# Patient Record
Sex: Male | Born: 1955 | Race: White | Hispanic: No | Marital: Married | Smoking: Current every day smoker
Health system: Southern US, Community
[De-identification: ages and names within clinical notes are randomized; demographics above are authoritative.]

## PROBLEM LIST (undated history)

## (undated) DIAGNOSIS — M1990 Unspecified osteoarthritis, unspecified site: Secondary | ICD-10-CM

## (undated) DIAGNOSIS — F419 Anxiety disorder, unspecified: Secondary | ICD-10-CM

## (undated) DIAGNOSIS — I10 Essential (primary) hypertension: Secondary | ICD-10-CM

## (undated) DIAGNOSIS — R002 Palpitations: Secondary | ICD-10-CM

## (undated) DIAGNOSIS — Z8619 Personal history of other infectious and parasitic diseases: Secondary | ICD-10-CM

## (undated) DIAGNOSIS — R079 Chest pain, unspecified: Secondary | ICD-10-CM

## (undated) HISTORY — DX: Anxiety disorder, unspecified: F41.9

## (undated) HISTORY — DX: Essential (primary) hypertension: I10

## (undated) HISTORY — DX: Palpitations: R00.2

## (undated) HISTORY — DX: Personal history of other infectious and parasitic diseases: Z86.19

## (undated) HISTORY — DX: Chest pain, unspecified: R07.9

## (undated) HISTORY — DX: Unspecified osteoarthritis, unspecified site: M19.90

---

## 2003-02-15 HISTORY — PX: CHOLECYSTECTOMY: SHX55

## 2012-02-12 ENCOUNTER — Encounter: Payer: Self-pay | Admitting: Cardiology

## 2012-02-16 ENCOUNTER — Telehealth: Payer: Self-pay | Admitting: *Deleted

## 2012-02-16 ENCOUNTER — Encounter: Payer: Self-pay | Admitting: *Deleted

## 2012-02-16 ENCOUNTER — Encounter: Payer: Self-pay | Admitting: Cardiology

## 2012-02-16 ENCOUNTER — Ambulatory Visit (INDEPENDENT_AMBULATORY_CARE_PROVIDER_SITE_OTHER): Payer: Commercial Indemnity | Admitting: Cardiology

## 2012-02-16 VITALS — BP 146/90 | HR 80 | Ht 72.0 in | Wt 211.0 lb

## 2012-02-16 DIAGNOSIS — I10 Essential (primary) hypertension: Secondary | ICD-10-CM

## 2012-02-16 DIAGNOSIS — R079 Chest pain, unspecified: Secondary | ICD-10-CM | POA: Insufficient documentation

## 2012-02-16 DIAGNOSIS — R002 Palpitations: Secondary | ICD-10-CM

## 2012-02-16 DIAGNOSIS — R7989 Other specified abnormal findings of blood chemistry: Secondary | ICD-10-CM

## 2012-02-16 MED ORDER — METOPROLOL SUCCINATE ER 25 MG PO TB24: 25.0000 mg | ORAL_TABLET | Freq: Every day | ORAL | Status: DC

## 2012-02-16 MED ORDER — ASPIRIN EC 81 MG PO TBEC: 81.0000 mg | DELAYED_RELEASE_TABLET | Freq: Every day | ORAL | Status: DC

## 2012-02-16 NOTE — Progress Notes (Signed)
  HPI: 57 year old male for evaluation of palpitations and near syncope. Seen in the emergency room on December 29 following an episode. At that time electrocardiogram was normal. Drug screen negative. Potassium 4.2. BUN and creatinine normal. SGOT 55 and SGPT 75. Patient states he was driving and suddenly felt lightheaded. He then felt his heart racing and some nausea. No dyspnea or chest pain. He states he felt as though he may pass out. EMS was called after he pulled over and he was taken to the hospital. His symptoms had resolved by the time he arrived. He had one similar episode 9 years ago. He otherwise denies dyspnea on exertion, orthopnea, PND, pedal edema or history of syncope. He does state that for the past 2 years he notices chest tightness after walking approximately 1 mile relieved with rest.  Current Outpatient Prescriptions  Medication Sig Dispense Refill  . clonazePAM (KLONOPIN) 0.5 MG tablet Take 0.5 mg by mouth 2 (two) times daily.        No Known Allergies  Past Medical History  Diagnosis Date  . Hypertension     Past Surgical History  Procedure Date  . Cholecystectomy     History   Social History  . Marital Status: Married    Spouse Name: N/A    Number of Children: 2  . Years of Education: N/A   Occupational History  . UNEMPLOYED    Social History Main Topics  . Smoking status: Former Smoker -- 1.5 packs/day for 30 years    Types: Cigarettes    Quit date: 02/14/2002  . Smokeless tobacco: Not on file  . Alcohol Use: Yes     Comment: Rare  . Drug Use: No  . Sexually Active: Not on file   Other Topics Concern  . Not on file   Social History Narrative  . No narrative on file    Family History  Problem Relation Age of Onset  . Heart disease      No family history    ROS: no fevers or chills, productive cough, hemoptysis, dysphasia, odynophagia, melena, hematochezia, dysuria, hematuria, rash, seizure activity, orthopnea, PND, pedal edema,  claudication. Remaining systems are negative.  Physical Exam:   Blood pressure 146/90, pulse 80, height 6' (1.829 m), weight 211 lb (95.709 kg), SpO2 98.00%.  General:  Well developed/well nourished in NAD Skin warm/dry Patient not depressed No peripheral clubbing Back-normal HEENT-normal/normal eyelids Neck supple/normal carotid upstroke bilaterally; no bruits; no JVD; no thyromegaly chest - CTA/ normal expansion CV - RRR/normal S1 and S2; no murmurs, rubs or gallops;  PMI nondisplaced Abdomen -NT/ND, no HSM, no mass, + bowel sounds, no bruit 2+ femoral pulses, no bruits Ext-no edema, chords, 2+ DP Neuro-grossly nonfocal  ECG 02/12/2012-sinus rhythm with no ST changes.

## 2012-02-16 NOTE — Patient Instructions (Signed)
   Echo   Stress Echo  Office will contact with results   Begin Aspirin 81mg  daily  Begin Toprol XL 25mg  daily  Lab for TSH (thyroid)  Need to follow up with primary MD regarding elevated liver function  Follow up in  6-8 weeks

## 2012-02-16 NOTE — Assessment & Plan Note (Signed)
Plan echocardiogram and TSH. I discussed a CardioNet the patient declined. We will consider this in the future if he has recurrent episodes.

## 2012-02-16 NOTE — Assessment & Plan Note (Signed)
Blood pressure is mildly elevated. Add Toprol 25 mg daily for blood pressure and palpitations.

## 2012-02-16 NOTE — Telephone Encounter (Signed)
Patient seen in office today.  Wants to know if we would refill his Clonazepam.  Was given 0.5mg  twice a day  - # 6 given at ED.  States this med helped him relax and sleep.  Does not currently have a PMD.

## 2012-02-16 NOTE — Assessment & Plan Note (Signed)
Patient encouraged to followup with primary care concerning this issue.

## 2012-02-16 NOTE — Assessment & Plan Note (Signed)
Patient describes some chest pain with ambulating after 1 mile. Symptoms are concerning for coronary disease but had been present for approximately 2 years. I have asked him to take aspirin 81 mg daily. We will arrange a stress echocardiogram for risk stratification.

## 2012-02-16 NOTE — Telephone Encounter (Signed)
Patient needs primary care physician and should get this med from him Olga Millers

## 2012-02-17 ENCOUNTER — Other Ambulatory Visit: Payer: Self-pay | Admitting: *Deleted

## 2012-02-17 ENCOUNTER — Telehealth: Payer: Self-pay | Admitting: Cardiology

## 2012-02-17 DIAGNOSIS — R079 Chest pain, unspecified: Secondary | ICD-10-CM

## 2012-02-17 NOTE — Telephone Encounter (Signed)
Pt has [employer].  No precert for stress echo.

## 2012-02-17 NOTE — Telephone Encounter (Signed)
Patient notified

## 2012-02-17 NOTE — Telephone Encounter (Signed)
Stress Echo set for 03-05-2012 [facility] Checking percert

## 2012-02-17 NOTE — Telephone Encounter (Signed)
Left message to return call 

## 2012-02-20 ENCOUNTER — Other Ambulatory Visit: Payer: Self-pay | Admitting: *Deleted

## 2012-02-20 ENCOUNTER — Encounter: Payer: Self-pay | Admitting: *Deleted

## 2012-02-20 DIAGNOSIS — R002 Palpitations: Secondary | ICD-10-CM

## 2012-02-23 DIAGNOSIS — R002 Palpitations: Secondary | ICD-10-CM

## 2012-03-01 ENCOUNTER — Other Ambulatory Visit (INDEPENDENT_AMBULATORY_CARE_PROVIDER_SITE_OTHER): Payer: Commercial Indemnity

## 2012-03-01 ENCOUNTER — Other Ambulatory Visit: Payer: Self-pay

## 2012-03-01 DIAGNOSIS — R002 Palpitations: Secondary | ICD-10-CM

## 2012-03-01 DIAGNOSIS — R079 Chest pain, unspecified: Secondary | ICD-10-CM

## 2012-03-02 ENCOUNTER — Telehealth: Payer: Self-pay | Admitting: *Deleted

## 2012-03-02 NOTE — Telephone Encounter (Signed)
Notes Recorded by Lesle Chris, LPN on 07/19/5407 at 11:31 AM Patient notified.

## 2012-03-02 NOTE — Telephone Encounter (Signed)
Message copied by Lesle Chris on Fri Mar 02, 2012 11:31 AM ------      Message from: Lewayne Bunting      Created: Fri Mar 02, 2012  7:41 AM       Hazle Coca

## 2012-03-05 DIAGNOSIS — R079 Chest pain, unspecified: Secondary | ICD-10-CM

## 2012-03-06 ENCOUNTER — Encounter: Payer: Self-pay | Admitting: *Deleted

## 2012-03-15 ENCOUNTER — Ambulatory Visit (INDEPENDENT_AMBULATORY_CARE_PROVIDER_SITE_OTHER): Payer: Commercial Indemnity | Admitting: Physician Assistant

## 2012-03-15 ENCOUNTER — Encounter: Payer: Self-pay | Admitting: Physician Assistant

## 2012-03-15 VITALS — BP 136/78 | HR 80 | Ht 72.0 in | Wt 213.1 lb

## 2012-03-15 DIAGNOSIS — I10 Essential (primary) hypertension: Secondary | ICD-10-CM

## 2012-03-15 DIAGNOSIS — R079 Chest pain, unspecified: Secondary | ICD-10-CM

## 2012-03-15 DIAGNOSIS — R002 Palpitations: Secondary | ICD-10-CM

## 2012-03-15 MED ORDER — METOPROLOL SUCCINATE ER 25 MG PO TB24: 25.0000 mg | ORAL_TABLET | Freq: Two times a day (BID) | ORAL | Status: DC

## 2012-03-15 NOTE — Assessment & Plan Note (Signed)
Will followup on recently completed 21 day event monitor, and await the final summary report. We reviewed some recorded strips, all indicating NSR with no dysrhythmia. If the rest of the study is also negative, then no further workup is indicated and patient will return to our clinic on an as-needed basis. Meanwhile, I recommended increasing current Toprol dose to 25 twice a day, given that he was on this medication on the day of admission, when he had a particularly intense episode of sustained palpitations.

## 2012-03-15 NOTE — Assessment & Plan Note (Signed)
Improved since last OV, following addition of beta blocker.

## 2012-03-15 NOTE — Assessment & Plan Note (Signed)
No further workup indicated. Patient has had recent extensive noninvasive workup, consisting of baseline echocardiogram and an exercise stress echocardiogram, both normal. Moreover, he  had normal cardiac markers during recent brief hospitalization.

## 2012-03-15 NOTE — Progress Notes (Signed)
Primary Cardiologist: Olga Millers, MD    HPI: Patient presents for scheduled followup and review of recent diagnostic studies, per Dr. Jens Som, for evaluation of palpitations, near syncope, and chest tightness. He declined recommendation to wear an event monitor.   - 2-D echocardiogram: EF 66%; no WMAs   - Normal adequate GXT echocardiogram   - NL TSH  Since his recent OV, patient was briefly hospitalized at [facility] for symptomatic palpitations. We were called for formal consultation, and noted no documented dysrhythmia. Patient also reported associated near-syncope and CP, and ruled out for MI. He also agreed to proceed with our initial recommendation for a 21 day event monitor. He just completed this yesterday.  No Known Allergies  Current Outpatient Prescriptions  Medication Sig Dispense Refill  . aspirin EC 81 MG tablet Take 1 tablet (81 mg total) by mouth daily.      . clonazePAM (KLONOPIN) 0.5 MG tablet Take 0.5 mg by mouth 2 (two) times daily.      . metoprolol succinate (TOPROL-XL) 25 MG 24 hr tablet Take 1 tablet (25 mg total) by mouth daily.  30 tablet  6    Past Medical History  Diagnosis Date  . Hypertension   . Palpitations   . Chest pain     Past Surgical History  Procedure Date  . Cholecystectomy     History   Social History  . Marital Status: Married    Spouse Name: N/A    Number of Children: 2  . Years of Education: N/A   Occupational History  . UNEMPLOYED    Social History Main Topics  . Smoking status: Former Smoker -- 1.5 packs/day for 30 years    Types: Cigarettes    Quit date: 02/14/2002  . Smokeless tobacco: Not on file  . Alcohol Use: Yes     Comment: Rare  . Drug Use: No  . Sexually Active: Not on file   Other Topics Concern  . Not on file   Social History Narrative  . No narrative on file    Family History  Problem Relation Age of Onset  . Heart disease      No family history    ROS: no nausea, vomiting; no fever, chills;  no melena, hematochezia; no claudication  PHYSICAL EXAM: BP 136/78  Pulse 80  Ht 6' (1.829 m)  Wt 213 lb 1.9 oz (96.671 kg)  BMI 28.90 kg/m2  SpO2 99% GENERAL: 57 year old male; NAD HEENT: NCAT, PERRLA, EOMI; sclera clear; no xanthelasma NECK: palpable bilateral carotid pulses, no bruits; no JVD; no TM LUNGS: CTA bilaterally CARDIAC: RRR (S1, S2); no significant murmurs; no rubs or gallops ABDOMEN: soft, non-tender; intact BS EXTREMETIES: intact distal pulses; no significant peripheral edema SKIN: warm/dry; no obvious rash/lesions MUSCULOSKELETAL: no joint deformity NEURO: no focal deficit; NL affect   EKG:    ASSESSMENT & PLAN:  Palpitations Will followup on recently completed 21 day event monitor, and await the final summary report. We reviewed some recorded strips, all indicating NSR with no dysrhythmia. If the rest of the study is also negative, then no further workup is indicated and patient will return to our clinic on an as-needed basis. Meanwhile, I recommended increasing current Toprol dose to 25 twice a day, given that he was on this medication on the day of admission, when he had a particularly intense episode of sustained palpitations.  Chest pain No further workup indicated. Patient has had recent extensive noninvasive workup, consisting of baseline echocardiogram and an exercise  stress echocardiogram, both normal. Moreover, he  had normal cardiac markers during recent brief hospitalization.  Essential hypertension Improved since last OV, following addition of beta blocker.    Gene Aleece Loyd, PAC

## 2012-03-15 NOTE — Patient Instructions (Signed)
   Increase Toprol XL to 25mg  twice a day   Continue all other current medications. Follow up as needed

## 2012-03-23 ENCOUNTER — Telehealth: Payer: Self-pay | Admitting: Physician Assistant

## 2012-03-23 NOTE — Telephone Encounter (Signed)
Discussed below with wife.  States that he has not been feeling good since recent increase on his Toprol XL 25mg  twice a day.  Nausea x last 4 days, elevated BP, and c/o heart feeling like it is flipping.  BP while on the phone - 211/97  78.  Today he has been clammy, pale, and just c/o not feeling right.  Advised her to take to ED for evaluation as acute symptoms should not be handled in the office.  She verbalized understanding.

## 2012-03-23 NOTE — Telephone Encounter (Signed)
Mr. Diesel thinks his heart is fluttering and states that he has been nausea.

## 2012-07-18 ENCOUNTER — Ambulatory Visit: Payer: Self-pay | Admitting: Nurse Practitioner

## 2012-08-24 ENCOUNTER — Encounter: Payer: Self-pay | Admitting: Nurse Practitioner

## 2012-08-24 ENCOUNTER — Ambulatory Visit (INDEPENDENT_AMBULATORY_CARE_PROVIDER_SITE_OTHER): Payer: Managed Care, Other (non HMO) | Admitting: Nurse Practitioner

## 2012-08-24 VITALS — BP 173/85 | HR 71 | Temp 97.4°F | Ht 72.0 in | Wt 208.0 lb

## 2012-08-24 DIAGNOSIS — R5383 Other fatigue: Secondary | ICD-10-CM

## 2012-08-24 DIAGNOSIS — I10 Essential (primary) hypertension: Secondary | ICD-10-CM

## 2012-08-24 DIAGNOSIS — B353 Tinea pedis: Secondary | ICD-10-CM

## 2012-08-24 DIAGNOSIS — R5381 Other malaise: Secondary | ICD-10-CM

## 2012-08-24 DIAGNOSIS — F410 Panic disorder [episodic paroxysmal anxiety] without agoraphobia: Secondary | ICD-10-CM

## 2012-08-24 MED ORDER — CLOTRIMAZOLE 1 % EX CREA: TOPICAL_CREAM | Freq: Two times a day (BID) | CUTANEOUS | Status: DC

## 2012-08-24 MED ORDER — METOPROLOL SUCCINATE ER 25 MG PO TB24: 25.0000 mg | ORAL_TABLET | Freq: Two times a day (BID) | ORAL | Status: DC

## 2012-08-24 MED ORDER — LISINOPRIL 40 MG PO TABS: 40.0000 mg | ORAL_TABLET | Freq: Every day | ORAL | Status: DC

## 2012-08-24 MED ORDER — CLONAZEPAM 0.5 MG PO TABS: 0.5000 mg | ORAL_TABLET | Freq: Two times a day (BID) | ORAL | Status: DC

## 2012-08-24 NOTE — Progress Notes (Signed)
  Subjective:    Patient ID: Stephen Fuller, male    DOB: [birth year], 56 y.o.   MRN: [id number]  Hypertension This is a chronic problem. The current episode started more than 1 year ago. The problem has been waxing and waning since onset. The problem is uncontrolled. Pertinent negatives include no blurred vision, chest pain, headaches, malaise/fatigue, orthopnea, palpitations, peripheral edema or shortness of breath. There are no associated agents to hypertension. Risk factors for coronary artery disease include obesity. Past treatments include beta blockers. The current treatment provides no improvement. Compliance problems include diet.    Panic attack Klonopin takes BID - says can't go without it.  Patient C/O fatigue  Review of Systems  Constitutional: Negative for malaise/fatigue.  Eyes: Negative for blurred vision.  Respiratory: Negative for shortness of breath.   Cardiovascular: Negative for chest pain, palpitations and orthopnea.  Neurological: Negative for headaches.  All other systems reviewed and are negative.       Objective:   Physical Exam  Constitutional: He is oriented to person, place, and time. He appears well-developed and well-nourished.  HENT:  Head: Normocephalic.  Right Ear: External ear normal.  Left Ear: External ear normal.  Nose: Nose normal.  Mouth/Throat: Oropharynx is clear and moist.  Eyes: EOM are normal. Pupils are equal, round, and reactive to light.  Neck: Normal range of motion. Neck supple. No thyromegaly present.  Cardiovascular: Normal rate, regular rhythm, normal heart sounds and intact distal pulses.   No murmur heard. Pulmonary/Chest: Effort normal and breath sounds normal. He has no wheezes. He has no rales.  Abdominal: Soft. Bowel sounds are normal.  Musculoskeletal: Normal range of motion.  Neurological: He is alert and oriented to person, place, and time.  Skin: Skin is warm and dry.  Erythematous cracked areas between toes bil  feet  Psychiatric: He has a normal mood and affect. His behavior is normal. Judgment and thought content normal.    BP 173/85  Pulse 71  Temp(Src) 97.4 F (36.3 C) (Oral)  Ht 6' (1.829 m)  Wt 208 lb (94.348 kg)  BMI 28.2 kg/m2       Assessment & Plan:  1. Hypertension Low Na= diet Added lisinopril to meds - metoprolol succinate (TOPROL-XL) 25 MG 24 hr tablet; Take 1 tablet (25 mg total) by mouth 2 (two) times daily.  Dispense: 60 tablet; Refill: 5 - lisinopril (PRINIVIL,ZESTRIL) 40 MG tablet; Take 1 tablet (40 mg total) by mouth daily.  Dispense: 30 tablet; Refill: 3  2. Panic attacks Stress management - clonazePAM (KLONOPIN) 0.5 MG tablet; Take 1 tablet (0.5 mg total) by mouth 2 (two) times daily.  Dispense: 60 tablet; Refill: 1  3. Tinea pedis Keep feet clean and dry Change socks when become moist - clotrimazole (LOTRIMIN) 1 % cream; Apply topically 2 (two) times daily.  Dispense: 30 g; Refill: 0  Mary-Margaret Daphine Deutscher, FNP

## 2012-08-24 NOTE — Patient Instructions (Signed)
Health Maintenance, Males A healthy lifestyle and preventative care can promote health and wellness.  Maintain regular health, dental, and eye exams.  Eat a healthy diet. Foods like vegetables, fruits, whole grains, low-fat dairy products, and lean protein foods contain the nutrients you need without too many calories. Decrease your intake of foods high in solid fats, added sugars, and salt. Get information about a proper diet from your caregiver, if necessary.  Regular physical exercise is one of the most important things you can do for your health. Most adults should get at least 150 minutes of moderate-intensity exercise (any activity that increases your heart rate and causes you to sweat) each week. In addition, most adults need muscle-strengthening exercises on 2 or more days a week.   Maintain a healthy weight. The body mass index (BMI) is a screening tool to identify possible weight problems. It provides an estimate of body fat based on height and weight. Your caregiver can help determine your BMI, and can help you achieve or maintain a healthy weight. For adults 20 years and older:  A BMI below 18.5 is considered underweight.  A BMI of 18.5 to 24.9 is normal.  A BMI of 25 to 29.9 is considered overweight.  A BMI of 30 and above is considered obese.  Maintain normal blood lipids and cholesterol by exercising and minimizing your intake of saturated fat. Eat a balanced diet with plenty of fruits and vegetables. Blood tests for lipids and cholesterol should begin at age 20 and be repeated every 5 years. If your lipid or cholesterol levels are high, you are over 50, or you are a high risk for heart disease, you may need your cholesterol levels checked more frequently.Ongoing high lipid and cholesterol levels should be treated with medicines, if diet and exercise are not effective.  If you smoke, find out from your caregiver how to quit. If you do not use tobacco, do not start.  If you  choose to drink alcohol, do not exceed 2 drinks per day. One drink is considered to be 12 ounces (355 mL) of beer, 5 ounces (148 mL) of wine, or 1.5 ounces (44 mL) of liquor.  Avoid use of street drugs. Do not share needles with anyone. Ask for help if you need support or instructions about stopping the use of drugs.  High blood pressure causes heart disease and increases the risk of stroke. Blood pressure should be checked at least every 1 to 2 years. Ongoing high blood pressure should be treated with medicines if weight loss and exercise are not effective.  If you are 45 to 56 years old, ask your caregiver if you should take aspirin to prevent heart disease.  Diabetes screening involves taking a blood sample to check your fasting blood sugar level. This should be done once every 3 years, after age 45, if you are within normal weight and without risk factors for diabetes. Testing should be considered at a younger age or be carried out more frequently if you are overweight and have at least 1 risk factor for diabetes.  Colorectal cancer can be detected and often prevented. Most routine colorectal cancer screening begins at the age of 50 and continues through age 75. However, your caregiver may recommend screening at an earlier age if you have risk factors for colon cancer. On a yearly basis, your caregiver may provide home test kits to check for hidden blood in the stool. Use of a small camera at the end of a tube,   to directly examine the colon (sigmoidoscopy or colonoscopy), can detect the earliest forms of colorectal cancer. Talk to your caregiver about this at age 50, when routine screening begins. Direct examination of the colon should be repeated every 5 to 10 years through age 75, unless early forms of pre-cancerous polyps or small growths are found.  Hepatitis C blood testing is recommended for all people born from 1945 through 1965 and any individual with known risks for hepatitis C.  Healthy  men should no longer receive prostate-specific antigen (PSA) blood tests as part of routine cancer screening. Consult with your caregiver about prostate cancer screening.  Testicular cancer screening is not recommended for adolescents or adult males who have no symptoms. Screening includes self-exam, caregiver exam, and other screening tests. Consult with your caregiver about any symptoms you have or any concerns you have about testicular cancer.  Practice safe sex. Use condoms and avoid high-risk sexual practices to reduce the spread of sexually transmitted infections (STIs).  Use sunscreen with a sun protection factor (SPF) of 30 or greater. Apply sunscreen liberally and repeatedly throughout the day. You should seek shade when your shadow is shorter than you. Protect yourself by wearing long sleeves, pants, a wide-brimmed hat, and sunglasses year round, whenever you are outdoors.  Notify your caregiver of new moles or changes in moles, especially if there is a change in shape or color. Also notify your caregiver if a mole is larger than the size of a pencil eraser.  A one-time screening for abdominal aortic aneurysm (AAA) and surgical repair of large AAAs by sound wave imaging (ultrasonography) is recommended for ages 65 to 75 years who are current or former smokers.  Stay current with your immunizations. Document Released: 07/30/2007 Document Revised: 04/25/2011 Document Reviewed: 06/28/2010 [employer] Patient Information 2014 [employer].  

## 2012-08-27 ENCOUNTER — Telehealth: Payer: Self-pay | Admitting: Nurse Practitioner

## 2012-08-27 DIAGNOSIS — F410 Panic disorder [episodic paroxysmal anxiety] without agoraphobia: Secondary | ICD-10-CM

## 2012-08-27 MED ORDER — CLONAZEPAM 0.5 MG PO TABS: 0.5000 mg | ORAL_TABLET | Freq: Two times a day (BID) | ORAL | Status: DC

## 2012-08-27 NOTE — Telephone Encounter (Signed)
Pleas call in Klonopin with 1 refill

## 2012-08-27 NOTE — Telephone Encounter (Signed)
Please advise 

## 2012-08-27 NOTE — Telephone Encounter (Signed)
Called to [facility]. 

## 2012-08-28 ENCOUNTER — Other Ambulatory Visit (INDEPENDENT_AMBULATORY_CARE_PROVIDER_SITE_OTHER): Payer: Managed Care, Other (non HMO)

## 2012-08-28 DIAGNOSIS — Z0000 Encounter for general adult medical examination without abnormal findings: Secondary | ICD-10-CM

## 2012-08-28 DIAGNOSIS — I10 Essential (primary) hypertension: Secondary | ICD-10-CM

## 2012-08-29 LAB — NMR LIPOPROFILE WITH LIPIDS
HDL Particle Number: 38.2 umol/L (ref >=30.5)
Large HDL-P: 15.8 umol/L (ref >=4.8)
Small LDL Particle Number: 144 nmol/L (ref <=527)

## 2012-08-29 LAB — COMPLETE METABOLIC PANEL WITH GFR
ALT: 61 U/L — ABNORMAL HIGH (ref 0–53)
CO2: 27 mEq/L (ref 19–32)
Calcium: 10 mg/dL (ref 8.4–10.5)
Chloride: 106 mEq/L (ref 96–112)
Creat: 1.29 mg/dL (ref 0.50–1.35)
GFR, Est African American: 71 mL/min
Glucose, Bld: 86 mg/dL (ref 70–99)
Total Protein: 7 g/dL (ref 6.0–8.3)

## 2012-08-29 LAB — THYROID PANEL WITH TSH
Free Thyroxine Index: 2.6 (ref 1.0–3.9)
TSH: 1.717 u[IU]/mL (ref 0.350–4.500)

## 2012-08-31 ENCOUNTER — Other Ambulatory Visit: Payer: Managed Care, Other (non HMO)

## 2012-11-05 ENCOUNTER — Other Ambulatory Visit: Payer: Self-pay

## 2012-11-05 DIAGNOSIS — F410 Panic disorder [episodic paroxysmal anxiety] without agoraphobia: Secondary | ICD-10-CM

## 2012-11-05 NOTE — Telephone Encounter (Signed)
Last seen 08/24/12  MMM   If approved route to nurse to phone into [facility]  [phone]

## 2012-11-06 MED ORDER — CLONAZEPAM 0.5 MG PO TABS: 0.5000 mg | ORAL_TABLET | Freq: Two times a day (BID) | ORAL | Status: DC

## 2012-11-06 NOTE — Telephone Encounter (Signed)
Please call in clonazepam rx with 1 refill 

## 2012-11-08 ENCOUNTER — Telehealth: Payer: Self-pay | Admitting: Nurse Practitioner

## 2012-11-08 NOTE — Telephone Encounter (Signed)
Called into [facility]

## 2012-12-26 ENCOUNTER — Other Ambulatory Visit: Payer: Self-pay | Admitting: Nurse Practitioner

## 2013-01-08 ENCOUNTER — Other Ambulatory Visit: Payer: Self-pay

## 2013-01-08 DIAGNOSIS — F410 Panic disorder [episodic paroxysmal anxiety] without agoraphobia: Secondary | ICD-10-CM

## 2013-01-08 MED ORDER — CLONAZEPAM 0.5 MG PO TABS: 0.5000 mg | ORAL_TABLET | Freq: Two times a day (BID) | ORAL | Status: DC

## 2013-01-08 NOTE — Telephone Encounter (Signed)
Last seen 08/24/12  MMM   If approved route to nurse to call into [facility]  [phone] 

## 2013-01-08 NOTE — Telephone Encounter (Signed)
Please call in clonazepam rx 

## 2013-01-09 NOTE — Telephone Encounter (Signed)
Done

## 2013-01-14 ENCOUNTER — Other Ambulatory Visit: Payer: Self-pay

## 2013-01-14 DIAGNOSIS — F410 Panic disorder [episodic paroxysmal anxiety] without agoraphobia: Secondary | ICD-10-CM

## 2013-01-14 MED ORDER — CLONAZEPAM 0.5 MG PO TABS: 0.5000 mg | ORAL_TABLET | Freq: Two times a day (BID) | ORAL | Status: DC

## 2013-01-14 NOTE — Telephone Encounter (Signed)
Called into [facility]  

## 2013-01-14 NOTE — Telephone Encounter (Signed)
Last seen 08/24/12  MMM   If approved route to nurse to call into [facility]  [phone]

## 2013-01-14 NOTE — Telephone Encounter (Signed)
Please call in klonopin rx 

## 2013-01-23 ENCOUNTER — Other Ambulatory Visit: Payer: Self-pay | Admitting: Nurse Practitioner

## 2013-01-28 ENCOUNTER — Encounter: Payer: Self-pay | Admitting: Cardiovascular Disease

## 2013-01-28 ENCOUNTER — Encounter: Payer: Self-pay | Admitting: *Deleted

## 2013-01-28 ENCOUNTER — Ambulatory Visit (INDEPENDENT_AMBULATORY_CARE_PROVIDER_SITE_OTHER): Payer: Commercial Indemnity | Admitting: Cardiovascular Disease

## 2013-01-28 VITALS — BP 138/78 | HR 88 | Ht 72.0 in | Wt 203.0 lb

## 2013-01-28 DIAGNOSIS — R079 Chest pain, unspecified: Secondary | ICD-10-CM

## 2013-01-28 DIAGNOSIS — R0602 Shortness of breath: Secondary | ICD-10-CM | POA: Insufficient documentation

## 2013-01-28 DIAGNOSIS — R002 Palpitations: Secondary | ICD-10-CM

## 2013-01-28 DIAGNOSIS — I10 Essential (primary) hypertension: Secondary | ICD-10-CM

## 2013-01-28 MED ORDER — METOPROLOL SUCCINATE ER 25 MG PO TB24: ORAL_TABLET | ORAL | Status: DC

## 2013-01-28 MED ORDER — ISOSORBIDE DINITRATE 10 MG PO TABS: 10.0000 mg | ORAL_TABLET | Freq: Three times a day (TID) | ORAL | Status: DC

## 2013-01-28 NOTE — Progress Notes (Signed)
Patient ID: Stephen Fuller, male   DOB: March 05, 1955, 57 y.o.   MRN: [id number]      SUBJECTIVE: The patient is a 57 year old male with a history of panic attacks and takes Klonopin for this. He also has hypertension for which he takes lisinopril. He's been evaluated in the past (approximately one year ago) for chest pain and palpitations and had a normal 21 day event monitor as well as a normal echocardiogram and stress test. For the past 6 months, he has experienced episodic chest pain with exertion, accompanied by shortness of breath. However, it has become more frequent in the past 2 months and experiences it daily. He experiences exertional chest pain after walking 1 block or climbing 1 flight of stairs. He has associated dizziness and lightheadedness, but denies syncope. He awakes at night with palpitations. He denies orthopnea and PND, along with leg swelling. He says, "I know something is wrong but I just don't know what it is". He tried using some old Symbicort he had but this did not help. He denies recent panic attacks.   No Known Allergies  Current Outpatient Prescriptions  Medication Sig Dispense Refill  . aspirin EC 81 MG tablet Take 1 tablet (81 mg total) by mouth daily.      . clonazePAM (KLONOPIN) 0.5 MG tablet Take 1 tablet (0.5 mg total) by mouth 2 (two) times daily.  60 tablet  0  . clotrimazole (LOTRIMIN) 1 % cream Apply topically 2 (two) times daily.  30 g  0  . lisinopril (PRINIVIL,ZESTRIL) 40 MG tablet TAKE (1) TABLET BY MOUTH ONCE DAILY.  30 tablet  2  . metoprolol succinate (TOPROL-XL) 25 MG 24 hr tablet Take 1 tablet (25 mg total) by mouth 2 (two) times daily.  60 tablet  5   No current facility-administered medications for this visit.    Past Medical History  Diagnosis Date  . Hypertension   . Palpitations   . Chest pain     Past Surgical History  Procedure Laterality Date  . Cholecystectomy      History   Social History  . Marital Status: Married   Spouse Name: N/A    Number of Children: 2  . Years of Education: N/A   Occupational History  . UNEMPLOYED    Social History Main Topics  . Smoking status: Former Smoker -- 1.50 packs/day for 30 years    Types: Cigarettes    Quit date: 02/14/2002  . Smokeless tobacco: Not on file  . Alcohol Use: Yes     Comment: Rare  . Drug Use: No  . Sexual Activity: Not on file   Other Topics Concern  . Not on file   Social History Narrative  . No narrative on file     BP 138/78  Pulse 88   PHYSICAL EXAM General: NAD Neck: No JVD, no thyromegaly or thyroid nodule.  Lungs: Clear to auscultation bilaterally with normal respiratory effort. CV: Nondisplaced PMI.  Heart regular S1/S2, no S3/S4, no murmur.  No peripheral edema.  No carotid bruit.  Normal pedal pulses.  Abdomen: Soft, nontender, no hepatosplenomegaly, no distention.  Neurologic: Alert and oriented x 3.  Psych: Normal affect. Extremities: No clubbing or cyanosis.   ECG: reviewed and available in electronic records. (NSR, 83 bpm, no ST-T abnormalities)      ASSESSMENT AND PLAN: 1. Chest pain and shortness of breath with palpitations: given that it has been one year since his last stress test, I will proceed with  another stress echocardiogram to assess for interval development of inducible ischemia. I will also increase his Toprol-XL dosage from 25 mg bid to 50 mg q am and 25 mg q pm. I will also start isosorbide dinitrate 10 mg tid. I would not rule out the possibility of coronary angiography to definitively exclude coronary artery disease, should optimal medical therapy fail to control his symptoms. 2. HTN: controlled on present therapy.  Dispo: f/u 3-4 weeks.   Prentice Docker, M.D., F.A.C.C.

## 2013-01-28 NOTE — Patient Instructions (Addendum)
Your physician recommends that you schedule a follow-up appointment in: 3-4 WEEKS  Your physician has requested that you have a stress echocardiogram. For further information please visit [email or web address] Please follow instruction sheet as given. DO NOT TAKE YOUR TOPROL XL THE MORNING OF THE TEST  WE WILL CALL YOU WITH YOUR TEST RESULTS/INSTRUCTIONS/NEXT STEPS ONCE RECEIVED BY THE PROVIDER  PLEASE BE ADVISED YOU WILL STILL NEED TO KEEP YOUR FOLLOW UP APPOINTMENT TO DISCUSS FURTHER DETAILS OF YOUR TEST RESULTS/NEXT STEPS/FUTURE PLAN OF CARE WITH YOUR PROVIDER DESPITE THE FACT THAT YOU MAY HAVE NORMAL TEST RESULTS   Your physician has recommended you make the following change in your medication:   1) INCREASE TOPROL XL TO 50MG  IN THE AM AND 25MG  IN THE PM 2) START TAKING ISOSORBIDE 10MG  THREE TIMES DAILY

## 2013-02-13 ENCOUNTER — Ambulatory Visit (HOSPITAL_COMMUNITY)
Admission: RE | Admit: 2013-02-13 | Discharge: 2013-02-13 | Disposition: A | Discharge status: Home / Self Care | Payer: Commercial Indemnity | Source: Ambulatory Visit | Attending: Cardiovascular Disease | Admitting: Cardiovascular Disease

## 2013-02-13 ENCOUNTER — Encounter (HOSPITAL_COMMUNITY): Payer: Self-pay

## 2013-02-13 DIAGNOSIS — R072 Precordial pain: Secondary | ICD-10-CM

## 2013-02-13 DIAGNOSIS — R0609 Other forms of dyspnea: Secondary | ICD-10-CM | POA: Insufficient documentation

## 2013-02-13 DIAGNOSIS — R002 Palpitations: Secondary | ICD-10-CM

## 2013-02-13 DIAGNOSIS — R0989 Other specified symptoms and signs involving the circulatory and respiratory systems: Secondary | ICD-10-CM | POA: Insufficient documentation

## 2013-02-13 DIAGNOSIS — R0602 Shortness of breath: Secondary | ICD-10-CM

## 2013-02-13 DIAGNOSIS — R079 Chest pain, unspecified: Secondary | ICD-10-CM

## 2013-02-13 NOTE — Progress Notes (Signed)
*  PRELIMINARY RESULTS* Echocardiogram Echocardiogram Stress Test has been performed.  Stephen Fuller 02/13/2013, 11:12 AM

## 2013-02-13 NOTE — Progress Notes (Signed)
Stress Lab Nurses Notes - [facility]  Stephen Fuller 02/13/2013 Reason for doing test: Chest Pain, Dyspnea and palpitation Type of test: Stress Echo Nurse performing test: Parke Poisson, RN Nuclear Medicine Tech: Not Applicable Echo Tech: Karrie Doffing MD performing test: Ival Bible Family MD: Dr. Christell Constant Test explained and consent signed: yes IV started: No IV started Symptoms: SOB & Chest pain Treatment/Intervention: None Reason test stopped: fatigue and reached target HR After recovery IV was: NA Patient to return to Nuc. Med at :NA Patient discharged: Home Patient's Condition upon discharge was: stable Comments: During test peak BP 223/83 & HR 142. Recovery BP 141/71 & HR 75.  Symptom resolved in recovery. Stephen Fuller

## 2013-02-22 ENCOUNTER — Ambulatory Visit (INDEPENDENT_AMBULATORY_CARE_PROVIDER_SITE_OTHER): Payer: Commercial Indemnity | Admitting: Cardiovascular Disease

## 2013-02-22 ENCOUNTER — Encounter: Payer: Self-pay | Admitting: Cardiovascular Disease

## 2013-02-22 VITALS — BP 131/63 | HR 66 | Ht 72.0 in | Wt 205.0 lb

## 2013-02-22 DIAGNOSIS — Z136 Encounter for screening for cardiovascular disorders: Secondary | ICD-10-CM

## 2013-02-22 DIAGNOSIS — R079 Chest pain, unspecified: Secondary | ICD-10-CM

## 2013-02-22 DIAGNOSIS — F17200 Nicotine dependence, unspecified, uncomplicated: Secondary | ICD-10-CM

## 2013-02-22 DIAGNOSIS — R0602 Shortness of breath: Secondary | ICD-10-CM

## 2013-02-22 DIAGNOSIS — Z716 Tobacco abuse counseling: Secondary | ICD-10-CM

## 2013-02-22 DIAGNOSIS — IMO0001 Reserved for inherently not codable concepts without codable children: Secondary | ICD-10-CM

## 2013-02-22 DIAGNOSIS — Z7189 Other specified counseling: Secondary | ICD-10-CM

## 2013-02-22 DIAGNOSIS — R002 Palpitations: Secondary | ICD-10-CM

## 2013-02-22 NOTE — Patient Instructions (Addendum)
Your physician recommends that you schedule a follow-up appointment in: 4 months You will receive a reminder letter two months in advance reminding you to call and schedule your appointment. If you don't receive this letter, please contact our office.  Your physician recommends that you continue on your current medications as directed. Please refer to the Current Medication list given to you today.   

## 2013-02-22 NOTE — Progress Notes (Signed)
Patient ID: Stephen SanfilippoJohnnie R Fuller, male   DOB: [birth year], 58 y.o.   MRN: [id number]      SUBJECTIVE: The patient is here to followup on the results of cardiovascular testing performed for the evaluation of chest pain. He underwent an entirely normal stress echocardiogram with a Duke treadmill score of 10 predicting a low risk for future adverse cardiac events. After increasing his metoprolol and starting him on Isordil at his last visit, he no longer has any chest pain. Unfortunately, he has picked up smoking again after having quit for 10 years.   No Known Allergies  Current Outpatient Prescriptions  Medication Sig Dispense Refill  . aspirin EC 81 MG tablet Take 1 tablet (81 mg total) by mouth daily.      . clonazePAM (KLONOPIN) 0.5 MG tablet Take 1 tablet (0.5 mg total) by mouth 2 (two) times daily.  60 tablet  0  . clotrimazole (LOTRIMIN) 1 % cream Apply topically 2 (two) times daily.  30 g  0  . isosorbide dinitrate (ISORDIL) 10 MG tablet Take 1 tablet (10 mg total) by mouth 3 (three) times daily.  90 tablet  6  . lisinopril (PRINIVIL,ZESTRIL) 40 MG tablet TAKE (1) TABLET BY MOUTH ONCE DAILY.  30 tablet  2  . metoprolol succinate (TOPROL-XL) 25 MG 24 hr tablet TAKE 50MG  IN THE AM AND 25MG  IN THE PM  90 tablet  6   No current facility-administered medications for this visit.    Past Medical History  Diagnosis Date  . Hypertension   . Palpitations   . Chest pain     Past Surgical History  Procedure Laterality Date  . Cholecystectomy      History   Social History  . Marital Status: Married    Spouse Name: N/A    Number of Children: 2  . Years of Education: N/A   Occupational History  . UNEMPLOYED    Social History Main Topics  . Smoking status: Former Smoker -- 1.50 packs/day for 30 years    Types: Cigarettes    Quit date: 02/14/2002  . Smokeless tobacco: Not on file  . Alcohol Use: Yes     Comment: Rare  . Drug Use: No  . Sexual Activity: Not on file   Other Topics  Concern  . Not on file   Social History Narrative  . No narrative on file     Filed Vitals:   02/22/13 1529  BP: 131/63  Pulse: 66  Height: 6' (1.829 m)  Weight: 205 lb (92.987 kg)    PHYSICAL EXAM General: NAD Neck: No JVD, no thyromegaly or thyroid nodule.  Lungs: Clear to auscultation bilaterally with normal respiratory effort. CV: Nondisplaced PMI.  Heart regular S1/S2, no S3/S4, no murmur.  No peripheral edema.  No carotid bruit.  Normal pedal pulses.  Abdomen: Soft, nontender, no hepatosplenomegaly, no distention.  Neurologic: Alert and oriented x 3.  Psych: Normal affect. Extremities: No clubbing or cyanosis.   ECG: reviewed and available in electronic records.      ASSESSMENT AND PLAN: 1. Chest pain and shortness of breath with palpitations:given that his stress echocardiogram was negative for nducible ischemia, no further cardiovascular testing is indicated at this time. The increase of his Toprol-XL dosage from 25 mg bid to 50 mg q am and 25 mg q pm at his last visit, and the addition of isosorbide dinitrate 10 mg tid has alleviated his symptoms. I would not rule out the possibility of coronary angiography to  definitively exclude coronary artery disease, should optimal medical therapy fail to control his symptoms.  2. HTN: controlled on present therapy. 3. Tobacco abuse: cessation counseling given.  Dispo: f/u 4 months.  Prentice Docker, M.D., F.A.C.C.

## 2013-02-23 ENCOUNTER — Other Ambulatory Visit: Payer: Self-pay | Admitting: Nurse Practitioner

## 2013-03-20 ENCOUNTER — Telehealth: Payer: Self-pay | Admitting: *Deleted

## 2013-03-20 MED ORDER — LISINOPRIL 40 MG PO TABS: ORAL_TABLET | ORAL | Status: DC

## 2013-03-20 NOTE — Telephone Encounter (Signed)
Medication sent via escribe.  

## 2013-03-20 NOTE — Telephone Encounter (Signed)
Pt walkin to get Rx filled by Dr Purvis SheffieldKoneswaran for lisinopril 40 mg. Needs [facility] [phone] Rx [id number]

## 2013-04-17 ENCOUNTER — Ambulatory Visit (INDEPENDENT_AMBULATORY_CARE_PROVIDER_SITE_OTHER): Payer: Managed Care, Other (non HMO) | Admitting: Nurse Practitioner

## 2013-04-17 ENCOUNTER — Encounter: Payer: Self-pay | Admitting: Nurse Practitioner

## 2013-04-17 ENCOUNTER — Telehealth: Payer: Self-pay | Admitting: Nurse Practitioner

## 2013-04-17 VITALS — BP 136/70 | HR 62 | Temp 98.0°F | Ht 72.0 in | Wt 195.0 lb

## 2013-04-17 DIAGNOSIS — L03119 Cellulitis of unspecified part of limb: Secondary | ICD-10-CM

## 2013-04-17 DIAGNOSIS — L02419 Cutaneous abscess of limb, unspecified: Secondary | ICD-10-CM

## 2013-04-17 DIAGNOSIS — L03115 Cellulitis of right lower limb: Secondary | ICD-10-CM

## 2013-04-17 NOTE — Patient Instructions (Signed)
Cellulitis Cellulitis is an infection of the skin and the tissue beneath it. The infected area is usually red and tender. Cellulitis occurs most often in the arms and lower legs.  CAUSES  Cellulitis is caused by bacteria that enter the skin through cracks or cuts in the skin. The most common types of bacteria that cause cellulitis are Staphylococcus and Streptococcus. SYMPTOMS   Redness and warmth.  Swelling.  Tenderness or pain.  Fever. DIAGNOSIS  Your caregiver can usually determine what is wrong based on a physical exam. Blood tests may also be done. TREATMENT  Treatment usually involves taking an antibiotic medicine. HOME CARE INSTRUCTIONS   Take your antibiotics as directed. Finish them even if you start to feel better.  Keep the infected arm or leg elevated to reduce swelling.  Apply a warm cloth to the affected area up to 4 times per day to relieve pain.  Only take over-the-counter or prescription medicines for pain, discomfort, or fever as directed by your caregiver.  Keep all follow-up appointments as directed by your caregiver. SEEK MEDICAL CARE IF:   You notice red streaks coming from the infected area.  Your red area gets larger or turns dark in color.  Your bone or joint underneath the infected area becomes painful after the skin has healed.  Your infection returns in the same area or another area.  You notice a swollen bump in the infected area.  You develop new symptoms. SEEK IMMEDIATE MEDICAL CARE IF:   You have a fever.  You feel very sleepy.  You develop vomiting or diarrhea.  You have a general ill feeling (malaise) with muscle aches and pains. MAKE SURE YOU:   Understand these instructions.  Will watch your condition.  Will get help right away if you are not doing well or get worse. Document Released: 11/10/2004 Document Revised: 08/02/2011 Document Reviewed: 04/18/2011 Exit[employer] Patient Information 2014 [employer].  

## 2013-04-17 NOTE — Progress Notes (Signed)
   Subjective:    Patient ID: Stephen Fuller, male    DOB: [birth year], 57 y.o.   MRN: [id number]  HPI Patient presents today for hospital followup for cellulitis. Patient is currently receiving home health care to help patient to receive IV antibiotics. Patient is taking IV antibiotics twice day daily as well as oral Sulfmethoxazole-Trimethoprim oral BID daily. Patient does not recall name of IV antibiotic. States he is to finish his IV treatments today and has 3 days left of oral antibiotics. Patient states his condition has improved significantly. Denies SOB, chest pain, and fevers. He is now able to place weight on his knee and walk comfortably.   Review of Systems  Constitutional: Negative for fever and diaphoresis.  Respiratory: Negative for shortness of breath.   Cardiovascular: Negative for chest pain and palpitations.  Musculoskeletal: Negative for gait problem.  Neurological: Negative for dizziness and headaches.  All other systems reviewed and are negative.       Objective:   Physical Exam  Constitutional: He is oriented to person, place, and time. He appears well-developed and well-nourished.  HENT:  Head: Normocephalic.  Cardiovascular: Normal rate, regular rhythm and normal heart sounds.  Exam reveals no gallop and no friction rub.   No murmur heard. Pulmonary/Chest: Effort normal and breath sounds normal.  Musculoskeletal: Normal range of motion. He exhibits no edema.       Right knee: He exhibits swelling and erythema. He exhibits normal range of motion.  R. Knee - small amount of drainage, not warm to touch or tender IV present in R. Arm - no erythema present  Neurological: He is alert and oriented to person, place, and time.  Skin: Skin is warm and dry.  Psychiatric: He has a normal mood and affect. His behavior is normal. Judgment and thought content normal.   BP 136/70  Pulse 62  Temp(Src) 98 F (36.7 C) (Oral)  Ht 6' (1.829 m)  Wt 195 lb (88.451 kg)  BMI  26.44 kg/m2        Assessment & Plan:   1. Cellulitis of knee, right    Continue and complete current  treatment Keep R. Knee clean and dry Monitor IV site for infection (redness, tenderness, or warm to touch) RTC in 5 days for recheck  Mary-Margaret Daphine DeutscherMartin, FNP

## 2013-04-17 NOTE — Telephone Encounter (Signed)
Need [facility] records to see what culture grew to order more antibiotics

## 2013-04-22 ENCOUNTER — Encounter: Payer: Self-pay | Admitting: Nurse Practitioner

## 2013-04-22 ENCOUNTER — Ambulatory Visit (INDEPENDENT_AMBULATORY_CARE_PROVIDER_SITE_OTHER): Payer: Managed Care, Other (non HMO) | Admitting: Nurse Practitioner

## 2013-04-22 VITALS — BP 105/65 | HR 69 | Temp 97.8°F | Ht 72.0 in | Wt 197.0 lb

## 2013-04-22 DIAGNOSIS — L02419 Cutaneous abscess of limb, unspecified: Secondary | ICD-10-CM

## 2013-04-22 DIAGNOSIS — L03115 Cellulitis of right lower limb: Secondary | ICD-10-CM

## 2013-04-22 DIAGNOSIS — L03119 Cellulitis of unspecified part of limb: Secondary | ICD-10-CM

## 2013-04-22 MED ORDER — AZITHROMYCIN 250 MG PO TABS: ORAL_TABLET | ORAL | Status: DC

## 2013-04-22 NOTE — Patient Instructions (Signed)
Cellulitis Cellulitis is an infection of the skin and the tissue beneath it. The infected area is usually red and tender. Cellulitis occurs most often in the arms and lower legs.  CAUSES  Cellulitis is caused by bacteria that enter the skin through cracks or cuts in the skin. The most common types of bacteria that cause cellulitis are Staphylococcus and Streptococcus. SYMPTOMS   Redness and warmth.  Swelling.  Tenderness or pain.  Fever. DIAGNOSIS  Your caregiver can usually determine what is wrong based on a physical exam. Blood tests may also be done. TREATMENT  Treatment usually involves taking an antibiotic medicine. HOME CARE INSTRUCTIONS   Take your antibiotics as directed. Finish them even if you start to feel better.  Keep the infected arm or leg elevated to reduce swelling.  Apply a warm cloth to the affected area up to 4 times per day to relieve pain.  Only take over-the-counter or prescription medicines for pain, discomfort, or fever as directed by your caregiver.  Keep all follow-up appointments as directed by your caregiver. SEEK MEDICAL CARE IF:   You notice red streaks coming from the infected area.  Your red area gets larger or turns dark in color.  Your bone or joint underneath the infected area becomes painful after the skin has healed.  Your infection returns in the same area or another area.  You notice a swollen bump in the infected area.  You develop new symptoms. SEEK IMMEDIATE MEDICAL CARE IF:   You have a fever.  You feel very sleepy.  You develop vomiting or diarrhea.  You have a general ill feeling (malaise) with muscle aches and pains. MAKE SURE YOU:   Understand these instructions.  Will watch your condition.  Will get help right away if you are not doing well or get worse. Document Released: 11/10/2004 Document Revised: 08/02/2011 Document Reviewed: 04/18/2011 Exit[employer] Patient Information 2014 [employer].  

## 2013-04-22 NOTE — Progress Notes (Signed)
   Subjective:    Patient ID: Stephen Fuller, male    DOB: [birth year], 58 y.o.   MRN: [id number]  HPI Patient in c/o right knee cellulitis- has been on IV antibiotics and bactrim for 10 days- seems to be improving but still draining slightly. STill sore to the touch.    Review of Systems  Constitutional: Negative.   HENT: Negative.   Respiratory: Negative.   All other systems reviewed and are negative.       Objective:   Physical Exam  Constitutional: He appears well-developed and well-nourished.  Cardiovascular: Normal rate, regular rhythm and normal heart sounds.   Pulmonary/Chest: Effort normal and breath sounds normal.  Skin:  1cm raised mildly tender lesion right knee- scabbed in the center- no drainage  Psychiatric: He has a normal mood and affect. His behavior is normal. Judgment and thought content normal.   BP 105/65  Pulse 69  Temp(Src) 97.8 F (36.6 C) (Oral)  Ht 6' (1.829 m)  Wt 197 lb (89.359 kg)  BMI 26.71 kg/m2        Assessment & Plan:   1. Cellulitis of knee, right    Meds ordered this encounter  Medications  . azithromycin (ZITHROMAX Z-PAK) 250 MG tablet    Sig: As directed    Dispense:  6 each    Refill:  0    Order Specific Question:  Supervising Provider    Answer:  Deborra MedinaMOORE, DONALD W [[id number]]   Do not pick or scratch RTO if doesn't fully resolve  Mary-Margaret Daphine DeutscherMartin, FNP

## 2013-05-14 DIAGNOSIS — Z452 Encounter for adjustment and management of vascular access device: Secondary | ICD-10-CM

## 2013-05-14 DIAGNOSIS — S272XXA Traumatic hemopneumothorax, initial encounter: Secondary | ICD-10-CM

## 2013-05-14 DIAGNOSIS — I10 Essential (primary) hypertension: Secondary | ICD-10-CM

## 2013-05-14 DIAGNOSIS — L02419 Cutaneous abscess of limb, unspecified: Secondary | ICD-10-CM

## 2013-05-14 DIAGNOSIS — L03119 Cellulitis of unspecified part of limb: Secondary | ICD-10-CM

## 2013-06-26 ENCOUNTER — Ambulatory Visit: Payer: Commercial Indemnity | Admitting: Cardiovascular Disease

## 2013-08-02 ENCOUNTER — Other Ambulatory Visit: Payer: Self-pay | Admitting: Cardiovascular Disease

## 2013-08-13 ENCOUNTER — Ambulatory Visit (INDEPENDENT_AMBULATORY_CARE_PROVIDER_SITE_OTHER): Payer: Commercial Indemnity | Admitting: Cardiovascular Disease

## 2013-08-13 ENCOUNTER — Encounter: Payer: Self-pay | Admitting: Cardiovascular Disease

## 2013-08-13 VITALS — BP 132/74 | HR 54 | Ht 72.0 in | Wt 182.0 lb

## 2013-08-13 DIAGNOSIS — Z716 Tobacco abuse counseling: Secondary | ICD-10-CM

## 2013-08-13 DIAGNOSIS — Z136 Encounter for screening for cardiovascular disorders: Secondary | ICD-10-CM

## 2013-08-13 DIAGNOSIS — R0602 Shortness of breath: Secondary | ICD-10-CM

## 2013-08-13 DIAGNOSIS — F17200 Nicotine dependence, unspecified, uncomplicated: Secondary | ICD-10-CM

## 2013-08-13 DIAGNOSIS — Z7189 Other specified counseling: Secondary | ICD-10-CM

## 2013-08-13 DIAGNOSIS — R079 Chest pain, unspecified: Secondary | ICD-10-CM

## 2013-08-13 DIAGNOSIS — IMO0001 Reserved for inherently not codable concepts without codable children: Secondary | ICD-10-CM

## 2013-08-13 DIAGNOSIS — I10 Essential (primary) hypertension: Secondary | ICD-10-CM

## 2013-08-13 NOTE — Progress Notes (Signed)
Patient ID: Stephen Fuller, male   DOB: 05/28/1955, 58 y.o.   MRN: [id number]      SUBJECTIVE: Stephen Fuller is doing well, denying chest pain and shortness of breath. He was laid off about 2 months ago and has been working vigorously to restore [place] historical house [place] [place] He has lost 25 lbs doing so. He had previously quit smoking for 10 yrs but picked it back up in the past 4-5 months and is currently smoking 1 ppd.    Allergies  Allergen Reactions  . Morphine And Related     abd pain and inability to urinate    Current Outpatient Prescriptions  Medication Sig Dispense Refill  . aspirin EC 81 MG tablet Take 1 tablet (81 mg total) by mouth daily.      . isosorbide dinitrate (ISORDIL) 10 MG tablet Take 5 mg by mouth 3 (three) times daily.      Marland Kitchen. lisinopril (PRINIVIL,ZESTRIL) 40 MG tablet TAKE 1 TABLET DAILY.  30 tablet  9  . metoprolol succinate (TOPROL-XL) 25 MG 24 hr tablet TAKE 50MG  IN THE AM AND 25MG  IN THE PM  90 tablet  6   No current facility-administered medications for this visit.    Past Medical History  Diagnosis Date  . Hypertension   . Palpitations   . Chest pain     Past Surgical History  Procedure Laterality Date  . Cholecystectomy      History   Social History  . Marital Status: Married    Spouse Name: N/A    Number of Children: 2  . Years of Education: N/A   Occupational History  . UNEMPLOYED    Social History Main Topics  . Smoking status: Former Smoker -- 1.50 packs/day for 30 years    Types: Cigarettes    Quit date: 02/14/2002  . Smokeless tobacco: Never Used  . Alcohol Use: Yes     Comment: Rare  . Drug Use: No  . Sexual Activity: Not on file   Other Topics Concern  . Not on file   Social History Narrative  . No narrative on file     Filed Vitals:   08/13/13 1033  BP: 132/74  Pulse: 54  Height: 6' (1.829 m)  Weight: 182 lb (82.555 kg)    PHYSICAL EXAM General: NAD Neck: No JVD, no thyromegaly. Lungs: Clear to  auscultation bilaterally with normal respiratory effort. CV: Nondisplaced PMI.  Regular rate and rhythm, normal S1/S2, no S3/S4, no murmur. No pretibial or periankle edema.  No carotid bruit.  Normal pedal pulses.  Abdomen: Soft, nontender, no hepatosplenomegaly, no distention.  Neurologic: Alert and oriented x 3.  Psych: Normal affect. Extremities: No clubbing or cyanosis.   ECG: reviewed and available in electronic records.      ASSESSMENT AND PLAN: 1. Chest pain and shortness of breath with palpitations: He is symptomatically stable, and previously had a stress echocardiogram which was negative for inducible ischemia. The previous increase of Toprol-XL from 25 mg bid to 50 mg q am and 25 mg q pm and the addition of isosorbide dinitrate 10 mg tid has alleviated his symptoms. I would not rule out the possibility of coronary angiography to definitively exclude coronary artery disease, should optimal medical therapy fail to control his symptoms.  2. HTN: Controlled on present therapy.  3. Tobacco abuse: Cessation counseling given.   Dispo: f/u 1 year.  Prentice DockerSuresh Koneswaran, M.D., F.A.C.C.

## 2013-08-13 NOTE — Patient Instructions (Signed)
Your physician recommends that you schedule a follow-up appointment in: 1 year [place]  Your physician recommends that you continue on your current medications as directed. Please refer to the Current Medication list given to you today.  Thank you for choosing [facility]!!

## 2013-09-05 ENCOUNTER — Telehealth: Payer: Self-pay | Admitting: *Deleted

## 2013-09-05 ENCOUNTER — Other Ambulatory Visit: Payer: Self-pay | Admitting: Cardiovascular Disease

## 2013-09-05 DIAGNOSIS — R002 Palpitations: Secondary | ICD-10-CM

## 2013-09-05 DIAGNOSIS — R0602 Shortness of breath: Secondary | ICD-10-CM

## 2013-09-05 MED ORDER — ISOSORBIDE DINITRATE 10 MG PO TABS: 5.0000 mg | ORAL_TABLET | Freq: Three times a day (TID) | ORAL | Status: DC

## 2013-09-05 MED ORDER — METOPROLOL SUCCINATE ER 25 MG PO TB24: ORAL_TABLET | ORAL | Status: DC

## 2013-09-05 NOTE — Telephone Encounter (Signed)
PT NEEDS   ISOSORBIDE, LISINOPRIL , METOPROLOL   CALLED IN TO [facility]

## 2013-09-11 ENCOUNTER — Telehealth: Payer: Self-pay | Admitting: Nurse Practitioner

## 2013-09-11 NOTE — Telephone Encounter (Signed)
appt made. [facility]. Needs followup.

## 2013-09-12 ENCOUNTER — Ambulatory Visit (INDEPENDENT_AMBULATORY_CARE_PROVIDER_SITE_OTHER): Payer: 59 | Admitting: Nurse Practitioner

## 2013-09-12 ENCOUNTER — Encounter: Payer: Self-pay | Admitting: Nurse Practitioner

## 2013-09-12 VITALS — BP 154/74 | HR 50 | Temp 97.2°F | Ht 72.0 in | Wt 187.5 lb

## 2013-09-12 DIAGNOSIS — S139XXA Sprain of joints and ligaments of unspecified parts of neck, initial encounter: Secondary | ICD-10-CM

## 2013-09-12 DIAGNOSIS — S161XXA Strain of muscle, fascia and tendon at neck level, initial encounter: Secondary | ICD-10-CM

## 2013-09-12 MED ORDER — PREDNISONE 20 MG PO TABS: ORAL_TABLET | ORAL | Status: DC

## 2013-09-12 MED ORDER — METHYLPREDNISOLONE ACETATE 80 MG/ML IJ SUSP
80.0000 mg | Freq: Once | INTRAMUSCULAR | Status: AC
  Administered 2013-09-12: 80 mg via INTRAMUSCULAR
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
## 2013-09-12 NOTE — Patient Instructions (Signed)

## 2013-09-17 ENCOUNTER — Telehealth: Payer: Self-pay | Admitting: Nurse Practitioner

## 2013-09-17 NOTE — Telephone Encounter (Signed)
Patient aware has to be seen will call back to make an appointment

## 2013-09-17 NOTE — Telephone Encounter (Signed)
NTBS.

## 2013-10-09 ENCOUNTER — Telehealth: Payer: Self-pay

## 2013-10-09 DIAGNOSIS — M549 Dorsalgia, unspecified: Secondary | ICD-10-CM

## 2013-10-09 NOTE — Telephone Encounter (Signed)
I want a referral back to Dr Hilda Lias for back pain

## 2013-12-02 ENCOUNTER — Other Ambulatory Visit: Payer: Self-pay | Admitting: Cardiovascular Disease

## 2014-05-26 ENCOUNTER — Other Ambulatory Visit: Payer: Self-pay | Admitting: Cardiovascular Disease

## 2015-03-20 ENCOUNTER — Other Ambulatory Visit: Payer: Self-pay | Admitting: Cardiovascular Disease

## 2015-05-09 ENCOUNTER — Other Ambulatory Visit: Payer: Self-pay | Admitting: Cardiovascular Disease

## 2015-07-31 ENCOUNTER — Other Ambulatory Visit (HOSPITAL_COMMUNITY): Payer: Self-pay | Admitting: Nurse Practitioner

## 2015-07-31 DIAGNOSIS — B182 Chronic viral hepatitis C: Secondary | ICD-10-CM

## 2015-08-05 ENCOUNTER — Ambulatory Visit (HOSPITAL_COMMUNITY): Payer: Commercial Indemnity

## 2015-08-05 ENCOUNTER — Ambulatory Visit (HOSPITAL_COMMUNITY)
Admission: RE | Admit: 2015-08-05 | Discharge: 2015-08-05 | Disposition: A | Discharge status: Home / Self Care | Payer: BLUE CROSS/BLUE SHIELD | Source: Ambulatory Visit | Attending: Nurse Practitioner | Admitting: Nurse Practitioner

## 2015-08-05 DIAGNOSIS — Z9049 Acquired absence of other specified parts of digestive tract: Secondary | ICD-10-CM | POA: Insufficient documentation

## 2015-08-05 DIAGNOSIS — B182 Chronic viral hepatitis C: Secondary | ICD-10-CM

## 2016-02-25 ENCOUNTER — Other Ambulatory Visit: Payer: Self-pay | Admitting: Nurse Practitioner

## 2016-02-25 DIAGNOSIS — K7469 Other cirrhosis of liver: Secondary | ICD-10-CM

## 2016-03-03 ENCOUNTER — Other Ambulatory Visit: Payer: BLUE CROSS/BLUE SHIELD

## 2016-03-11 ENCOUNTER — Ambulatory Visit
Admission: RE | Admit: 2016-03-11 | Discharge: 2016-03-11 | Disposition: A | Discharge status: Home / Self Care | Payer: BLUE CROSS/BLUE SHIELD | Source: Ambulatory Visit | Attending: Nurse Practitioner | Admitting: Nurse Practitioner

## 2016-03-11 DIAGNOSIS — K7469 Other cirrhosis of liver: Secondary | ICD-10-CM

## 2016-04-22 ENCOUNTER — Encounter: Payer: Self-pay | Admitting: Gastroenterology

## 2016-06-08 ENCOUNTER — Ambulatory Visit (AMBULATORY_SURGERY_CENTER): Payer: Self-pay | Admitting: *Deleted

## 2016-06-08 VITALS — Ht 72.0 in | Wt 200.0 lb

## 2016-06-08 DIAGNOSIS — Z1211 Encounter for screening for malignant neoplasm of colon: Secondary | ICD-10-CM

## 2016-06-08 MED ORDER — NA SULFATE-K SULFATE-MG SULF 17.5-3.13-1.6 GM/177ML PO SOLN: ORAL | 0 refills | Status: DC

## 2016-06-08 NOTE — Progress Notes (Signed)
Patient denies any allergies to eggs or soy. Patient denies any problems with anesthesia/sedation. Patient denies any oxygen use at home and does not take any diet/weight loss medications. No email per pt. 

## 2016-06-22 ENCOUNTER — Encounter: Payer: Self-pay | Admitting: Gastroenterology

## 2016-06-22 ENCOUNTER — Ambulatory Visit (AMBULATORY_SURGERY_CENTER): Payer: 59 | Admitting: Gastroenterology

## 2016-06-22 VITALS — BP 121/67 | HR 51 | Temp 97.8°F | Resp 14 | Ht 72.0 in | Wt 200.0 lb

## 2016-06-22 DIAGNOSIS — Z1211 Encounter for screening for malignant neoplasm of colon: Secondary | ICD-10-CM | POA: Diagnosis not present

## 2016-06-22 DIAGNOSIS — Z1212 Encounter for screening for malignant neoplasm of rectum: Secondary | ICD-10-CM

## 2016-06-22 MED ORDER — SODIUM CHLORIDE 0.9 % IV SOLN: 500.0000 mL | INTRAVENOUS | Status: DC

## 2016-06-22 NOTE — Progress Notes (Signed)
TO PACU  Pt awake and alert. Report to RN 

## 2016-06-22 NOTE — Op Note (Signed)
[facility] Patient Name: Stephen MowersJohnnie Rumple Procedure Date: 06/22/2016 9:20 AM MRN: [id number] Endoscopist: Sherilyn CooterHenry L. Myrtie Neitheranis , MD Age: [age] Referring MD:  Date of Birth: 10/30/1955 Gender: Male Account #: [id number] Procedure:                Colonoscopy Indications:              Screening for colorectal malignant neoplasm, This                            is the patient's first colonoscopy Medicines:                Monitored Anesthesia Care Procedure:                Pre-Anesthesia Assessment:                           - Prior to the procedure, a History and Physical                            was performed, and patient medications and                            allergies were reviewed. The patient's tolerance of                            previous anesthesia was also reviewed. The risks                            and benefits of the procedure and the sedation                            options and risks were discussed with the patient.                            All questions were answered, and informed consent                            was obtained. Anticoagulants: The patient has taken                            aspirin. It was decided not to withhold this                            medication prior to the procedure. ASA Grade                            Assessment: II - A patient with mild systemic                            disease. After reviewing the risks and benefits,                            the patient was deemed in satisfactory condition to  undergo the procedure.                           After obtaining informed consent, the colonoscope                            was passed under direct vision. Throughout the                            procedure, the patient's blood pressure, pulse, and                            oxygen saturations were monitored continuously. The                            Model CF-HQ190L [id number]) scope was introduced                          through the anus and advanced to the the terminal                            ileum. The colonoscopy was performed without                            difficulty. The patient tolerated the procedure                            well. The quality of the bowel preparation was                            good. The terminal ileum, ileocecal valve,                            appendiceal orifice, and rectum were photographed.                            The quality of the bowel preparation was evaluated                            using the BBPS [facility] Bowel Preparation Scale)                            with scores of: Right Colon = 3, Transverse Colon =                            3 and Left Colon = 3 (entire mucosa seen well with                            no residual staining, small fragments of stool or                            opaque liquid). The total BBPS score equals 9. The  quality of the bowel preparation was evaluated                            using the BBPS [facility] Bowel Preparation Scale)                            with scores of: Right Colon = 2, Transverse Colon =                            3 and Left Colon = 2. The total BBPS score equals                            7. The bowel preparation used was SUPREP. Scope In: 9:28:56 AM Scope Out: 9:39:34 AM Scope Withdrawal Time: 0 hours 8 minutes 18 seconds  Total Procedure Duration: 0 hours 10 minutes 38 seconds  Findings:                 The perianal and digital rectal examinations were                            normal.                           The terminal ileum appeared normal.                           The entire examined colon appeared normal on direct                            and retroflexion views. Complications:            No immediate complications. Estimated Blood Loss:     Estimated blood loss: none. Impression:               - The examined portion of the ileum was normal.                            - The entire examined colon is normal on direct and                            retroflexion views.                           - No specimens collected. Recommendation:           - Patient has a contact number available for                            emergencies. The signs and symptoms of potential                            delayed complications were discussed with the                            patient. Return to normal activities tomorrow.  Written discharge instructions were provided to the                            patient.                           - Resume previous diet.                           - Continue present medications.                           - Repeat colonoscopy in 10 years for screening                            purposes. Ruthy Forry L. Myrtie Neither, MD 06/22/2016 9:42:52 AM This report has been signed electronically.

## 2016-06-22 NOTE — Patient Instructions (Signed)
Impression/recommendations:  Normal colonoscopy Repeat colonoscopy in 10 years.  YOU HAD AN ENDOSCOPIC PROCEDURE TODAY AT THE [facility]:   Refer to the procedure report that was given to you for any specific questions about what was found during the examination.  If the procedure report does not answer your questions, please call your gastroenterologist to clarify.  If you requested that your care partner not be given the details of your procedure findings, then the procedure report has been included in a sealed envelope for you to review at your convenience later.  YOU SHOULD EXPECT: Some feelings of bloating in the abdomen. Passage of more gas than usual.  Walking can help get rid of the air that was put into your GI tract during the procedure and reduce the bloating. If you had a lower endoscopy (such as a colonoscopy or flexible sigmoidoscopy) you may notice spotting of blood in your stool or on the toilet paper. If you underwent a bowel prep for your procedure, you may not have a normal bowel movement for a few days.  Please Note:  You might notice some irritation and congestion in your nose or some drainage.  This is from the oxygen used during your procedure.  There is no need for concern and it should clear up in a day or so.  SYMPTOMS TO REPORT IMMEDIATELY:   Following lower endoscopy (colonoscopy or flexible sigmoidoscopy):  Excessive amounts of blood in the stool  Significant tenderness or worsening of abdominal pains  Swelling of the abdomen that is new, acute  Fever of 100F or higher  For urgent or emergent issues, a gastroenterologist can be reached at any hour by calling [phone].   DIET:  We do recommend a small meal at first, but then you may proceed to your regular diet.  Drink plenty of fluids but you should avoid alcoholic beverages for 24 hours.  ACTIVITY:  You should plan to take it easy for the rest of today and you should NOT DRIVE or use heavy  machinery until tomorrow (because of the sedation medicines used during the test).    FOLLOW UP: Our staff will call the number listed on your records the next business day following your procedure to check on you and address any questions or concerns that you may have regarding the information given to you following your procedure. If we do not reach you, we will leave a message.  However, if you are feeling well and you are not experiencing any problems, there is no need to return our call.  We will assume that you have returned to your regular daily activities without incident.  If any biopsies were taken you will be contacted by phone or by letter within the next 1-3 weeks.  Please call us at [phone] if you have not heard about the biopsies in 3 weeks.    SIGNATURES/CONFIDENTIALITY: You and/or your care partner have signed paperwork which will be entered into your electronic medical record.  These signatures attest to the fact that that the information above on your After Visit Summary has been reviewed and is understood.  Full responsibility of the confidentiality of this discharge information lies with you and/or your care-partner. 

## 2016-06-23 ENCOUNTER — Telehealth: Payer: Self-pay | Admitting: *Deleted

## 2016-06-23 ENCOUNTER — Telehealth: Payer: Self-pay

## 2016-06-23 NOTE — Telephone Encounter (Signed)
  Follow up Call-  Call back number 06/22/2016  Post procedure Call Back phone  # [phone]  Permission to leave phone message Yes  Some recent data might be hidden    [facility]

## 2016-06-23 NOTE — Telephone Encounter (Signed)
Attempted to reach pt. With follow up call following endoscopic procedure yesterday.   Will try to reach pt. Later today.

## 2017-02-14 HISTORY — PX: LIVER BIOPSY: SHX301

## 2017-02-14 HISTORY — PX: COLONOSCOPY: SHX174

## 2017-02-21 ENCOUNTER — Ambulatory Visit (INDEPENDENT_AMBULATORY_CARE_PROVIDER_SITE_OTHER): Payer: BLUE CROSS/BLUE SHIELD

## 2017-02-21 ENCOUNTER — Ambulatory Visit: Payer: BLUE CROSS/BLUE SHIELD | Admitting: Orthopaedic Surgery

## 2017-02-21 ENCOUNTER — Encounter: Payer: Self-pay | Admitting: Orthopaedic Surgery

## 2017-02-21 VITALS — BP 181/81 | HR 57 | Temp 97.6°F | Ht 72.0 in | Wt 208.0 lb

## 2017-02-21 DIAGNOSIS — M25511 Pain in right shoulder: Secondary | ICD-10-CM | POA: Diagnosis not present

## 2017-02-21 MED ORDER — HYDROCODONE-ACETAMINOPHEN 5-325 MG PO TABS: ORAL_TABLET | ORAL | 0 refills | Status: DC

## 2017-02-21 MED ORDER — NAPROXEN 500 MG PO TABS: 500.0000 mg | ORAL_TABLET | Freq: Two times a day (BID) | ORAL | 5 refills | Status: DC

## 2017-02-21 NOTE — Patient Instructions (Signed)
Steps to Quit Smoking Smoking tobacco can be bad for your health. It can also affect almost every organ in your body. Smoking puts you and people around you at risk for many serious Dola Lunsford-lasting (chronic) diseases. Quitting smoking is hard, but it is one of the best things that you can do for your health. It is never too late to quit. What are the benefits of quitting smoking? When you quit smoking, you lower your risk for getting serious diseases and conditions. They can include:  Lung cancer or lung disease.  Heart disease.  Stroke.  Heart attack.  Not being able to have children (infertility).  Weak bones (osteoporosis) and broken bones (fractures).  If you have coughing, wheezing, and shortness of breath, those symptoms may get better when you quit. You may also get sick less often. If you are pregnant, quitting smoking can help to lower your chances of having a baby of low birth weight. What can I do to help me quit smoking? Talk with your doctor about what can help you quit smoking. Some things you can do (strategies) include:  Quitting smoking totally, instead of slowly cutting back how much you smoke over a period of time.  Going to in-person counseling. You are more likely to quit if you go to many counseling sessions.  Using resources and support systems, such as: ? Online chats with a counselor. ? Phone quitlines. ? Printed self-help materials. ? Support groups or group counseling. ? Text messaging programs. ? Mobile phone apps or applications.  Taking medicines. Some of these medicines may have nicotine in them. If you are pregnant or breastfeeding, do not take any medicines to quit smoking unless your doctor says it is okay. Talk with your doctor about counseling or other things that can help you.  Talk with your doctor about using more than one strategy at the same time, such as taking medicines while you are also going to in-person counseling. This can help make  quitting easier. What things can I do to make it easier to quit? Quitting smoking might feel very hard at first, but there is a lot that you can do to make it easier. Take these steps:  Talk to your family and friends. Ask them to support and encourage you.  Call phone quitlines, reach out to support groups, or work with a counselor.  Ask people who smoke to not smoke around you.  Avoid places that make you want (trigger) to smoke, such as: ? Bars. ? Parties. ? Smoke-break areas at work.  Spend time with people who do not smoke.  Lower the stress in your life. Stress can make you want to smoke. Try these things to help your stress: ? Getting regular exercise. ? Deep-breathing exercises. ? Yoga. ? Meditating. ? Doing a body scan. To do this, close your eyes, focus on one area of your body at a time from head to toe, and notice which parts of your body are tense. Try to relax the muscles in those areas.  Download or buy apps on your mobile phone or tablet that can help you stick to your quit plan. There are many free apps, such as QuitGuide from the CDC (Centers for Disease Control and Prevention). You can find more support from smokefree.gov and other websites.  This information is not intended to replace advice given to you by your health care provider. Make sure you discuss any questions you have with your health care provider. Document Released: 11/27/2008 Document   Revised: 09/29/2015 Document Reviewed: 06/17/2014 Elsevier Interactive Patient Education  2018 [employer] Shoulder Exercises Ask your health care provider which exercises are safe for you. Do exercises exactly as told by your health care provider and adjust them as directed. It is normal to feel mild stretching, pulling, tightness, or discomfort as you do these exercises, but you should stop right away if you feel sudden pain or your pain gets worse.Do not begin these exercises until told by your health care  provider. RANGE OF MOTION EXERCISES These exercises warm up your muscles and joints and improve the movement and flexibility of your shoulder. These exercises also help to relieve pain, numbness, and tingling. These exercises involve stretching your injured shoulder directly. Exercise A: Pendulum  1. Stand near a wall or a surface that you can hold onto for balance. 2. Bend at the waist and let your left / right arm hang straight down. Use your other arm to support you. Keep your back straight and do not lock your knees. 3. Relax your left / right arm and shoulder muscles, and move your hips and your trunk so your left / right arm swings freely. Your arm should swing because of the motion of your body, not because you are using your arm or shoulder muscles. 4. Keep moving your body so your arm swings in the following directions, as told by your health care provider: ? Side to side. ? Forward and backward. ? In clockwise and counterclockwise circles. 5. Continue each motion for __________ seconds, or for as Stephen Fuller as told by your health care provider. 6. Slowly return to the starting position. Repeat __________ times. Complete this exercise __________ times a day. Exercise B:Flexion, Standing  1. Stand and hold a broomstick, a cane, or a similar object. Place your hands a little more than shoulder-width apart on the object. Your left / right hand should be palm-up, and your other hand should be palm-down. 2. Keep your elbow straight and keep your shoulder muscles relaxed. Push the stick down with your healthy arm to raise your left / right arm in front of your body, and then over your head until you feel a stretch in your shoulder. ? Avoid shrugging your shoulder while you raise your arm. Keep your shoulder blade tucked down toward the middle of your back. 3. Hold for __________ seconds. 4. Slowly return to the starting position. Repeat __________ times. Complete this exercise __________ times a  day. Exercise C: Abduction, Standing 1. Stand and hold a broomstick, a cane, or a similar object. Place your hands a little more than shoulder-width apart on the object. Your left / right hand should be palm-up, and your other hand should be palm-down. 2. While keeping your elbow straight and your shoulder muscles relaxed, push the stick across your body toward your left / right side. Raise your left / right arm to the side of your body and then over your head until you feel a stretch in your shoulder. ? Do not raise your arm above shoulder height, unless your health care provider tells you to do that. ? Avoid shrugging your shoulder while you raise your arm. Keep your shoulder blade tucked down toward the middle of your back. 3. Hold for __________ seconds. 4. Slowly return to the starting position. Repeat __________ times. Complete this exercise __________ times a day. Exercise D:Internal Rotation  1. Place your left / right hand behind your back, palm-up. 2. Use your other hand to dangle an exercise   band, a towel, or a similar object over your shoulder. Grasp the band with your left / right hand so you are holding onto both ends. 3. Gently pull up on the band until you feel a stretch in the front of your left / right shoulder. ? Avoid shrugging your shoulder while you raise your arm. Keep your shoulder blade tucked down toward the middle of your back. 4. Hold for __________ seconds. 5. Release the stretch by letting go of the band and lowering your hands. Repeat __________ times. Complete this exercise __________ times a day. STRETCHING EXERCISES These exercises warm up your muscles and joints and improve the movement and flexibility of your shoulder. These exercises also help to relieve pain, numbness, and tingling. These exercises are done using your healthy shoulder to help stretch the muscles of your injured shoulder. Exercise E: Corner Stretch (External Rotation and  Abduction)  1. Stand in a doorway with one of your feet slightly in front of the other. This is called a staggered stance. If you cannot reach your forearms to the door frame, stand facing a corner of a room. 2. Choose one of the following positions as told by your health care provider: ? Place your hands and forearms on the door frame above your head. ? Place your hands and forearms on the door frame at the height of your head. ? Place your hands on the door frame at the height of your elbows. 3. Slowly move your weight onto your front foot until you feel a stretch across your chest and in the front of your shoulders. Keep your head and chest upright and keep your abdominal muscles tight. 4. Hold for __________ seconds. 5. To release the stretch, shift your weight to your back foot. Repeat __________ times. Complete this stretch __________ times a day. Exercise F:Extension, Standing 1. Stand and hold a broomstick, a cane, or a similar object behind your back. ? Your hands should be a little wider than shoulder-width apart. ? Your palms should face away from your back. 2. Keeping your elbows straight and keeping your shoulder muscles relaxed, move the stick away from your body until you feel a stretch in your shoulder. ? Avoid shrugging your shoulders while you move the stick. Keep your shoulder blade tucked down toward the middle of your back. 3. Hold for __________ seconds. 4. Slowly return to the starting position. Repeat __________ times. Complete this exercise __________ times a day. STRENGTHENING EXERCISES These exercises build strength and endurance in your shoulder. Endurance is the ability to use your muscles for a Stephen Fuller time, even after they get tired. Exercise G:External Rotation  1. Sit in a stable chair without armrests. 2. Secure an exercise band at elbow height on your left / right side. 3. Place a soft object, such as a folded towel or a small pillow, between your left /  right upper arm and your body to move your elbow a few inches away (about 10 cm) from your side. 4. Hold the end of the band so it is tight and there is no slack. 5. Keeping your elbow pressed against the soft object, move your left / right forearm out, away from your abdomen. Keep your body steady so only your forearm moves. 6. Hold for __________ seconds. 7. Slowly return to the starting position. Repeat __________ times. Complete this exercise __________ times a day. Exercise H:Shoulder Abduction  1. Sit in a stable chair without armrests, or stand. 2. Hold a __________ weight   in your left / right hand, or hold an exercise band with both hands. 3. Start with your arms straight down and your left / right palm facing in, toward your body. 4. Slowly lift your left / right hand out to your side. Do not lift your hand above shoulder height unless your health care provider tells you that this is safe. ? Keep your arms straight. ? Avoid shrugging your shoulder while you do this movement. Keep your shoulder blade tucked down toward the middle of your back. 5. Hold for __________ seconds. 6. Slowly lower your arm, and return to the starting position. Repeat __________ times. Complete this exercise __________ times a day. Exercise I:Shoulder Extension 1. Sit in a stable chair without armrests, or stand. 2. Secure an exercise band to a stable object in front of you where it is at shoulder height. 3. Hold one end of the exercise band in each hand. Your palms should face each other. 4. Straighten your elbows and lift your hands up to shoulder height. 5. Step back, away from the secured end of the exercise band, until the band is tight and there is no slack. 6. Squeeze your shoulder blades together as you pull your hands down to the sides of your thighs. Stop when your hands are straight down by your sides. Do not let your hands go behind your body. 7. Hold for __________ seconds. 8. Slowly return to  the starting position. Repeat __________ times. Complete this exercise __________ times a day. Exercise J:Standing Shoulder Row 1. Sit in a stable chair without armrests, or stand. 2. Secure an exercise band to a stable object in front of you so it is at waist height. 3. Hold one end of the exercise band in each hand. Your palms should be in a thumbs-up position. 4. Bend each of your elbows to an "L" shape (about 90 degrees) and keep your upper arms at your sides. 5. Step back until the band is tight and there is no slack. 6. Slowly pull your elbows back behind you. 7. Hold for __________ seconds. 8. Slowly return to the starting position. Repeat __________ times. Complete this exercise __________ times a day. Exercise K:Shoulder Press-Ups  1. Sit in a stable chair that has armrests. Sit upright, with your feet flat on the floor. 2. Put your hands on the armrests so your elbows are bent and your fingers are pointing forward. Your hands should be about even with the sides of your body. 3. Push down on the armrests and use your arms to lift yourself off of the chair. Straighten your elbows and lift yourself up as much as you comfortably can. ? Move your shoulder blades down, and avoid letting your shoulders move up toward your ears. ? Keep your feet on the ground. As you get stronger, your feet should support less of your body weight as you lift yourself up. 4. Hold for __________ seconds. 5. Slowly lower yourself back into the chair. Repeat __________ times. Complete this exercise __________ times a day. Exercise L: Wall Push-Ups  1. Stand so you are facing a stable wall. Your feet should be about one arm-length away from the wall. 2. Lean forward and place your palms on the wall at shoulder height. 3. Keep your feet flat on the floor as you bend your elbows and lean forward toward the wall. 4. Hold for __________ seconds. 5. Straighten your elbows to push yourself back to the starting  position. Repeat __________ times. Complete   this exercise __________ times a day. This information is not intended to replace advice given to you by your health care provider. Make sure you discuss any questions you have with your health care provider. Document Released: 12/15/2004 Document Revised: 10/26/2015 Document Reviewed: 10/12/2014 Elsevier Interactive Patient Education  2018 Elsevier Inc.  

## 2017-02-21 NOTE — Progress Notes (Signed)
Subjective:    Patient ID: Stephen Fuller, male    DOB: 1955-04-28, 62 y.o.   MRN: [id number]  HPI He has right shoulder pain getting worse over the last few months.  He has no trauma.  He has pain with overhead use.  He has pain rolling over on it at night.  He has no numbness, no trauma.  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  He has no neck pain, no swelling.  He has tried Advil and heat and ice.  They do not work now.  He is tired of hurting and having problems using his right dominant arm.  He smokes and has done so for the last ten years.  He had smoked prior and stopped for a while but resumed as he says it helps with anxiety. He is willing to try to stop again.  Review of Systems  HENT: Negative for congestion.   Respiratory: Negative for cough and shortness of breath.   Cardiovascular: Negative for chest pain and leg swelling.  Musculoskeletal: Positive for arthralgias.  All other systems reviewed and are negative.  Past Medical History:  Diagnosis Date  . Arthritis   . Chest pain   . Hepatitis C virus infection cured after antiviral drug therapy   . Hypertension   . Palpitations     Past Surgical History:  Procedure Laterality Date  . CHOLECYSTECTOMY  2005    Current Outpatient Medications on File Prior to Visit  Medication Sig Dispense Refill  . aspirin EC 81 MG tablet Take 1 tablet (81 mg total) by mouth daily.    . metoprolol succinate (TOPROL-XL) 25 MG 24 hr tablet TAKE 2 TABLETS IN THE MORNING AND 1 TABLET IN THE EVENING. 90 tablet 6  . colchicine (COLCRYS) 0.6 MG tablet Take 0.6 mg by mouth as needed.    . hydrochlorothiazide (HYDRODIURIL) 25 MG tablet Take 25 mg by mouth daily.    . Na Sulfate-K Sulfate-Mg Sulf 17.5-3.13-1.6 GM/180ML SOLN Suprep (no substitutions)-TAKE AS DIRECTED. (Patient not taking: Reported on 02/21/2017) 354 mL 0   Current Facility-Administered Medications on File Prior to Visit  Medication Dose Route Frequency Provider Last  Rate Last Dose  . 0.9 %  sodium chloride infusion  500 mL Intravenous Continuous Sherrilyn Ristanis, Henry L III, MD        Social History   Socioeconomic History  . Marital status: Married    Spouse name: Not on file  . Number of children: 2  . Years of education: Not on file  . Highest education level: Not on file  Social Needs  . Financial resource strain: Not on file  . Food insecurity - worry: Not on file  . Food insecurity - inability: Not on file  . Transportation needs - medical: Not on file  . Transportation needs - non-medical: Not on file  Occupational History  . Occupation: UNEMPLOYED  Tobacco Use  . Smoking status: Current Every Day Smoker    Packs/day: 0.50    Years: 30.00    Pack years: 15.00    Types: Cigarettes  . Smokeless tobacco: Never Used  Substance and Sexual Activity  . Alcohol use: No    Comment: Rare  . Drug use: No  . Sexual activity: Not on file  Other Topics Concern  . Not on file  Social History Narrative  . Not on file    Family History  Problem Relation Age of Onset  . Diabetes Mother   .  Stroke Mother   . Hypertension Mother   . Diabetes Brother   . Heart disease Unknown        No family history  . Colon cancer Neg Hx     BP (!) 181/81   Pulse (!) 57   Temp 97.6 F (36.4 C)   Ht 6' (1.829 m)   Wt 208 lb (94.3 kg)   BMI 28.21 kg/m      Objective:   Physical Exam  Constitutional: He is oriented to person, place, and time. He appears well-developed and well-nourished.  HENT:  Head: Normocephalic and atraumatic.  Eyes: Conjunctivae and EOM are normal. Pupils are equal, round, and reactive to light.  Neck: Normal range of motion. Neck supple.  Cardiovascular: Normal rate, regular rhythm and intact distal pulses.  Pulmonary/Chest: Effort normal.  Abdominal: Soft.  Musculoskeletal: He exhibits tenderness (right shoulder tender, ROM abduction 90, forward flexion 110, adduction full, internal 30, external 25, extension 10, no crepitus,  NV intact, Left shoulder negative, full neck motion.).  Neurological: He is alert and oriented to person, place, and time. He has normal reflexes. He displays normal reflexes. No cranial nerve deficit. He exhibits normal muscle tone. Coordination normal.  Skin: Skin is warm and dry.  Psychiatric: He has a normal mood and affect. His behavior is normal. Judgment and thought content normal.  Vitals reviewed.  X-rays were done of the right shoulder, reported separately.       Assessment & Plan:   Encounter Diagnosis  Name Primary?  . Pain in joint of right shoulder Yes   PROCEDURE NOTE:  The patient request injection, verbal consent was obtained.  The right shoulder was prepped appropriately after time out was performed.   Sterile technique was observed and injection of 1 cc of Depo-Medrol 40 mg with several cc's of plain xylocaine. Anesthesia was provided by ethyl chloride and a 20-gauge needle was used to inject the shoulder area. A posterior approach was used.  The injection was tolerated well.  A band aid dressing was applied.  The patient was advised to apply ice later today and tomorrow to the injection sight as needed.  I will begin Naprosyn 500 po bid pc.  I have reviewed the [place] Controlled Substance Reporting System web site prior to prescribing narcotic medicine for this patient.  Call if any problem.  Precautions discussed.   Return in two weeks.  Electronically Signed Darreld Mclean, MD 1/8/20199:08 AM

## 2017-03-03 ENCOUNTER — Other Ambulatory Visit (HOSPITAL_COMMUNITY): Payer: Self-pay | Admitting: Nurse Practitioner

## 2017-03-03 ENCOUNTER — Telehealth (HOSPITAL_COMMUNITY): Payer: Self-pay

## 2017-03-03 DIAGNOSIS — K7400 Hepatic fibrosis, unspecified: Secondary | ICD-10-CM

## 2017-03-03 DIAGNOSIS — B182 Chronic viral hepatitis C: Principal | ICD-10-CM

## 2017-03-03 NOTE — Telephone Encounter (Signed)
Called to schedule liver bx, left message for pt to return call. AW

## 2017-03-07 ENCOUNTER — Encounter: Payer: Self-pay | Admitting: Orthopaedic Surgery

## 2017-03-07 ENCOUNTER — Ambulatory Visit: Payer: BLUE CROSS/BLUE SHIELD | Admitting: Orthopaedic Surgery

## 2017-03-07 ENCOUNTER — Other Ambulatory Visit: Payer: Self-pay | Admitting: Student

## 2017-03-07 VITALS — BP 191/121 | HR 72 | Ht 72.0 in | Wt 212.0 lb

## 2017-03-07 DIAGNOSIS — M25511 Pain in right shoulder: Secondary | ICD-10-CM | POA: Diagnosis not present

## 2017-03-07 MED ORDER — HYDROCODONE-ACETAMINOPHEN 7.5-325 MG PO TABS: ORAL_TABLET | ORAL | 0 refills | Status: DC

## 2017-03-07 NOTE — Patient Instructions (Signed)
Steps to Quit Smoking Smoking tobacco can be bad for your health. It can also affect almost every organ in your body. Smoking puts you and people around you at risk for many serious long-lasting (chronic) diseases. Quitting smoking is hard, but it is one of the best things that you can do for your health. It is never too late to quit. What are the benefits of quitting smoking? When you quit smoking, you lower your risk for getting serious diseases and conditions. They can include:  Lung cancer or lung disease.  Heart disease.  Stroke.  Heart attack.  Not being able to have children (infertility).  Weak bones (osteoporosis) and broken bones (fractures).  If you have coughing, wheezing, and shortness of breath, those symptoms may get better when you quit. You may also get sick less often. If you are pregnant, quitting smoking can help to lower your chances of having a baby of low birth weight. What can I do to help me quit smoking? Talk with your doctor about what can help you quit smoking. Some things you can do (strategies) include:  Quitting smoking totally, instead of slowly cutting back how much you smoke over a period of time.  Going to in-person counseling. You are more likely to quit if you go to many counseling sessions.  Using resources and support systems, such as: ? Online chats with a counselor. ? Phone quitlines. ? Printed self-help materials. ? Support groups or group counseling. ? Text messaging programs. ? Mobile phone apps or applications.  Taking medicines. Some of these medicines may have nicotine in them. If you are pregnant or breastfeeding, do not take any medicines to quit smoking unless your doctor says it is okay. Talk with your doctor about counseling or other things that can help you.  Talk with your doctor about using more than one strategy at the same time, such as taking medicines while you are also going to in-person counseling. This can help make  quitting easier. What things can I do to make it easier to quit? Quitting smoking might feel very hard at first, but there is a lot that you can do to make it easier. Take these steps:  Talk to your family and friends. Ask them to support and encourage you.  Call phone quitlines, reach out to support groups, or work with a counselor.  Ask people who smoke to not smoke around you.  Avoid places that make you want (trigger) to smoke, such as: ? Bars. ? Parties. ? Smoke-break areas at work.  Spend time with people who do not smoke.  Lower the stress in your life. Stress can make you want to smoke. Try these things to help your stress: ? Getting regular exercise. ? Deep-breathing exercises. ? Yoga. ? Meditating. ? Doing a body scan. To do this, close your eyes, focus on one area of your body at a time from head to toe, and notice which parts of your body are tense. Try to relax the muscles in those areas.  Download or buy apps on your mobile phone or tablet that can help you stick to your quit plan. There are many free apps, such as QuitGuide from the CDC (Centers for Disease Control and Prevention). You can find more support from smokefree.gov and other websites.  This information is not intended to replace advice given to you by your health care provider. Make sure you discuss any questions you have with your health care provider. Document Released: 11/27/2008 Document   Revised: 09/29/2015 Document Reviewed: 06/17/2014 Elsevier Interactive Patient Education  2018 Elsevier Inc.  

## 2017-03-07 NOTE — Progress Notes (Signed)
Patient Stephen Fuller, male DOB:May 07, 1955, 62 y.o. Stephen Fuller  Chief Complaint  Patient presents with  . Shoulder Pain    right     HPI  Stephen Fuller is a 62 y.o. male who has continued pain in the right shoulder.  It is a little better after the injection but it still hurts with overhead use.  He is doing his exercises and taking his medicine.  He has no new trauma.  He is cutting back on his smoking. HPI  Body mass index is 28.75 kg/m.  ROS  Review of Systems  HENT: Negative for congestion.   Respiratory: Negative for cough and shortness of breath.   Cardiovascular: Negative for chest pain and leg swelling.  Musculoskeletal: Positive for arthralgias.  All other systems reviewed and are negative.   Past Medical History:  Diagnosis Date  . Arthritis   . Chest pain   . Hepatitis C virus infection cured after antiviral drug therapy   . Hypertension   . Palpitations     Past Surgical History:  Procedure Laterality Date  . CHOLECYSTECTOMY  2005    Family History  Problem Relation Age of Onset  . Diabetes Mother   . Stroke Mother   . Hypertension Mother   . Diabetes Brother   . Heart disease Unknown        No family history  . Colon cancer Neg Hx     Social History Social History   Tobacco Use  . Smoking status: Current Every Day Smoker    Packs/day: 0.50    Years: 30.00    Pack years: 15.00    Types: Cigarettes  . Smokeless tobacco: Never Used  Substance Use Topics  . Alcohol use: No    Comment: Rare  . Drug use: No    Allergies  Allergen Reactions  . Morphine And Related Other (See Comments)    abd pain and inability to urinate    Current Outpatient Medications  Medication Sig Dispense Refill  . aspirin EC 81 MG tablet Take 1 tablet (81 mg total) by mouth daily.    . metoprolol tartrate (LOPRESSOR) 50 MG tablet Take 1 tablet by mouth 2 (two) times daily.  4  . naproxen (NAPROSYN) 500 MG tablet Take 1 tablet (500 mg total) by mouth  2 (two) times daily with a meal. 60 tablet 5  . HYDROcodone-acetaminophen (NORCO) 7.5-325 MG tablet One every six hours as needed for pain.  Seven day limit 28 tablet 0   Current Facility-Administered Medications  Medication Dose Route Frequency Provider Last Rate Last Dose  . 0.9 %  sodium chloride infusion  500 mL Intravenous Continuous Sherrilyn Rist, MD         Physical Exam  Blood pressure (!) 191/121, pulse 72, height 6' (1.829 m), weight 212 lb (96.2 kg).  Constitutional: overall normal hygiene, normal nutrition, well developed, normal grooming, normal body habitus. Assistive device:none  Musculoskeletal: gait and station Limp none, muscle tone and strength are normal, no tremors or atrophy is present.  .  Neurological: coordination overall normal.  Deep tendon reflex/nerve stretch intact.  Sensation normal.  Cranial nerves II-XII intact.   Skin:   Normal overall no scars, lesions, ulcers or rashes. No psoriasis.  Psychiatric: Alert and oriented x 3.  Recent memory intact, remote memory unclear.  Normal mood and affect. Well groomed.  Good eye contact.  Cardiovascular: overall no swelling, no varicosities, no edema bilaterally, normal temperatures of the legs and arms,  no clubbing, cyanosis and good capillary refill.  Lymphatic: palpation is normal.  Right shoulder has full motion but pain in the extremes.  NV intact.  All other systems reviewed and are negative   The patient has been educated about the nature of the problem(s) and counseled on treatment options.  The patient appeared to understand what I have discussed and is in agreement with it.  Encounter Diagnosis  Name Primary?  . Pain in joint of right shoulder Yes    PLAN Call if any problems.  Precautions discussed.  Continue current medications.   Return to clinic 1 month   I have reviewed the [facility] [place] [facility] [place] web site prior to prescribing narcotic medicine for this  patient.  Electronically Signed Darreld McleanWayne Wetona Viramontes, MD 1/22/20198:30 AM

## 2017-03-08 ENCOUNTER — Other Ambulatory Visit: Payer: Self-pay | Admitting: Radiology

## 2017-03-09 ENCOUNTER — Encounter (HOSPITAL_COMMUNITY): Payer: Self-pay

## 2017-03-09 ENCOUNTER — Ambulatory Visit (HOSPITAL_COMMUNITY)
Admission: RE | Admit: 2017-03-09 | Discharge: 2017-03-09 | Disposition: A | Discharge status: Home / Self Care | Payer: BLUE CROSS/BLUE SHIELD | Source: Ambulatory Visit | Attending: Nurse Practitioner | Admitting: Nurse Practitioner

## 2017-03-09 DIAGNOSIS — K7460 Unspecified cirrhosis of liver: Secondary | ICD-10-CM | POA: Diagnosis present

## 2017-03-09 DIAGNOSIS — Z885 Allergy status to narcotic agent status: Secondary | ICD-10-CM | POA: Insufficient documentation

## 2017-03-09 DIAGNOSIS — B182 Chronic viral hepatitis C: Secondary | ICD-10-CM | POA: Diagnosis not present

## 2017-03-09 DIAGNOSIS — M1990 Unspecified osteoarthritis, unspecified site: Secondary | ICD-10-CM | POA: Insufficient documentation

## 2017-03-09 DIAGNOSIS — F17210 Nicotine dependence, cigarettes, uncomplicated: Secondary | ICD-10-CM | POA: Insufficient documentation

## 2017-03-09 DIAGNOSIS — Z8249 Family history of ischemic heart disease and other diseases of the circulatory system: Secondary | ICD-10-CM | POA: Insufficient documentation

## 2017-03-09 DIAGNOSIS — Z823 Family history of stroke: Secondary | ICD-10-CM | POA: Insufficient documentation

## 2017-03-09 DIAGNOSIS — I10 Essential (primary) hypertension: Secondary | ICD-10-CM | POA: Diagnosis not present

## 2017-03-09 DIAGNOSIS — K740 Hepatic fibrosis: Secondary | ICD-10-CM

## 2017-03-09 DIAGNOSIS — Z9049 Acquired absence of other specified parts of digestive tract: Secondary | ICD-10-CM | POA: Diagnosis not present

## 2017-03-09 DIAGNOSIS — Z833 Family history of diabetes mellitus: Secondary | ICD-10-CM | POA: Insufficient documentation

## 2017-03-09 LAB — COMPREHENSIVE METABOLIC PANEL
ALBUMIN: 4.1 g/dL (ref 3.5–5.0)
ALK PHOS: 77 U/L (ref 38–126)
ALT: 37 U/L (ref 17–63)
AST: 24 U/L (ref 15–41)
Anion gap: 10 (ref 5–15)
BILIRUBIN TOTAL: 0.6 mg/dL (ref 0.3–1.2)
BUN: 12 mg/dL (ref 6–20)
CO2: 24 mmol/L (ref 22–32)
CREATININE: 0.93 mg/dL (ref 0.61–1.24)
Calcium: 9.6 mg/dL (ref 8.9–10.3)
Chloride: 105 mmol/L (ref 101–111)
GFR calc Af Amer: >60 mL/min (ref >60)
GLUCOSE: 105 mg/dL — AB (ref 65–99)
POTASSIUM: 3.8 mmol/L (ref 3.5–5.1)
Sodium: 139 mmol/L (ref 135–145)
TOTAL PROTEIN: 7.3 g/dL (ref 6.5–8.1)

## 2017-03-09 LAB — PROTIME-INR
INR: 0.94
PROTHROMBIN TIME: 12.5 s (ref 11.4–15.2)

## 2017-03-09 LAB — CBC
HEMATOCRIT: 45.4 % (ref 39.0–52.0)
HEMOGLOBIN: 16 g/dL (ref 13.0–17.0)
MCH: 31.2 pg (ref 26.0–34.0)
MCHC: 35.2 g/dL (ref 30.0–36.0)
MCV: 88.5 fL (ref 78.0–100.0)
Platelets: 172 10*3/uL (ref 150–400)
RBC: 5.13 MIL/uL (ref 4.22–5.81)
RDW: 13.7 % (ref 11.5–15.5)
WBC: 9.2 10*3/uL (ref 4.0–10.5)

## 2017-03-09 LAB — APTT: aPTT: 28 seconds (ref 24–36)

## 2017-03-09 MED ORDER — SODIUM CHLORIDE 0.9 % IV SOLN: INTRAVENOUS | Status: DC

## 2017-03-09 MED ORDER — GELATIN ABSORBABLE 12-7 MM EX MISC
CUTANEOUS | Status: AC
  Filled 2017-03-09: qty 1

## 2017-03-09 MED ORDER — LIDOCAINE HCL (PF) 1 % IJ SOLN
INTRAMUSCULAR | Status: AC
  Filled 2017-03-09: qty 30

## 2017-03-09 MED ORDER — MIDAZOLAM HCL 2 MG/2ML IJ SOLN
INTRAMUSCULAR | Status: AC | PRN
  Administered 2017-03-09 (×2): 1 mg via INTRAVENOUS

## 2017-03-09 MED ORDER — SODIUM CHLORIDE 0.9 % IV SOLN
INTRAVENOUS | Status: AC | PRN
  Administered 2017-03-09: 10 mL/h via INTRAVENOUS

## 2017-03-09 MED ORDER — FENTANYL CITRATE (PF) 100 MCG/2ML IJ SOLN
INTRAMUSCULAR | Status: AC | PRN
  Administered 2017-03-09 (×2): 50 ug via INTRAVENOUS

## 2017-03-09 MED ORDER — LORAZEPAM 2 MG/ML IJ SOLN: INTRAMUSCULAR | Status: AC | PRN

## 2017-03-09 MED ORDER — FENTANYL CITRATE (PF) 100 MCG/2ML IJ SOLN
INTRAMUSCULAR | Status: AC
  Filled 2017-03-09: qty 2

## 2017-03-09 MED ORDER — MIDAZOLAM HCL 2 MG/2ML IJ SOLN
INTRAMUSCULAR | Status: AC
  Filled 2017-03-09: qty 2

## 2017-03-09 NOTE — Sedation Documentation (Signed)
Having some discomfort, sedation meds will be repeated

## 2017-03-09 NOTE — H&P (Signed)
    Chief Complaint: cirrhosis  Referring Physician:Dawn Drazek, NP  Supervising Physician: Irish LackYamagata, Glenn  [facility]  HPI: Stephen Fuller Kohan is a 62 y.o. male with a history of hep c and cirrhosis who has been followed by Annamarie Majorawn Drazek for several years.  Due to recent concerns about his liver, she has requested a random liver biopsy.  The patient denies any chest pain, SOB, fevers, chills, N/V, abdominal pain.  He presents today for this procedure.  Past Medical History:  Past Medical History:  Diagnosis Date  . Arthritis   . Chest pain   . Hepatitis C virus infection cured after antiviral drug therapy   . Hypertension   . Palpitations     Past Surgical History:  Past Surgical History:  Procedure Laterality Date  . CHOLECYSTECTOMY  2005    Family History:  Family History  Problem Relation Age of Onset  . Diabetes Mother   . Stroke Mother   . Hypertension Mother   . Diabetes Brother   . Heart disease Unknown        No family history  . Colon cancer Neg Hx     Social History:  reports that he has been smoking cigarettes.  He has a 15.00 pack-year smoking history. he has never used smokeless tobacco. He reports that he does not drink alcohol or use drugs.  Allergies:  Allergies  Allergen Reactions  . Morphine And Related Other (See Comments)    abd pain and inability to urinate    Medications: Medications reviewed in epic  Please HPI for pertinent positives, otherwise complete 10 system ROS negative.  Mallampati Score: MD Evaluation Airway: WNL Heart: WNL Abdomen: WNL Chest/ Lungs: WNL ASA  Classification: 2 Mallampati/Airway Score: One  Physical Exam: BP (!) 162/75   Pulse (!) 59   Temp 98.4 F (36.9 C) (Oral)   Resp 16   Ht 6' (1.829 m)   Wt 215 lb (97.5 kg)   SpO2 100%   BMI 29.16 kg/m  Body mass index is 29.16 kg/m. General: pleasant, WD, WN white male who is laying in bed in NAD HEENT: head is normocephalic, atraumatic.   Sclera are noninjected.  PERRL.  Ears and nose without any masses or lesions.  Mouth is pink and moist Heart: regular, rate, and rhythm.  Normal s1,s2. No obvious murmurs, gallops, or rubs noted.  Palpable radial pulses bilaterally Lungs: CTAB, no wheezes, rhonchi, or rales noted.  Respiratory effort nonlabored Abd: soft, NT, ND, +BS, no masses, hernias, or organomegaly Psych: A&Ox3 with an appropriate affect.   Labs: pending  Imaging: No results found.  Assessment/Plan 1. Hepatitis C, cirrhosis  Plan to proceed with a random liver biopsy.  Labs are pending, but vitals reviewed.  Risks and benefits discussed with the patient including, but not limited to bleeding, infection, damage to adjacent structures or low yield requiring additional tests. All of the patient's questions were answered, patient is agreeable to proceed. Consent signed and in chart.  Thank you for this interesting consult.  I greatly enjoyed meeting Stephen Fuller Sciascia and look forward to participating in their care.  A copy of this report was sent to the requesting provider on this date.  Electronically Signed: Letha CapeKelly E Azuri Bozard 03/09/2017, 8:04 AM   I spent a total of  30 Minutes   in face to face in clinical consultation, greater than 50% of which was counseling/coordinating care for hepatitis C, cirrhosis

## 2017-03-09 NOTE — Procedures (Signed)
Interventional Radiology Procedure Note  Procedure: US guided liver core biopsy  Complications: None  Estimated Blood Loss: < 10 mL  Findings: 18 G core biopsy x 2 via 17 G needle in right lobe of liver.  Jodi MarbleGlenn T. Fredia SorrowYamagata, M.D Pager:  [phone]

## 2017-03-09 NOTE — Sedation Documentation (Signed)
Patient is resting comfortably. 

## 2017-03-09 NOTE — Sedation Documentation (Signed)
02 d/c 

## 2017-03-09 NOTE — Discharge Instructions (Addendum)
Liver Biopsy, Care After °These instructions give you information on caring for yourself after your procedure. Your doctor may also give you more specific instructions. Call your doctor if you have any problems or questions after your procedure. °Follow these instructions at home: °· Rest at home for 1-2 days or as told by your doctor. °· Have someone stay with you for at least 24 hours. °· Do not do these things in the first 24 hours: °? Drive. °? Use machinery. °? Take care of other people. °? Sign legal documents. °? Take a bath or shower. °· There are many different ways to close and cover a cut (incision). For example, a cut can be closed with stitches, skin glue, or adhesive strips. Follow your doctor's instructions on: °? Taking care of your cut. °? Changing and removing your bandage (dressing). °? Removing whatever was used to close your cut. °· Do not drink alcohol in the first week. °· Do not lift more than 5 pounds or play contact sports for the first 2 weeks. °· Take medicines only as told by your doctor. For 1 week, do not take medicine that has aspirin in it or medicines like ibuprofen. °· Get your test results. °Contact a doctor if: °· A cut bleeds and leaves more than just a small spot of blood. °· A cut is red, puffs up (swells), or hurts more than before. °· Fluid or something else comes from a cut. °· A cut smells bad. °· You have a fever or chills. °Get help right away if: °· You have swelling, bloating, or pain in your belly (abdomen). °· You get dizzy or faint. °· You have a rash. °· You feel sick to your stomach (nauseous) or throw up (vomit). °· You have trouble breathing, feel short of breath, or feel faint. °· Your chest hurts. °· You have problems talking or seeing. °· You have trouble balancing or moving your arms or legs. °This information is not intended to replace advice given to you by your health care provider. Make sure you discuss any questions you have with your health care  provider. °Document Released: 11/10/2007 Document Revised: 07/09/2015 Document Reviewed: 03/29/2013 °Elsevier Interactive Patient Education © 2018 Elsevier Inc. ° ° ° ° °Moderate Conscious Sedation, Adult, Care After °These instructions provide you with information about caring for yourself after your procedure. Your health care provider may also give you more specific instructions. Your treatment has been planned according to current medical practices, but problems sometimes occur. Call your health care provider if you have any problems or questions after your procedure. °What can I expect after the procedure? °After your procedure, it is common: °· To feel sleepy for several hours. °· To feel clumsy and have poor balance for several hours. °· To have poor judgment for several hours. °· To vomit if you eat too soon. ° °Follow these instructions at home: °For at least 24 hours after the procedure: ° °· Do not: °? Participate in activities where you could fall or become injured. °? Drive. °? Use heavy machinery. °? Drink alcohol. °? Take sleeping pills or medicines that cause drowsiness. °? Make important decisions or sign legal documents. °? Take care of children on your own. °· Rest. °Eating and drinking °· Follow the diet recommended by your health care provider. °· If you vomit: °? Drink water, juice, or soup when you can drink without vomiting. °? Make sure you have little or no nausea before eating solid foods. °General instructions °·   Have a responsible adult stay with you until you are awake and alert. °· Take over-the-counter and prescription medicines only as told by your health care provider. °· If you smoke, do not smoke without supervision. °· Keep all follow-up visits as told by your health care provider. This is important. °Contact a health care provider if: °· You keep feeling nauseous or you keep vomiting. °· You feel light-headed. °· You develop a rash. °· You have a fever. °Get help right away  if: °· You have trouble breathing. °This information is not intended to replace advice given to you by your health care provider. Make sure you discuss any questions you have with your health care provider. °Document Released: 11/21/2012 Document Revised: 07/06/2015 Document Reviewed: 05/23/2015 °Elsevier Interactive Patient Education © 2018 Elsevier Inc. ° ° °

## 2017-03-09 NOTE — Sedation Documentation (Signed)
Patient denies pain and is resting comfortably.  

## 2017-03-16 ENCOUNTER — Telehealth: Payer: Self-pay | Admitting: Orthopaedic Surgery

## 2017-03-16 MED ORDER — HYDROCODONE-ACETAMINOPHEN 7.5-325 MG PO TABS: ORAL_TABLET | ORAL | 0 refills | Status: DC

## 2017-03-16 NOTE — Telephone Encounter (Signed)
Patient requests refill on Hydrocodone/Acetaminopth  7.5-325  Mgs.   Qty  28  Sig: One every six hours as needed for pain. Seven day limit  [facility] [place]

## 2017-03-27 ENCOUNTER — Telehealth: Payer: Self-pay | Admitting: Orthopaedic Surgery

## 2017-03-27 NOTE — Telephone Encounter (Signed)
Patient requests refill on Hydrocodone/Acetaminophen 7.5-325   Mgs.   Qty  28  Sig: One every six hours as needed for pain. Seven day limit  Patient states he [place] [facility] [place]

## 2017-03-28 MED ORDER — HYDROCODONE-ACETAMINOPHEN 7.5-325 MG PO TABS: ORAL_TABLET | ORAL | 0 refills | Status: DC

## 2017-04-05 ENCOUNTER — Telehealth: Payer: Self-pay | Admitting: Orthopaedic Surgery

## 2017-04-05 MED ORDER — HYDROCODONE-ACETAMINOPHEN 7.5-325 MG PO TABS: ORAL_TABLET | ORAL | 0 refills | Status: DC

## 2017-04-05 NOTE — Telephone Encounter (Signed)
Hydrocodone-Acetaminophen 7.5/325 mg  Qty 28 Tablets   PATIENT USES [facility] [place]

## 2017-04-06 ENCOUNTER — Ambulatory Visit: Payer: BLUE CROSS/BLUE SHIELD | Admitting: Orthopaedic Surgery

## 2017-04-06 ENCOUNTER — Encounter: Payer: Self-pay | Admitting: Orthopaedic Surgery

## 2017-04-06 VITALS — BP 180/83 | HR 68 | Ht 72.0 in | Wt 216.0 lb

## 2017-04-06 DIAGNOSIS — G8929 Other chronic pain: Secondary | ICD-10-CM | POA: Diagnosis not present

## 2017-04-06 DIAGNOSIS — M25511 Pain in right shoulder: Secondary | ICD-10-CM

## 2017-04-06 NOTE — Patient Instructions (Signed)
Steps to Quit Smoking Smoking tobacco can be bad for your health. It can also affect almost every organ in your body. Smoking puts you and people around you at risk for many serious long-lasting (chronic) diseases. Quitting smoking is hard, but it is one of the best things that you can do for your health. It is never too late to quit. What are the benefits of quitting smoking? When you quit smoking, you lower your risk for getting serious diseases and conditions. They can include:  Lung cancer or lung disease.  Heart disease.  Stroke.  Heart attack.  Not being able to have children (infertility).  Weak bones (osteoporosis) and broken bones (fractures).  If you have coughing, wheezing, and shortness of breath, those symptoms may get better when you quit. You may also get sick less often. If you are pregnant, quitting smoking can help to lower your chances of having a baby of low birth weight. What can I do to help me quit smoking? Talk with your doctor about what can help you quit smoking. Some things you can do (strategies) include:  Quitting smoking totally, instead of slowly cutting back how much you smoke over a period of time.  Going to in-person counseling. You are more likely to quit if you go to many counseling sessions.  Using resources and support systems, such as: ? Online chats with a counselor. ? Phone quitlines. ? Printed self-help materials. ? Support groups or group counseling. ? Text messaging programs. ? Mobile phone apps or applications.  Taking medicines. Some of these medicines may have nicotine in them. If you are pregnant or breastfeeding, do not take any medicines to quit smoking unless your doctor says it is okay. Talk with your doctor about counseling or other things that can help you.  Talk with your doctor about using more than one strategy at the same time, such as taking medicines while you are also going to in-person counseling. This can help make  quitting easier. What things can I do to make it easier to quit? Quitting smoking might feel very hard at first, but there is a lot that you can do to make it easier. Take these steps:  Talk to your family and friends. Ask them to support and encourage you.  Call phone quitlines, reach out to support groups, or work with a counselor.  Ask people who smoke to not smoke around you.  Avoid places that make you want (trigger) to smoke, such as: ? Bars. ? Parties. ? Smoke-break areas at work.  Spend time with people who do not smoke.  Lower the stress in your life. Stress can make you want to smoke. Try these things to help your stress: ? Getting regular exercise. ? Deep-breathing exercises. ? Yoga. ? Meditating. ? Doing a body scan. To do this, close your eyes, focus on one area of your body at a time from head to toe, and notice which parts of your body are tense. Try to relax the muscles in those areas.  Download or buy apps on your mobile phone or tablet that can help you stick to your quit plan. There are many free apps, such as QuitGuide from the CDC (Centers for Disease Control and Prevention). You can find more support from smokefree.gov and other websites.  This information is not intended to replace advice given to you by your health care provider. Make sure you discuss any questions you have with your health care provider. Document Released: 11/27/2008 Document   Revised: 09/29/2015 Document Reviewed: 06/17/2014 Elsevier Interactive Patient Education  2018 Elsevier Inc.  

## 2017-04-06 NOTE — Progress Notes (Signed)
Patient ZO:XWRUEAV:Stephen Fuller, male DOB:1955-09-15, 62 y.o. WUJ:[id number]  Chief Complaint  Patient presents with  . Shoulder Pain    right     HPI  [name] is a 62 y.o. male who has continued pain in the right shoulder.  He has pain with overhead use and extension.  He has no numbness.  He has no new trauma.  It has been hurting for some time now.  I have given injection in past and he has been taking his medicine.  I will get a MRI of the shoulder now. HPI  Body mass index is 29.29 kg/m.  ROS  Review of Systems  HENT: Negative for congestion.   Respiratory: Negative for cough and shortness of breath.   Cardiovascular: Negative for chest pain and leg swelling.  Musculoskeletal: Positive for arthralgias.  All other systems reviewed and are negative.   Past Medical History:  Diagnosis Date  . Arthritis   . Chest pain   . Hepatitis C virus infection cured after antiviral drug therapy   . Hypertension   . Palpitations     Past Surgical History:  Procedure Laterality Date  . CHOLECYSTECTOMY  2005    Family History  Problem Relation Age of Onset  . Diabetes Mother   . Stroke Mother   . Hypertension Mother   . Diabetes Brother   . Heart disease Unknown        No family history  . Colon cancer Neg Hx     Social History Social History   Tobacco Use  . Smoking status: Current Every Day Smoker    Packs/day: 0.50    Years: 30.00    Pack years: 15.00    Types: Cigarettes  . Smokeless tobacco: Never Used  Substance Use Topics  . Alcohol use: No    Comment: Rare  . Drug use: No    Allergies  Allergen Reactions  . Morphine And Related Other (See Comments)    abd pain and inability to urinate    Current Outpatient Medications  Medication Sig Dispense Refill  . aspirin EC 81 MG tablet Take 1 tablet (81 mg total) by mouth daily.    Marland Kitchen. HYDROcodone-acetaminophen (NORCO) 7.5-325 MG tablet One every six hours as needed for pain.  Seven day limit 21 tablet 0   . metoprolol tartrate (LOPRESSOR) 50 MG tablet Take 1 tablet by mouth 2 (two) times daily.  4  . naproxen (NAPROSYN) 500 MG tablet Take 1 tablet (500 mg total) by mouth 2 (two) times daily with a meal. 60 tablet 5   Current Facility-Administered Medications  Medication Dose Route Frequency Provider Last Rate Last Dose  . 0.9 %  sodium chloride infusion  500 mL Intravenous Continuous Sherrilyn Ristanis, Henry L III, MD         Physical Exam  Blood pressure (!) 180/83, pulse 68, height 6' (1.829 m), weight 216 lb (98 kg).  Constitutional: overall normal hygiene, normal nutrition, well developed, normal grooming, normal body habitus. Assistive device:none  Musculoskeletal: gait and station Limp none, muscle tone and strength are normal, no tremors or atrophy is present.  .  Neurological: coordination overall normal.  Deep tendon reflex/nerve stretch intact.  Sensation normal.  Cranial nerves II-XII intact.   Skin:   Normal overall no scars, lesions, ulcers or rashes. No psoriasis.  Psychiatric: Alert and oriented x 3.  Recent memory intact, remote memory unclear.  Normal mood and affect. Well groomed.  Good eye contact.  Cardiovascular: overall no  swelling, no varicosities, no edema bilaterally, normal temperatures of the legs and arms, no clubbing, cyanosis and good capillary refill.  Lymphatic: palpation is normal.  Examination of right Upper Extremity is done.  Inspection:   Overall:  Elbow non-tender without crepitus or defects, forearm non-tender without crepitus or defects, wrist non-tender without crepitus or defects, hand non-tender.    Shoulder: with glenohumeral joint tenderness, without effusion.   Upper arm: without swelling and tenderness   Range of motion:   Overall:  Full range of motion of the elbow, full range of motion of wrist and full range of motion in fingers.   Shoulder:  right  160 degrees forward flexion; 145 degrees abduction; 35 degrees internal rotation, 35 degrees  external rotation, 5 degrees extension, 40 degrees adduction.   Stability:   Overall:  Shoulder, elbow and wrist stable   Strength and Tone:   Overall full shoulder muscles strength, full upper arm strength and normal upper arm bulk and tone.  All other systems reviewed and are negative   The patient has been educated about the nature of the problem(s) and counseled on treatment options.  The patient appeared to understand what I have discussed and is in agreement with it.  Encounter Diagnosis  Name Primary?  . Pain in joint of right shoulder Yes    PLAN Call if any problems.  Precautions discussed.  Continue current medications.   Return to clinic after MRI of the right shoulder   Electronically Signed Darreld Mclean, MD 2/21/20198:20 AM

## 2017-04-13 ENCOUNTER — Other Ambulatory Visit: Payer: Self-pay | Admitting: Orthopaedic Surgery

## 2017-04-13 NOTE — Telephone Encounter (Signed)
Hydrocodone-Acetaminophen 7.5/325MG   Qty 21 Tablets  One every six hours as needed for pain. Seven day limit.   PATIENT USES [facility].

## 2017-04-14 NOTE — Telephone Encounter (Signed)
Another one too late   Rx Monday

## 2017-04-16 ENCOUNTER — Ambulatory Visit
Admission: RE | Admit: 2017-04-16 | Discharge: 2017-04-16 | Disposition: A | Discharge status: Home / Self Care | Payer: BLUE CROSS/BLUE SHIELD | Source: Ambulatory Visit | Attending: Orthopaedic Surgery | Admitting: Orthopaedic Surgery

## 2017-04-16 DIAGNOSIS — G8929 Other chronic pain: Secondary | ICD-10-CM

## 2017-04-16 DIAGNOSIS — M25511 Pain in right shoulder: Principal | ICD-10-CM

## 2017-04-18 MED ORDER — HYDROCODONE-ACETAMINOPHEN 7.5-325 MG PO TABS: ORAL_TABLET | ORAL | 0 refills | Status: DC

## 2017-04-18 NOTE — Telephone Encounter (Signed)
-----   Message from Vickki HearingStanley E Harrison, MD sent at 04/14/2017  7:49 AM EST ----- rx

## 2017-04-19 ENCOUNTER — Ambulatory Visit: Payer: BLUE CROSS/BLUE SHIELD | Admitting: Orthopedic Surgery

## 2017-04-19 VITALS — BP 157/98 | HR 73 | Ht 72.0 in | Wt 216.0 lb

## 2017-04-19 DIAGNOSIS — G8929 Other chronic pain: Secondary | ICD-10-CM

## 2017-04-19 DIAGNOSIS — M7521 Bicipital tendinitis, right shoulder: Secondary | ICD-10-CM

## 2017-04-19 DIAGNOSIS — M19011 Primary osteoarthritis, right shoulder: Secondary | ICD-10-CM | POA: Diagnosis not present

## 2017-04-19 DIAGNOSIS — M75111 Incomplete rotator cuff tear or rupture of right shoulder, not specified as traumatic: Secondary | ICD-10-CM

## 2017-04-19 DIAGNOSIS — M25511 Pain in right shoulder: Secondary | ICD-10-CM

## 2017-04-19 NOTE — Progress Notes (Signed)
MRI follow-up right shoulder  Patient of Dr. Sanjuan DameKeeling's previous treatment includes injection home exercise program Naprosyn and hydrocodone  I have reviewed the MRI he has bulky arthritis of his acromial clavicular joint with impingement of the rotator cuff and biceps tendon tendinitis intra-articularly with bursal side rotator cuff tear partial and tendinosis throughout the rotator cuff tendons  He says his pain is stable at this point although he has stopped taking Naprosyn because of information he received from his GI doctor regarding his liver.  He is still on his hydrocodone 7.5 mg as needed and he is doing his exercises with a home exercise program  FINDINGS: Rotator cuff: Mild tendinosis of the supraspinatus tendon with a small partial-thickness tear of the bursal surface. Mild tendinosis of the infraspinatus tendon. Teres minor tendon is intact. Mild tendinosis of the subscapularis tendon.   Muscles: No atrophy or fatty replacement of nor abnormal signal within, the muscles of the rotator cuff.   Biceps long head: Moderate tendinosis of the intra-articular portion of the long head of the biceps tendon.   Acromioclavicular Joint: Moderate arthropathy of the acromioclavicular joint. Type II acromion. No significant subacromial/subdeltoid bursal fluid.   Glenohumeral Joint: No joint effusion.  No chondral defect.   Labrum: Limited evaluation secondary lack of intra-articular fluid. Degeneration of the superior posterior labrum with possible small degenerative tear.   Bones:  No acute osseous abnormality.  No aggressive osseous lesion.   Other: No fluid collection or hematoma.   IMPRESSION: 1. Mild tendinosis of the supraspinatus tendon with a small partial-thickness tear of the bursal surface. 2. Mild tendinosis of the infraspinatus tendon. 3. Mild tendinosis of the subscapularis tendon. 4. Mild tendinosis of the intra-articular portion of the long head of the biceps  tendon.   Encounter Diagnoses  Name Primary?  . Chronic right shoulder pain Yes  . Incomplete tear of right rotator cuff   . Arthritis of right acromioclavicular joint   . Biceps tendinitis of right shoulder     Cuff tear is not repairable tear it is a partial tear he can be followed.  He will call us if his pain gets worse otherwise he will see Dr. Hilda LiasKeeling in 3 months and continue on his current regimen.  I did give him an opioid warning.

## 2017-04-19 NOTE — Patient Instructions (Signed)
1.  Tendinitis biceps tendon  #2 tendinitis rotator cuff with partial tear   #3 arthritis   Continue exercises and medication call us if pain gets worse otherwise we will see Dr. Hilda LiasKeeling on his return

## 2017-04-25 ENCOUNTER — Telehealth: Payer: Self-pay | Admitting: Hematology

## 2017-04-25 ENCOUNTER — Encounter: Payer: Self-pay | Admitting: Hematology

## 2017-04-25 NOTE — Telephone Encounter (Signed)
Appt has been scheduled for the pt to see Dr. Candise CheKale on 3/26 at 10am. Pt aware to arrive 30 minutes early. Letter mailed.

## 2017-04-27 ENCOUNTER — Other Ambulatory Visit: Payer: Self-pay | Admitting: Orthopedic Surgery

## 2017-04-27 MED ORDER — HYDROCODONE-ACETAMINOPHEN 7.5-325 MG PO TABS: ORAL_TABLET | ORAL | 0 refills | Status: DC

## 2017-04-27 NOTE — Telephone Encounter (Signed)
°  °

## 2017-05-04 ENCOUNTER — Other Ambulatory Visit: Payer: Self-pay | Admitting: Orthopaedic Surgery

## 2017-05-04 MED ORDER — HYDROCODONE-ACETAMINOPHEN 7.5-325 MG PO TABS: ORAL_TABLET | ORAL | 0 refills | Status: DC

## 2017-05-04 NOTE — Telephone Encounter (Signed)
°  °  Patient requests refill on Hydrocodone/Acetaminophen 7.5-325  Mgs.   Qty  21  Sig: One every six hours as needed for pain. Seven day limit  Patient states [facility] [place]

## 2017-05-04 NOTE — Progress Notes (Signed)
HEMATOLOGY/ONCOLOGY CONSULTATION NOTE  Date of Service: 05/09/2017  Patient Care Team: Fleet Contras, MD as PCP - General (Internal Medicine) Laqueta Linden, MD as Attending Physician (Cardiology)  CHIEF COMPLAINTS/PURPOSE OF CONSULTATION:  Hereditary Hemochromatosis  HISTORY OF PRESENTING ILLNESS:   Stephen Fuller is a wonderful 62 y.o. male who has been referred to [place] by NP Annamarie Major for evaluation and management of hereditary hemochromatosis. His PCP is Dr. Fleet Contras  He presents to the clinic today accompanied by his wife.   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 Liver clinic noted iron overload within liver and elevated ferritin so they referred him to hematology.   In the past he was diagnosed with Hep C in 2018 and was treated with Harvoni s/p 3 cycles. He completed treatment about 8 months ago. This was found by abnormal LFTs. He has used needles from drug use with [employer] and stopped in his late teens. This is likely to have led to his Hep C.   He has HTN on Lopressor and has had his gallbladder removed. He notes he gets sick with flu like symptoms over the past 2 years. He only drinks occasionally 1 drink 1-2 times a week. 2.5 years ago he was a heavy drinker with a 6 pack of beer a day. He has worked with [employer] for 40 years. He had a lung Xray 5 years ago which was clear. He is a longtime smoker since the age of 75. He smokes about 1 pack a day currently. He has had URIs from this. He notes erectile dysfunction for 2 years now. He notes he lives in [place] and would like to get treated closer to home likely at [facility].   Pt wonders given his degenerative disc disease and his shoulder issues can he get on disability. He will see orthopedist about this.   On review of symptoms, pt notes right shoulder  problems. Does not plan to get surgery for this. He notes fatigue, back pain from degenerative disc disease and upper leg pain b/l. Pt denies abdominal pain, no current joint pain. He denies issues with urination or bowel movements. He notes erectile dysfunction.    MEDICAL HISTORY:  Past Medical History:  Diagnosis Date  . Arthritis   . Chest pain   . Hepatitis C virus infection cured after antiviral drug therapy   . Hypertension   . Palpitations     SURGICAL HISTORY: Past Surgical History:  Procedure Laterality Date  . CHOLECYSTECTOMY  2005    SOCIAL HISTORY: Social History   Socioeconomic History  . Marital status: Married    Spouse name: Not on file  . Number of children: 2  . Years of education: Not on file  . Highest education level: Not on file  Occupational History  . Occupation: UNEMPLOYED  Social Needs  . Financial resource strain: Not on file  . Food insecurity:    Worry: Not on file    Inability: Not on file  . Transportation needs:    Medical: Not on file    Non-medical: Not on file  Tobacco Use  . Smoking status: Current Every Day Smoker    Packs/day: 1.00    Years: 30.00    Pack years: 30.00    Types: Cigarettes  . Smokeless tobacco: Never Used  Substance and Sexual Activity  . Alcohol use: Yes  Alcohol/week: 1.2 oz    Types: 2 Shots of liquor per week    Comment: Occasional  . Drug use: No  . Sexual activity: Not on file  Lifestyle  . Physical activity:    Days per week: Not on file    Minutes per session: Not on file  . Stress: Not on file  Relationships  . Social connections:    Talks on phone: Not on file    Gets together: Not on file    Attends religious service: Not on file    Active member of club or organization: Not on file    Attends meetings of clubs or organizations: Not on file    Relationship status: Not on file  . Intimate partner violence:    Fear of current or ex partner: Not on file    Emotionally abused: Not on  file    Physically abused: Not on file    Forced sexual activity: Not on file  Other Topics Concern  . Not on file  Social History Narrative  . Not on file    FAMILY HISTORY: Family History  Problem Relation Age of Onset  . Diabetes Mother   . Stroke Mother   . Hypertension Mother   . Diabetes Brother   . Heart disease Unknown        No family history  . Colon cancer Neg Hx     ALLERGIES:  is allergic to morphine and related.  MEDICATIONS:  Current Outpatient Medications  Medication Sig Dispense Refill  . aspirin EC 81 MG tablet Take 1 tablet (81 mg total) by mouth daily.    Marland Kitchen. HYDROcodone-acetaminophen (NORCO) 7.5-325 MG tablet One every six hours as needed for pain.  Seven day limit 21 tablet 0  . metoprolol tartrate (LOPRESSOR) 50 MG tablet Take 1 tablet by mouth 2 (two) times daily.  4  . naproxen (NAPROSYN) 500 MG tablet Take 1 tablet (500 mg total) by mouth 2 (two) times daily with a meal. 60 tablet 5   Current Facility-Administered Medications  Medication Dose Route Frequency Provider Last Rate Last Dose  . 0.9 %  sodium chloride infusion  500 mL Intravenous Continuous Danis, Starr LakeHenry L III, MD        REVIEW OF SYSTEMS:    .10 Point review of Systems was done is negative except as noted above.  PHYSICAL EXAMINATION: ECOG PERFORMANCE STATUS: 0 - Asymptomatic  . Vitals:   05/09/17 1010  BP: (!) 184/103  Pulse: 61  Resp: 18  Temp: 97.7 F (36.5 C)  SpO2: 100%   Filed Weights   05/09/17 1010  Weight: 210 lb 11.2 oz (95.6 kg)   .Body mass index is 28.58 kg/m.   Marland Kitchen. GENERAL:alert, in no acute distress and comfortable SKIN: no acute rashes, no significant lesions EYES: conjunctiva are pink and non-injected, sclera anicteric OROPHARYNX: MMM, no exudates, no oropharyngeal erythema or ulceration NECK: supple, no JVD LYMPH:  no palpable lymphadenopathy in the cervical, axillary or inguinal regions LUNGS: clear to auscultation b/l with normal respiratory  effort HEART: regular rate & rhythm ABDOMEN:  normoactive bowel sounds , non tender, not distended. Extremity: no pedal edema PSYCH: alert & oriented x 3 with fluent speech NEURO: no focal motor/sensory deficits   LABORATORY DATA:  I have reviewed the data as listed  . CBC Latest Ref Rng & Units 05/09/2017 03/09/2017  WBC 4.0 - 10.3 K/uL 6.2 9.2  Hemoglobin 13.0 - 17.0 g/dL - 16.116.0  Hematocrit 09.638.4 - 49.9 %  44.1 45.4  Platelets 140 - 400 K/uL 165 172  HGB 15.3  . CMP Latest Ref Rng & Units 03/09/2017 08/28/2012  Glucose 65 - 99 mg/dL 161(W) 86  BUN 6 - 20 mg/dL 12 96(E)  Creatinine 4.54 - 1.24 mg/dL 0.98 1.19  Sodium 147 - 145 mmol/L 139 141  Potassium 3.5 - 5.1 mmol/L 3.8 4.5  Chloride 101 - 111 mmol/L 105 106  CO2 22 - 32 mmol/L 24 27  Calcium 8.9 - 10.3 mg/dL 9.6 82.9  Total Protein 6.5 - 8.1 g/dL 7.3 7.0  Total Bilirubin 0.3 - 1.2 mg/dL 0.6 0.4  Alkaline Phos 38 - 126 U/L 77 78  AST 15 - 41 U/L 24 39(H)  ALT 17 - 63 U/L 37 61(H)   . Lab Results  Component Value Date   IRON 84 05/09/2017   TIBC 334 05/09/2017   IRONPCTSAT 25 (L) 05/09/2017   (Iron and TIBC)  Lab Results  Component Value Date   FERRITIN 210 05/09/2017    OUTSIDE LABS 02/16/17:     PATHOLOGY        Liver biopsy 03/09/17 DIAGNOSIS Diagnosis Liver, needle/core biopsy, Right lobe - CHRONIC ACTIVE HEPATITIS WITH FOCAL PORTAL AND FOCAL PERIPORTAL FIBROSIS. - INCREASED HEPATOCYTE IRON. - SEE MICROSCOPIC DESCRIPTION. Microscopic Comment Many of the portal areas have increased chronic inflammation with foci of interface change and a few small foci of lobular chronic inflammation. Trichrome and reticulin stains show portal, focal periportal and a rare focus of early bridging fibrosis consistent with fibrosis stage II. Iron stain shows increased iron predominantly within hepatocytes and the differential includes genetic hemochromatosis. The inflammatory stage is II. No alpha-1 antitrypsin deposits  are identified with PAS stain. (JDP:ecj 03/10/2017)   RADIOGRAPHIC STUDIES: I have personally reviewed the radiological images as listed and agreed with the findings in the report. Mr Shoulder Right Wo Contrast  Result Date: 04/17/2017 CLINICAL DATA:  Decreased range of motion. Sharp pain in the right shoulder. EXAM: MRI OF THE RIGHT SHOULDER WITHOUT CONTRAST TECHNIQUE: Multiplanar, multisequence MR imaging of the shoulder was performed. No intravenous contrast was administered. COMPARISON:  None. FINDINGS: Rotator cuff: Mild tendinosis of the supraspinatus tendon with a small partial-thickness tear of the bursal surface. Mild tendinosis of the infraspinatus tendon. Teres minor tendon is intact. Mild tendinosis of the subscapularis tendon. Muscles: No atrophy or fatty replacement of nor abnormal signal within, the muscles of the rotator cuff. Biceps long head: Moderate tendinosis of the intra-articular portion of the long head of the biceps tendon. Acromioclavicular Joint: Moderate arthropathy of the acromioclavicular joint. Type II acromion. No significant subacromial/subdeltoid bursal fluid. Glenohumeral Joint: No joint effusion.  No chondral defect. Labrum: Limited evaluation secondary lack of intra-articular fluid. Degeneration of the superior posterior labrum with possible small degenerative tear. Bones:  No acute osseous abnormality.  No aggressive osseous lesion. Other: No fluid collection or hematoma. IMPRESSION: 1. Mild tendinosis of the supraspinatus tendon with a small partial-thickness tear of the bursal surface. 2. Mild tendinosis of the infraspinatus tendon. 3. Mild tendinosis of the subscapularis tendon. 4. Mild tendinosis of the intra-articular portion of the long head of the biceps tendon. Electronically Signed   By: Elige Ko   On: 04/17/2017 08:12    ASSESSMENT & PLAN:  JARIAN LONGORIA is a 62 y.o. Caucasian male with   1. Hereditary hemochromatosis with gene mutation C282Y  heterozygous.  -outside 03/27/17 labs which showed transferrin saturation 26%, ferritin 229   PLAN:  -I discussed his  diagnosis of Hereditary Hemochromatosis with pt and his wife. He has one of the gene mutations, C282Y, which puts him in a carrier state. -I explained his current elevated iron levels can further injure his Liver.  -I discussed to adjust his elevated iron levels to a Ferritin between 50-100 which we would treat by phlebotomies while watching Hg levels to avoid anemia.  -Will get baseline labs today to determine frequency of phlebotomies. Will likely start every 1-2 weeks -Once close to normal levels of Ferritin will continue with reduced maintenance phlebotomies.  -Given the pt lives in [place], will refer him to [facility] for transfer of care to have phlebotomies set up there.    2. Chronic Hepatitis C -s/p 3 cycles of Harvoni treatment with cure and sustained virologic response  3. 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   All of the patients questions were answered with apparent satisfaction. The patient knows to call the clinic with any problems, questions or concerns.  I spent 35  minutes counseling the patient face to face. The total time spent in the appointment was 45 minutes and more than 50% was on counseling and direct patient cares.    Wyvonnia Lora MD MS AAHIVMS [facility] Hematology/Oncology Physician [facility]  (Office):       [phone] (Work cell):  [phone] (Fax):           [phone]  05/09/2017 11:00 AM   This document serves as a record of services personally performed by Wyvonnia Lora, MD. It was created on his behalf by Delphina Cahill, a trained medical scribe. The creation of this record is based on the scribe's personal observations and  the provider's statements to them.    .I have reviewed the above documentation for accuracy and completeness, and I agree with the above. Johney Maine MD MS

## 2017-05-09 ENCOUNTER — Inpatient Hospital Stay: Payer: BLUE CROSS/BLUE SHIELD

## 2017-05-09 ENCOUNTER — Inpatient Hospital Stay: Payer: BLUE CROSS/BLUE SHIELD | Attending: Hematology | Admitting: Hematology

## 2017-05-09 ENCOUNTER — Telehealth: Payer: Self-pay

## 2017-05-09 ENCOUNTER — Encounter: Payer: Self-pay | Admitting: Hematology

## 2017-05-09 DIAGNOSIS — I10 Essential (primary) hypertension: Secondary | ICD-10-CM

## 2017-05-09 DIAGNOSIS — K740 Hepatic fibrosis: Secondary | ICD-10-CM | POA: Diagnosis not present

## 2017-05-09 DIAGNOSIS — N529 Male erectile dysfunction, unspecified: Secondary | ICD-10-CM | POA: Diagnosis not present

## 2017-05-09 DIAGNOSIS — B182 Chronic viral hepatitis C: Secondary | ICD-10-CM | POA: Diagnosis not present

## 2017-05-09 LAB — CMP (CANCER CENTER ONLY)
ALT: 35 U/L (ref 0–55)
AST: 23 U/L (ref 5–34)
Albumin: 4.2 g/dL (ref 3.5–5.0)
Alkaline Phosphatase: 89 U/L (ref 40–150)
Anion gap: 7 (ref 3–11)
BUN: 8 mg/dL (ref 7–26)
CO2: 27 mmol/L (ref 22–29)
CREATININE: 0.97 mg/dL (ref 0.70–1.30)
Calcium: 9.9 mg/dL (ref 8.4–10.4)
Chloride: 107 mmol/L (ref 98–109)
GFR, Est AFR Am: >60 mL/min (ref >60)
GFR, Estimated: >60 mL/min (ref >60)
Glucose, Bld: 109 mg/dL (ref 70–140)
POTASSIUM: 4.3 mmol/L (ref 3.5–5.1)
SODIUM: 141 mmol/L (ref 136–145)
Total Bilirubin: 0.4 mg/dL (ref 0.2–1.2)
Total Protein: 8.3 g/dL (ref 6.4–8.3)

## 2017-05-09 LAB — RETICULOCYTES
RBC.: 4.98 MIL/uL (ref 4.20–5.82)
RETIC COUNT ABSOLUTE: 49.8 10*3/uL (ref 34.8–93.9)
RETIC CT PCT: 1 % (ref 0.8–1.8)

## 2017-05-09 LAB — CBC WITH DIFFERENTIAL (CANCER CENTER ONLY)
BASOS ABS: 0.1 10*3/uL (ref 0.0–0.1)
BASOS PCT: 1 %
EOS PCT: 4 %
Eosinophils Absolute: 0.3 10*3/uL (ref 0.0–0.5)
HCT: 44.1 % (ref 38.4–49.9)
Hemoglobin: 15.3 g/dL (ref 13.0–17.1)
LYMPHS PCT: 56 %
Lymphs Abs: 3.4 10*3/uL — ABNORMAL HIGH (ref 0.9–3.3)
MCH: 30.7 pg (ref 27.2–33.4)
MCHC: 34.7 g/dL (ref 32.0–36.0)
MCV: 88.6 fL (ref 79.3–98.0)
MONO ABS: 0.4 10*3/uL (ref 0.1–0.9)
Monocytes Relative: 6 %
NEUTROS ABS: 2.1 10*3/uL (ref 1.5–6.5)
Neutrophils Relative %: 33 %
PLATELETS: 165 10*3/uL (ref 140–400)
RBC: 4.98 MIL/uL (ref 4.20–5.82)
RDW: 13.9 % (ref 11.0–14.6)
WBC: 6.2 10*3/uL (ref 4.0–10.3)

## 2017-05-09 LAB — IRON AND TIBC
IRON: 84 ug/dL (ref 42–163)
SATURATION RATIOS: 25 % — AB (ref 42–163)
TIBC: 334 ug/dL (ref 202–409)
UIBC: 250 ug/dL

## 2017-05-09 LAB — FERRITIN: Ferritin: 210 ng/mL (ref 22–316)

## 2017-05-09 NOTE — Telephone Encounter (Signed)
Printed avs and calender of upcoming appointment. Per 3/26 los 

## 2017-05-11 ENCOUNTER — Telehealth: Payer: Self-pay

## 2017-05-11 ENCOUNTER — Other Ambulatory Visit: Payer: Self-pay | Admitting: Orthopedic Surgery

## 2017-05-11 ENCOUNTER — Other Ambulatory Visit: Payer: Self-pay | Admitting: Orthopaedic Surgery

## 2017-05-11 MED ORDER — HYDROCODONE-ACETAMINOPHEN 5-325 MG PO TABS: 1.0000 | ORAL_TABLET | Freq: Three times a day (TID) | ORAL | 0 refills | Status: DC | PRN

## 2017-05-11 NOTE — Telephone Encounter (Signed)
Dr. Candise CheKale requested for pt's care to [facility] Called and left VM on main line [phone] stating the patient's name, DOB, MRN, diagnosis, and interest [facility] [place]. Requested call back for confirmation of receipt and pt transfer to [phone].

## 2017-05-11 NOTE — Telephone Encounter (Signed)
d 

## 2017-05-11 NOTE — Telephone Encounter (Signed)
Hydrocodone-Acetaminophen  7.5/325 mg  Qty 21 Tablets  One every six hours as needed for pain. Seven day limit.  PATIENT US[place] [facility] [place]

## 2017-05-18 ENCOUNTER — Other Ambulatory Visit: Payer: Self-pay | Admitting: Orthopedic Surgery

## 2017-05-18 MED ORDER — HYDROCODONE-ACETAMINOPHEN 7.5-325 MG PO TABS: ORAL_TABLET | ORAL | 0 refills | Status: DC

## 2017-05-18 NOTE — Telephone Encounter (Signed)
Patient of Dr. Sanjuan DameKeeling's requests refill on Hydrocodone/Acetaminophen 5-325  Mgs.   Qty  21       Sig: Take 1 tablet by mouth every 8 (eight) hours as needed for moderate pain.          Patient states he uses [facility]

## 2017-05-25 ENCOUNTER — Other Ambulatory Visit: Payer: Self-pay | Admitting: Orthopaedic Surgery

## 2017-05-25 MED ORDER — HYDROCODONE-ACETAMINOPHEN 7.5-325 MG PO TABS: ORAL_TABLET | ORAL | 0 refills | Status: DC

## 2017-05-25 NOTE — Telephone Encounter (Signed)
Hydrocodone-Acetaminophen  7.5/325 mg  Qty 21 Tablets ° °One every six hours as needed for pain. Seven day limit. ° °PATIENT USES [facility] °

## 2017-06-01 ENCOUNTER — Other Ambulatory Visit: Payer: Self-pay | Admitting: Orthopaedic Surgery

## 2017-06-01 MED ORDER — HYDROCODONE-ACETAMINOPHEN 7.5-325 MG PO TABS: ORAL_TABLET | ORAL | 0 refills | Status: DC

## 2017-06-01 NOTE — Telephone Encounter (Signed)
Hydrocodone-Acetaminophen  7.5/325 mg  Qty 21 Tablets  One every six hours as needed for pain. Seven day limit.  PATIENT USES [facility]

## 2017-06-08 ENCOUNTER — Other Ambulatory Visit: Payer: Self-pay | Admitting: Orthopedic Surgery

## 2017-06-08 NOTE — Telephone Encounter (Signed)
Hydrocodone-Acetaminophen  7.5/325 mg  Qty 21 Tablets ° °One every six hours as needed for pain. Seven day limit. ° °PATIENT USES [facility] IN [place] °

## 2017-06-09 MED ORDER — HYDROCODONE-ACETAMINOPHEN 7.5-325 MG PO TABS: ORAL_TABLET | ORAL | 0 refills | Status: DC

## 2017-06-14 ENCOUNTER — Encounter (HOSPITAL_COMMUNITY): Payer: Self-pay | Admitting: Hematology

## 2017-06-14 ENCOUNTER — Other Ambulatory Visit: Payer: Self-pay

## 2017-06-14 ENCOUNTER — Encounter (HOSPITAL_COMMUNITY): Payer: Self-pay | Admitting: Adult Health

## 2017-06-14 ENCOUNTER — Inpatient Hospital Stay (HOSPITAL_COMMUNITY): Payer: BLUE CROSS/BLUE SHIELD | Attending: Hematology | Admitting: Hematology

## 2017-06-14 DIAGNOSIS — Z148 Genetic carrier of other disease: Secondary | ICD-10-CM | POA: Insufficient documentation

## 2017-06-14 NOTE — Progress Notes (Signed)
    Jun 14, 2017   To Whom It May Concern:    Mr. Stephen Fuller (DOB: 10-12-1955) was seen today at [facility] for a blood disorder called hemochromatosis.  He will require several visits to [facility] for therapeutic phlebotomy procedures, which will require him to miss a few hours of work for each procedure.  He will also periodically need to return to [facility] for labs and to see the doctor for follow-up visits.  Please excuse these absences from work for his healthcare needs.  You are welcome to contact us with any questions or concerns regarding [name].    Best regards,      Lubertha Basque, NP [facility] [phone]

## 2017-06-14 NOTE — Progress Notes (Signed)
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 male is here with referral from [facility] in [place], after patient was referred to [facility] location from his PCP, who is also in [place]. He saw Dr. Irene Limbo with hematology/oncology on 05/09/17; pt requested referral to [facility], as he lives in [place] and [place] is closer for him.     Denies any night sweats, fevers, or infections.  He endorses fatigue; this has been chronic and is largely stable.  Denies any recent hospitalizations or illnesses.  Denies abdominal pain, N&V, headache, or vision changes.   History of hepatitis C, which was treated with Harvoni and has been cured per patient report.   He is not clear if anyone in his family has ever been diagnosed with hemochromatosis.    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 Smokes ~1 ppd for about 37 years.  Drinks 1-2 alcoholic beverages per week.     MEDICAL HISTORY:  Past Medical History:  Diagnosis Date  . Arthritis   . Chest pain   . Hepatitis C virus infection cured after antiviral drug therapy   . Hypertension   . Palpitations     SURGICAL HISTORY: Past Surgical History:  Procedure Laterality Date  . CHOLECYSTECTOMY  2005  . COLONOSCOPY  2019  . LIVER BIOPSY  2019    SOCIAL HISTORY: Social History   Socioeconomic History  . Marital status: Married    Spouse name: Not on file  . Number of children: 2  . Years of education: Not on file  . Highest education level: Not on file  Occupational History  . Occupation: UNEMPLOYED  Social Needs  . Financial resource  strain: Not on file  . Food insecurity:    Worry: Not on file    Inability: Not on file  . Transportation needs:    Medical: Not on file    Non-medical: Not on file  Tobacco Use  . Smoking status: Current Every Day Smoker    Packs/day: 1.00    Years: 37.00    Pack years: 37.00    Types: Cigarettes  . Smokeless tobacco: Never Used  Substance and Sexual Activity  . Alcohol use: Yes    Alcohol/week: 1.2 oz    Types: 2 Standard drinks or equivalent per week    Comment: Occasional  . Drug use: No  . Sexual activity: Not on file  Lifestyle  . Physical activity:    Days per week: Not on file    Minutes per session: Not on file  . Stress: Not on file  Relationships  . Social connections:    Talks on phone: Not on file    Gets together: Not on file    Attends religious service: Not on file    Active member of club or organization: Not on file    Attends meetings of clubs or organizations: Not on file    Relationship status: Not on file  . Intimate partner violence:    Fear of current or ex partner: Not on file    Emotionally abused: Not on file    Physically abused: Not on file  Forced sexual activity: Not on file  Other Topics Concern  . Not on file  Social History Narrative  . Not on file    FAMILY HISTORY: Family History  Problem Relation Age of Onset  . Diabetes Mother   . Stroke Mother   . Hypertension Mother   . Heart disease Unknown        No family history  . Diabetes Brother   . Cirrhosis Brother   . Colon cancer Neg Hx     ALLERGIES:  is allergic to morphine and related.  MEDICATIONS:  Current Outpatient Medications  Medication Sig Dispense Refill  . aspirin EC 81 MG tablet Take 1 tablet (81 mg total) by mouth daily.    Marland Kitchen HYDROcodone-acetaminophen (NORCO) 7.5-325 MG tablet One every six hours as needed for pain.  Seven day limit 21 tablet 0  . metoprolol tartrate (LOPRESSOR) 50 MG tablet Take 1 tablet by mouth 2 (two) times daily.  4  . naproxen  (NAPROSYN) 500 MG tablet Take 1 tablet (500 mg total) by mouth 2 (two) times daily with a meal. 60 tablet 5   Current Facility-Administered Medications  Medication Dose Route Frequency Provider Last Rate Last Dose  . 0.9 %  sodium chloride infusion  500 mL Intravenous Continuous Danis, Estill Cotta III, MD        REVIEW OF SYSTEMS:   Constitutional: Denies fevers, chills or abnormal night sweats.  Mild fatigue which is stable present. Eyes: Denies blurriness of vision, double vision or watery eyes Ears, nose, mouth, throat, and face: Denies mucositis or sore throat Respiratory: Denies cough, dyspnea or wheezes Cardiovascular: Denies palpitation, chest discomfort or lower extremity swelling Gastrointestinal:  Denies nausea, heartburn or change in bowel habits Skin: Denies abnormal skin rashes Lymphatics: Denies new lymphadenopathy or easy bruising Neurological:Denies numbness, tingling or new weaknesses Behavioral/Psych: Mood is stable, no new changes  All other systems were reviewed with the patient and are negative.  PHYSICAL EXAMINATION: ECOG PERFORMANCE STATUS: 0 - Asymptomatic  Vitals:   06/14/17 1106  BP: (!) 181/74  Pulse: (!) 51  Resp: 16  Temp: 98.6 F (37 C)  SpO2: 99%   Filed Weights   06/14/17 1106  Weight: 209 lb 8 oz (95 kg)    GENERAL:alert, no distress and comfortable SKIN: skin color, texture, turgor are normal, no rashes or significant lesions EYES: normal, conjunctiva are pink and non-injected, sclera clear OROPHARYNX:no exudate, no erythema and lips, buccal mucosa, and tongue normal  NECK: supple, thyroid normal size, non-tender, without nodularity LYMPH:  no palpable lymphadenopathy in the cervical, axillary or inguinal LUNGS: clear to auscultation and percussion with normal breathing effort HEART: regular rate & rhythm and no murmurs and no lower extremity edema ABDOMEN:abdomen soft, non-tender and normal bowel sounds Musculoskeletal:no cyanosis of digits  and no clubbing  PSYCH: alert & oriented x 3 with fluent speech   LABORATORY DATA:  I have reviewed the data as listed Recent Results (from the past 2160 hour(s))  CMP ([place] only)     Status: None   Collection Time: 05/09/17 11:17 AM  Result Value Ref Range   Sodium 141 136 - 145 mmol/L   Potassium 4.3 3.5 - 5.1 mmol/L   Chloride 107 98 - 109 mmol/L   CO2 27 22 - 29 mmol/L   Glucose, Bld 109 70 - 140 mg/dL   BUN 8 7 - 26 mg/dL   Creatinine 0.97 0.70 - 1.30 mg/dL   Calcium 9.9 8.4 - 10.4 mg/dL  Total Protein 8.3 6.4 - 8.3 g/dL   Albumin 4.2 3.5 - 5.0 g/dL   AST 23 5 - 34 U/L   ALT 35 0 - 55 U/L   Alkaline Phosphatase 89 40 - 150 U/L   Total Bilirubin 0.4 0.2 - 1.2 mg/dL   GFR, Est Non Af Am >60 >60 mL/min   GFR, Est AFR Am >60 >60 mL/min    Comment: (NOTE) The eGFR has been calculated using the CKD EPI equation. This calculation has not been validated in all clinical situations. eGFR's persistently <60 mL/min signify possible Chronic Kidney Disease.    Anion gap 7 3 - 11    Comment: Performed at [facility], [address] [place], [place] [address]  Ferritin     Status: None   Collection Time: 05/09/17 11:17 AM  Result Value Ref Range   Ferritin 210 22 - 316 ng/mL    Comment: Performed at [facility], [place] [address], [place], [place] [address]  Iron and TIBC     Status: Abnormal   Collection Time: 05/09/17 11:17 AM  Result Value Ref Range   Iron 84 42 - 163 ug/dL   TIBC 334 202 - 409 ug/dL   Saturation Ratios 25 (L) 42 - 163 %   UIBC 250 ug/dL    Comment: Performed at [facility], [place] [address] [place], [place] [address]  CBC with Differential ([place] Only)     Status: Abnormal   Collection Time: 05/09/17 11:17 AM  Result Value Ref Range   WBC Count 6.2 4.0 - 10.3 K/uL   RBC 4.98 4.20 - 5.82 MIL/uL   Hemoglobin 15.3 13.0 - 17.1 g/dL   HCT 44.1 38.4 - 49.9 %   MCV  88.6 79.3 - 98.0 fL   MCH 30.7 27.2 - 33.4 pg   MCHC 34.7 32.0 - 36.0 g/dL   RDW 13.9 11.0 - 14.6 %   Platelet Count 165 140 - 400 K/uL   Neutrophils Relative % 33 %   Neutro Abs 2.1 1.5 - 6.5 K/uL   Lymphocytes Relative 56 %   Lymphs Abs 3.4 (H) 0.9 - 3.3 K/uL   Monocytes Relative 6 %   Monocytes Absolute 0.4 0.1 - 0.9 K/uL   Eosinophils Relative 4 %   Eosinophils Absolute 0.3 0.0 - 0.5 K/uL   Basophils Relative 1 %   Basophils Absolute 0.1 0.0 - 0.1 K/uL    Comment: Performed at [facility], [place] [address], [place], [place] [address]  Reticulocytes     Status: None   Collection Time: 05/09/17 11:17 AM  Result Value Ref Range   Retic Ct Pct 1.0 0.8 - 1.8 %   RBC. 4.98 4.20 - 5.82 MIL/uL   Retic Count, Absolute 49.8 34.8 - 93.9 K/uL    Comment: Performed at [facility], [place] [address] [place], [place] [address]    RADIOGRAPHIC STUDIES: I have reviewed his ultrasound liver on 03/09/2017 showed heterogeneous appearance.  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 -Liver biopsy on 03/09/2017 showed chronic active hepatitis with focal portal and periportal fibrosis and increased hepatocyte iron. - Last ferritin was 210 and TSAT was 25. - Found to have heterozygosity for C282Y mutation on testing.  No family history of hemochromatosis. - In  general iron overload for carriers is not as severe as for homozygous HH.  Part of his ferritin elevation could be from underlying liver disease.  I agree with Dr. Grier Mitts recommendation.  We will schedule him for phlebotomy once every 2 weeks x 2.  We will reevaluate him in 6 weeks with repeat CBC, ferritin and iron panel.  Goal is to keep the ferritin below 100.  We have encouraged him to drink plenty of fluids prior to each phlebotomy.   Questions were encouraged and answered to satisfaction.  2.  Chronic hepatitis C: -Received Harvoni treatment and is cured at this time.   All questions were answered. The patient knows to call the clinic with any problems, questions or concerns. Total time spent is 40 minutes with more than 50% of the time spent face-to-face discussing diagnosis, prognosis and management.  Orders placed this encounter:  Orders Placed This Encounter  Procedures  . Phlebotomy therapeutic     This note includes documentation from Mike Craze, NP, who was present during this patient's office visit and evaluation.  I have reviewed this note for its completeness and accuracy.  I have edited this note accordingly based on my findings and medical opinion.       Derek Jack, MD 06/14/17 12:36 PM

## 2017-06-14 NOTE — Assessment & Plan Note (Signed)
1.  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 -Liver biopsy on 03/09/2017 showed chronic active hepatitis with focal portal and periportal fibrosis and increased hepatocyte iron. - Last ferritin was 210 and TSAT was 25. - Found to have heterozygosity for C282Y mutation on testing.  No family history of hemochromatosis. - In general iron overload for carriers is not as severe as for homozygous HH.  Part of his ferritin elevation could be from underlying liver disease.  I agree with Dr. Clyda Greener recommendation.  We will schedule him for phlebotomy once every 2 weeks x 2.  We will reevaluate him in 6 weeks with repeat CBC, ferritin and iron panel.  Goal is to keep the ferritin below 100.  We have encouraged him to drink plenty of fluids prior to each phlebotomy.  Questions were encouraged and answered to satisfaction.  2.  Chronic hepatitis C: -Received Harvoni treatment and is cured at this time.

## 2017-06-15 ENCOUNTER — Other Ambulatory Visit: Payer: Self-pay | Admitting: Orthopaedic Surgery

## 2017-06-15 NOTE — Telephone Encounter (Addendum)
Was going to send request tomorrow since too soon, then I realized he is requesting today since he has to have request in by noon today (which he did)

## 2017-06-15 NOTE — Telephone Encounter (Signed)
Hydrocodone-Acetaminophen  7.5/325 mg  Qty 21 Tablets  One every six hours as needed for pain. Seven day limit.  PATIENT USES [facility]

## 2017-06-16 ENCOUNTER — Encounter (HOSPITAL_COMMUNITY)
Admission: RE | Admit: 2017-06-16 | Discharge: 2017-06-16 | Disposition: A | Discharge status: Home / Self Care | Payer: BLUE CROSS/BLUE SHIELD | Source: Ambulatory Visit | Attending: Hematology | Admitting: Hematology

## 2017-06-16 MED ORDER — HYDROCODONE-ACETAMINOPHEN 7.5-325 MG PO TABS: ORAL_TABLET | ORAL | 0 refills | Status: DC

## 2017-06-16 NOTE — Progress Notes (Signed)
Stephen Fuller presents today for phlebotomy per MD orders. HGB/HCT:15.3/44.1 Phlebotomy procedure started at 1402 and ended at 1409. 20 oz removed. Patient tolerated procedure well. IV needle removed intact. Drsg to site. Sprite given to drink. 1438-awake. Voices no c/o at this time. D/C to home in good condition.

## 2017-06-22 ENCOUNTER — Other Ambulatory Visit: Payer: Self-pay | Admitting: Orthopaedic Surgery

## 2017-06-22 MED ORDER — HYDROCODONE-ACETAMINOPHEN 7.5-325 MG PO TABS: ORAL_TABLET | ORAL | 0 refills | Status: DC

## 2017-06-22 NOTE — Telephone Encounter (Signed)
Patient of Dr. Sanjuan Dame requests refill on Hydrocodone/Acetaminophen 7.5-325  Mgs.   Qty  21  Sig: One every six hours as needed for pain. Seven day limit  Patient states he uses [employer]

## 2017-06-29 ENCOUNTER — Telehealth: Payer: Self-pay | Admitting: Orthopaedic Surgery

## 2017-06-29 MED ORDER — HYDROCODONE-ACETAMINOPHEN 7.5-325 MG PO TABS: ORAL_TABLET | ORAL | 0 refills | Status: DC

## 2017-06-29 NOTE — Telephone Encounter (Signed)
Patient called for refill of medication: HYDROcodone-acetaminophen (NORCO) 7.5-325 MG tablet 21 tablet   - [facility]   - aware of appointment scheduled 07/25/17; offered appointment for today if needed; states at work and wants to wait till scheduled.

## 2017-06-30 ENCOUNTER — Encounter (HOSPITAL_COMMUNITY)
Admission: RE | Admit: 2017-06-30 | Discharge: 2017-06-30 | Disposition: A | Discharge status: Home / Self Care | Payer: BLUE CROSS/BLUE SHIELD | Source: Ambulatory Visit | Attending: Hematology | Admitting: Hematology

## 2017-06-30 ENCOUNTER — Encounter (HOSPITAL_COMMUNITY): Payer: BLUE CROSS/BLUE SHIELD

## 2017-06-30 NOTE — Progress Notes (Signed)
Stephen Fuller presents today for phlebotomy per MD orders. HGB/HCT:15.3/44.1 Phlebotomy procedure started at 1508 and ended at 1517. 19.5 oz removed. Patient tolerated procedure well. IV needle removed intact. drsg to site. 1535-sprite given to drink. VSS. Voices no c/o at this time. D/C to home in good condition.

## 2017-07-06 ENCOUNTER — Telehealth: Payer: Self-pay | Admitting: Orthopaedic Surgery

## 2017-07-06 NOTE — Telephone Encounter (Signed)
No narcotics.  Not seen since February.  Advil or Aleve

## 2017-07-06 NOTE — Telephone Encounter (Signed)
Patient called for refill:  HYDROcodone-acetaminophen (NORCO) 7.5-325 MG tablet 18 tablet  - [facility]

## 2017-07-06 NOTE — Telephone Encounter (Signed)
Called back to patient to relay, per Dr Keeling's response. °

## 2017-07-12 ENCOUNTER — Encounter: Payer: Self-pay | Admitting: Orthopaedic Surgery

## 2017-07-24 ENCOUNTER — Other Ambulatory Visit (HOSPITAL_COMMUNITY): Payer: Self-pay | Admitting: *Deleted

## 2017-07-25 ENCOUNTER — Inpatient Hospital Stay (HOSPITAL_COMMUNITY): Payer: BLUE CROSS/BLUE SHIELD | Attending: Hematology

## 2017-07-25 ENCOUNTER — Ambulatory Visit: Payer: Self-pay | Admitting: Orthopaedic Surgery

## 2017-07-25 LAB — CBC WITH DIFFERENTIAL/PLATELET
Basophils Absolute: 0 10*3/uL (ref 0.0–0.1)
Basophils Relative: 1 %
EOS ABS: 0.2 10*3/uL (ref 0.0–0.7)
Eosinophils Relative: 2 %
HEMATOCRIT: 42.2 % (ref 39.0–52.0)
HEMOGLOBIN: 14.5 g/dL (ref 13.0–17.0)
Lymphocytes Relative: 41 %
Lymphs Abs: 3.3 10*3/uL (ref 0.7–4.0)
MCH: 31 pg (ref 26.0–34.0)
MCHC: 34.4 g/dL (ref 30.0–36.0)
MCV: 90.4 fL (ref 78.0–100.0)
MONOS PCT: 9 %
Monocytes Absolute: 0.8 10*3/uL (ref 0.1–1.0)
NEUTROS ABS: 3.9 10*3/uL (ref 1.7–7.7)
NEUTROS PCT: 47 %
Platelets: 189 10*3/uL (ref 150–400)
RBC: 4.67 MIL/uL (ref 4.22–5.81)
RDW: 13.5 % (ref 11.5–15.5)
WBC: 8.1 10*3/uL (ref 4.0–10.5)

## 2017-07-25 LAB — FERRITIN: Ferritin: 76 ng/mL (ref 24–336)

## 2017-07-25 LAB — IRON AND TIBC
IRON: 44 ug/dL — AB (ref 45–182)
Saturation Ratios: 14 % — ABNORMAL LOW (ref 17.9–39.5)
TIBC: 323 ug/dL (ref 250–450)
UIBC: 279 ug/dL

## 2017-07-31 ENCOUNTER — Ambulatory Visit (HOSPITAL_COMMUNITY): Payer: BLUE CROSS/BLUE SHIELD | Admitting: Internal Medicine

## 2017-08-13 ENCOUNTER — Other Ambulatory Visit: Payer: Self-pay

## 2017-08-13 ENCOUNTER — Emergency Department (HOSPITAL_COMMUNITY)
Admission: EM | Admit: 2017-08-13 | Discharge: 2017-08-13 | Disposition: A | Discharge status: Home / Self Care | Payer: Worker's Compensation | Attending: Emergency Medicine | Admitting: Emergency Medicine

## 2017-08-13 ENCOUNTER — Encounter (HOSPITAL_COMMUNITY): Payer: Self-pay | Admitting: Emergency Medicine

## 2017-08-13 ENCOUNTER — Emergency Department (HOSPITAL_COMMUNITY): Payer: Worker's Compensation

## 2017-08-13 DIAGNOSIS — I10 Essential (primary) hypertension: Secondary | ICD-10-CM | POA: Insufficient documentation

## 2017-08-13 DIAGNOSIS — Z7982 Long term (current) use of aspirin: Secondary | ICD-10-CM | POA: Diagnosis not present

## 2017-08-13 DIAGNOSIS — F17210 Nicotine dependence, cigarettes, uncomplicated: Secondary | ICD-10-CM | POA: Diagnosis not present

## 2017-08-13 DIAGNOSIS — M545 Low back pain, unspecified: Secondary | ICD-10-CM

## 2017-08-13 MED ORDER — PREDNISONE 10 MG (21) PO TBPK: ORAL_TABLET | Freq: Every day | ORAL | 0 refills | Status: DC

## 2017-08-13 MED ORDER — ACETAMINOPHEN 500 MG PO TABS: 500.0000 mg | ORAL_TABLET | Freq: Four times a day (QID) | ORAL | 0 refills | Status: DC | PRN

## 2017-08-13 NOTE — ED Triage Notes (Signed)
Patient c/o right lower back pain. Per patient hurt back yesterday while picking up wood for deck. Denies any complications with urination or BM. Patient reports taking naproxen with no relief, last dose at 7am.

## 2017-08-13 NOTE — ED Provider Notes (Signed)
[facility] EMERGENCY DEPARTMENT Provider Note   CSN: [id number] Arrival date & time: 08/13/17  1405     History   Chief Complaint Chief Complaint  Patient presents with  . Back Pain    HPI Stephen Fuller is a 62 y.o. male with history of arthritis, hypertension presents for evaluation of acute onset, intermittent right-sided low back pain since yesterday.  Patient states pain began at around 2:30 PM yesterday while he was at work attempting to lift a 250 pound load of steel with another coworker.   Pain is intermittent, "grabbing "pain to the right side of the back which occurs with position changes and ambulation.  He denies fevers, pain radiating down the leg, bowel or bladder incontinence, saddle anesthesia, or history of IV drug use.  He has tried naproxen and resting in bed without relief of his symptoms.  States he has been compliant with his blood pressure medications today, but his baseline typically runs around 170/90.  The history is provided by the patient.    Past Medical History:  Diagnosis Date  . Arthritis   . Chest pain   . Hepatitis C virus infection cured after antiviral drug therapy   . Hypertension   . Palpitations     Patient Active Problem List   Diagnosis Date Noted  . 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 SOB (shortness of breath) 01/28/2013  . Chest pain 02/16/2012  . Essential hypertension 02/16/2012  . Palpitations 02/16/2012  . Elevated LFTs 02/16/2012    Past Surgical History:  Procedure Laterality Date  . CHOLECYSTECTOMY  2005  . COLONOSCOPY  2019  . LIVER BIOPSY  2019        Home Medications    Prior to Admission medications   Medication Sig Start Date End Date Taking? Authorizing Provider  acetaminophen (TYLENOL) 500 MG tablet Take 1 tablet (500 mg total) by mouth every 6 (six) hours as needed. 08/13/17   Luevenia Maxin, King Pinzon A, PA-C  aspirin EC 81 MG tablet Take 1 tablet (81 mg total) by mouth daily. 02/16/12    Lewayne Bunting, MD  HYDROcodone-acetaminophen (NORCO) 7.5-325 MG tablet One every six hours as needed for pain.  Seven day limit 06/29/17   Darreld Mclean, MD  metoprolol tartrate (LOPRESSOR) 50 MG tablet Take 1 tablet by mouth 2 (two) times daily. 01/16/17   [provider]  naproxen (NAPROSYN) 500 MG tablet Take 1 tablet (500 mg total) by mouth 2 (two) times daily with a meal. 02/21/17   Darreld Mclean, MD  predniSONE (STERAPRED UNI-PAK 21 TAB) 10 MG (21) TBPK tablet Take by mouth daily. Take 6 tabs by mouth daily  for 2 days, then 5 tabs for 2 days, then 4 tabs for 2 days, then 3 tabs for 2 days, 2 tabs for 2 days, then 1 tab by mouth daily for 2 days 08/13/17   Jeanie Sewer, PA-C    Family History Family History  Problem Relation Age of Onset  . Diabetes Mother   . Stroke Mother   . Hypertension Mother   . Heart disease Unknown        No family history  . Diabetes Brother   . Cirrhosis Brother   . Colon cancer Neg Hx     Social History Social History   Tobacco Use  . Smoking status: Current Every Day Smoker    Packs/day: 1.00    Years: 37.00    Pack years: 37.00    Types:  Cigarettes  . Smokeless tobacco: Never Used  Substance Use Topics  . Alcohol use: Yes    Alcohol/week: 1.2 oz    Types: 2 Standard drinks or equivalent per week    Comment: Occasional  . Drug use: No     Allergies   Morphine and related   Review of Systems Review of Systems  Constitutional: Negative for chills and fever.  Musculoskeletal: Positive for back pain.  Neurological: Negative for weakness and numbness.     Physical Exam Updated Vital Signs BP (!) 197/77 (BP Location: Left Arm)   Pulse 60   Temp 97.9 F (36.6 C) (Temporal)   Resp 14   Ht 6' (1.829 m)   Wt 97.5 kg (215 lb)   SpO2 99%   BMI 29.16 kg/m   Physical Exam  Constitutional: He appears well-developed and well-nourished. No distress.  HENT:  Head: Normocephalic and atraumatic.  Eyes: Conjunctivae are  normal. Right eye exhibits no discharge. Left eye exhibits no discharge.  Neck: Normal range of motion. Neck supple. No JVD present. No tracheal deviation present.  Cardiovascular: Normal rate, regular rhythm, normal heart sounds and intact distal pulses.  2+ DP/PT pulses bilaterally, no lower extremity edema, Homans sign absent bilaterally  Pulmonary/Chest: Effort normal and breath sounds normal.  Abdominal: Soft. Bowel sounds are normal. He exhibits no distension. There is no tenderness. There is no guarding.  No pulsatile masses  Musculoskeletal: He exhibits tenderness. He exhibits no edema.  No midline spine tenderness, right paralumbar muscle tenderness and spasm noted.  No deformity, crepitus, or step-off noted.  5/5 strength of BLE major muscle groups.  Negative straight leg raise bilaterally.  Limited range of motion of the lumbar spine with flexion secondary to pain.  No pain elicited with extension or lateral rotation.  Neurological: He is alert. No cranial nerve deficit or sensory deficit. He exhibits normal muscle tone.  Fluent speech with no evidence of dysarthria or aphasia, no facial droop, sensation intact to soft touch of bilateral lower extremities.  Ambulates with a mildly antalgic gait, but able to Heel Walk and Toe Walk without difficulty.  Skin: Skin is warm and dry. No erythema.  Psychiatric: He has a normal mood and affect. His behavior is normal.  Nursing note and vitals reviewed.    ED Treatments / Results  Labs (all labs ordered are listed, but only abnormal results are displayed) Labs Reviewed - No data to display  EKG None  Radiology Dg Lumbar Spine Complete  Result Date: 08/13/2017 CLINICAL DATA:  Pain after lifting something heavy yesterday. EXAM: LUMBAR SPINE - COMPLETE 4+ VIEW COMPARISON:  None. FINDINGS: No fracture or traumatic malalignment. Multilevel degenerative disc disease with anterior osteophytes. No other acute abnormalities. IMPRESSION:  Degenerative disc disease.  No fractures identified. Electronically Signed   By: Gerome Samavid  Williams III M.D   On: 08/13/2017 17:34    Procedures Procedures (including critical care time)  Medications Ordered in ED Medications - No data to display   Initial Impression / Assessment and Plan / ED Course  I have reviewed the triage vital signs and the nursing notes.  Pertinent labs & imaging results that were available during my care of the patient were reviewed by me and considered in my medical decision making (see chart for details).     Patient presents for evaluation of right-sided low back pain after heavy lifting at work yesterday.  He is afebrile, hypertensive while in the ED but does appear to be in a  fair amount of pain.  Baseline blood pressure is usually around 170/90.  Pain does appear to be musculoskeletal in etiology, reproducible on palpation and with range of motion.  Low concern for dissection.  No red flag signs concerning for cauda equina or spinal abscess.  He is ambulatory without difficulty despite pain. RICE therapy indicated and discussed.  Will discharge with Tylenol, prednisone taper, he has Flexeril at home.  Advised of appropriate use of these medications.  Advised that he should avoid NSAIDs secondary to his hypertension.  Recommend follow-up with primary care physician or orthopedist for reevaluation of symptoms.  Discussed strict ED return precautions. Pt verbalized understanding of and agreement with plan and is safe for discharge home at this time.   Final Clinical Impressions(s) / ED Diagnoses   Final diagnoses:  Acute right-sided low back pain without sciatica    ED Discharge Orders        Ordered    predniSONE (STERAPRED UNI-PAK 21 TAB) 10 MG (21) TBPK tablet  Daily     08/13/17 1747    acetaminophen (TYLENOL) 500 MG tablet  Every 6 hours PRN     08/13/17 1747       Jeanie Sewer, PA-C 08/13/17 Elberta Spaniel, MD 08/13/17 [phone]

## 2017-08-13 NOTE — Discharge Instructions (Addendum)
1. Medications: Take steroid pack as prescribed.  Do not take ibuprofen, Naprosyn, Aleve, or Motrin while taking this medication.  You may also take [phone] mg of Tylenol every 6 hours as needed for pain in addition to the steroid pack. Do not exceed 4000 mg of Tylenol daily.  Take steroid pack with food to avoid upset stomach issues.  You can take Flexeril as needed for muscle spasm up to twice daily but do not drive, drink alcohol, or operate heavy machinery while taking this medicine because it may make you drowsy.  I typically recommend taking this medicine only at night when you are going to sleep.  You can also cut these tablets in half if they make you feel very drowsy. 2. Treatment: rest, drink plenty of fluids, gentle stretching as discussed (see attached), alternate ice and heat (or stick with whichever feels best) 20 minutes on 20 minutes off. 3. Follow Up: Please followup with your primary doctor or orthopedist in 3-7 days for discussion of your diagnoses and further evaluation after today's visit; if you do not have a primary care doctor use the resource guide provided to find one;  Return to the ER for worsening back pain, difficulty walking, loss of bowel or bladder control or other concerning symptoms  If your blood pressure (BP) was elevated on multiple readings during this visit above 130 for the top number or above 80 for the bottom number, please have this repeated by your primary care provider within one month. You can also check your blood pressure when you are out at a pharmacy or grocery store. Many have machines that will check your blood pressure.  If your blood pressure remains elevated, please follow-up with your PCP.

## 2017-08-15 ENCOUNTER — Telehealth: Payer: Self-pay | Admitting: Orthopaedic Surgery

## 2017-08-15 NOTE — Telephone Encounter (Signed)
Patient called to inquire about scheduling appointment with Dr Hilda LiasKeeling for a work-related injury- states hurt his back with doing heavy lifting 08/12/17; states reported to his employer and then [facility]; Xrays were done. Discussed workers comp protocol; patient understands process, and will contact the workers Designer, industrial/productcomp adjuster and employer for them to contact our office regarding approval for appointment.

## 2017-08-16 NOTE — Telephone Encounter (Signed)
As of 08/16/17, no further response. Patient aware - if approval received, can schedule.

## 2017-08-21 ENCOUNTER — Encounter (HOSPITAL_COMMUNITY): Payer: Self-pay | Admitting: Internal Medicine

## 2017-08-21 ENCOUNTER — Inpatient Hospital Stay (HOSPITAL_COMMUNITY): Payer: BLUE CROSS/BLUE SHIELD | Attending: Hematology | Admitting: Internal Medicine

## 2017-08-21 ENCOUNTER — Other Ambulatory Visit: Payer: Self-pay

## 2017-08-21 DIAGNOSIS — I10 Essential (primary) hypertension: Secondary | ICD-10-CM | POA: Diagnosis not present

## 2017-08-21 DIAGNOSIS — B182 Chronic viral hepatitis C: Secondary | ICD-10-CM | POA: Diagnosis not present

## 2017-08-21 NOTE — Patient Instructions (Signed)
[facility] at [facility] Discharge Instructions  Seen by Dr. Higgs today Follow up as scheduled.  Thank you for choosing [facility] at [facility] to provide your oncology and hematology care.  To afford each patient quality time with our provider, please arrive at least 15 minutes before your scheduled appointment time.   If you have a lab appointment with the Cancer Center please come in thru the  Main Entrance and check in at the main information desk  You need to re-schedule your appointment should you arrive 10 or more minutes late.  We strive to give you quality time with our providers, and arriving late affects you and other patients whose appointments are after yours.  Also, if you no show three or more times for appointments you may be dismissed from the clinic at the providers discretion.     Again, thank you for choosing [facility].  Our hope is that these requests will decrease the amount of time that you wait before being seen by our physicians.       _____________________________________________________________  Should you have questions after your visit to [facility], please contact our office at [phone] between the hours of 8:30 a.m. and 4:30 p.m.  Voicemails left after 4:30 p.m. will not be returned until the following business day.  For prescription refill requests, have your pharmacy contact our office.       Resources For Cancer Patients and their Caregivers ? American Cancer Society: Can assist with transportation, wigs, general needs, runs Look Good Feel Better.        1-888-227-6333 ? Cancer Care: Provides financial assistance, online support groups, medication/co-pay assistance.  1-800-813-HOPE (4673) ? [facility] Assists [place] cancer patients and their families through emotional , educational and financial support.  336-427-4357 ? [place]  DSS Where to apply for food stamps, Medicaid and utility assistance. 336-342-1394 ? [facility]: Transportation to medical appointments. 336-347-2287 ? Social Security Administration: May apply for disability if have a Stage IV cancer. 336-342-7796 1-800-772-1213 ? [place] Aging, Disability and Transit Services: Assists with nutrition, care and transit needs. 336-349-2343  Cancer Center Support Programs:   > Cancer Support Group  2nd Tuesday of the month 1pm-2pm, Journey Room   > Creative Journey  3rd Tuesday of the month 1130am-1pm, Journey Room    

## 2017-08-21 NOTE — Progress Notes (Signed)
Diagnosis Hereditary hemochromatosis (HCC) - Plan: CBC with Differential/Platelet, Comprehensive metabolic panel, Lactate dehydrogenase, Ferritin, Iron and TIBC, Transferrin Saturation  Staging Cancer Staging No matching staging information was found for the patient.  Assessment and Plan:  1.  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  Pt has a history of hepatitis C, underwent treatment with Harvoni in 2018.  He had Liver biopsy on 03/09/2017 showed chronic active hepatitis with focal portal and periportal fibrosis and increased hepatocyte iron.  - Found to have heterozygosity for C282Y mutation on testing.  No family history of hemochromatosis.  Pt has undergone phlebotomy x 2 last performed in 06/2017.  Ferritin has improved from 210 in 04/2017 to 76 on labs done 07/2017.  He will be set up for repeat labs in 10/2017 and will follow-up to go over results.    He had previous discussions regarding  iron overload for carriers is not as severe as for homozygous HH.  Part of his ferritin elevation could be from underlying liver disease.  Pending iron studies in 2 months he may be recommended for additional phlebotomy.    2.  Chronic hepatitis C.  Pt was treated with Harvoni.  LFTs on recent labs WNL.    3.  HTN.  BP is 171/63.  Pt advised to follow-up with PCP.      Current Status:  Pt is seen today for follow-up.  He is here to go over labs.  He reports last phlebotomy was in 06/2017.     Problem List Patient Active Problem List   Diagnosis Date Noted  . 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 SOB (shortness of breath) [R06.02] 01/28/2013  . Chest pain [R07.9] 02/16/2012  . Essential hypertension [I10] 02/16/2012  . Palpitations [R00.2] 02/16/2012  . Elevated LFTs [R94.5] 02/16/2012    Past Medical History Past Medical History:  Diagnosis Date  . Arthritis   . Chest pain   . Hepatitis C virus infection cured after antiviral drug therapy    . Hypertension   . Palpitations     Past Surgical History Past Surgical History:  Procedure Laterality Date  . CHOLECYSTECTOMY  2005  . COLONOSCOPY  2019  . LIVER BIOPSY  2019    Family History Family History  Problem Relation Age of Onset  . Diabetes Mother   . Stroke Mother   . Hypertension Mother   . Heart disease Unknown        No family history  . Diabetes Brother   . Cirrhosis Brother   . Colon cancer Neg Hx      Social History  reports that he has been smoking cigarettes.  He has a 37.00 pack-year smoking history. He has never used smokeless tobacco. He reports that he drinks about 1.2 oz of alcohol per week. He reports that he does not use drugs.  Medications  Current Outpatient Medications:  .  acetaminophen (TYLENOL) 500 MG tablet, Take 1 tablet (500 mg total) by mouth every 6 (six) hours as needed., Disp: 30 tablet, Rfl: 0 .  aspirin EC 81 MG tablet, Take 1 tablet (81 mg total) by mouth daily., Disp: , Rfl:  .  metoprolol tartrate (LOPRESSOR) 50 MG tablet, Take 1 tablet by mouth 2 (two) times daily., Disp: , Rfl: 4 .  HYDROcodone-acetaminophen (NORCO) 7.5-325 MG tablet, One every six hours as needed for pain.  Seven day limit (Patient not taking: Reported on 08/21/2017), Disp: 18 tablet, Rfl: 0 .  naproxen (NAPROSYN) 500 MG tablet,  Take 1 tablet (500 mg total) by mouth 2 (two) times daily with a meal. (Patient not taking: Reported on 08/21/2017), Disp: 60 tablet, Rfl: 5  Current Facility-Administered Medications:  .  0.9 %  sodium chloride infusion, 500 mL, Intravenous, Continuous, Danis, Starr Lake III, MD  Allergies Morphine and related  Review of Systems Review of Systems - Oncology ROS negative   Physical Exam  Vitals Wt Readings from Last 3 Encounters:  08/21/17 210 lb (95.3 kg)  08/13/17 215 lb (97.5 kg)  06/14/17 209 lb 8 oz (95 kg)   Temp Readings from Last 3 Encounters:  08/21/17 98.4 F (36.9 C) (Oral)  08/13/17 97.9 F (36.6 C) (Temporal)   06/14/17 98.6 F (37 C) (Oral)   BP Readings from Last 3 Encounters:  08/21/17 (!) 171/63  08/13/17 (!) 197/77  06/30/17 134/79   Pulse Readings from Last 3 Encounters:  08/21/17 65  08/13/17 60  06/30/17 61   Constitutional: Well-developed, well-nourished, and in no distress.   HENT: Head: Normocephalic and atraumatic.  Mouth/Throat: No oropharyngeal exudate. Mucosa moist. Eyes: Pupils are equal, round, and reactive to light. Conjunctivae are normal. No scleral icterus.  Neck: Normal range of motion. Neck supple. No JVD present.  Cardiovascular: Normal rate, regular rhythm and normal heart sounds.  Exam reveals no gallop and no friction rub.   No murmur heard. Pulmonary/Chest: Effort normal and breath sounds normal. No respiratory distress. No wheezes.No rales.  Abdominal: Soft. Bowel sounds are normal. No distension. There is no tenderness. There is no guarding.  Musculoskeletal: No edema or tenderness.  Lymphadenopathy: No cervical, axillary or supraclavicular adenopathy.  Neurological: Alert and oriented to person, place, and time. No cranial nerve deficit.  Skin: Skin is warm and dry. No rash noted. No erythema. No pallor.  Psychiatric: Affect and judgment normal.   Labs No visits with results within 3 Day(s) from this visit.  Latest known visit with results is:  Appointment on 07/25/2017  Component Date Value Ref Range Status  . WBC 07/25/2017 8.1  4.0 - 10.5 K/uL Final  . RBC 07/25/2017 4.67  4.22 - 5.81 MIL/uL Final  . Hemoglobin 07/25/2017 14.5  13.0 - 17.0 g/dL Final  . HCT 16/11/9602 42.2  39.0 - 52.0 % Final  . MCV 07/25/2017 90.4  78.0 - 100.0 fL Final  . MCH 07/25/2017 31.0  26.0 - 34.0 pg Final  . MCHC 07/25/2017 34.4  30.0 - 36.0 g/dL Final  . RDW 54/10/8117 13.5  11.5 - 15.5 % Final  . Platelets 07/25/2017 189  150 - 400 K/uL Final  . Neutrophils Relative % 07/25/2017 47  % Final  . Neutro Abs 07/25/2017 3.9  1.7 - 7.7 K/uL Final  . Lymphocytes Relative  07/25/2017 41  % Final  . Lymphs Abs 07/25/2017 3.3  0.7 - 4.0 K/uL Final  . Monocytes Relative 07/25/2017 9  % Final  . Monocytes Absolute 07/25/2017 0.8  0.1 - 1.0 K/uL Final  . Eosinophils Relative 07/25/2017 2  % Final  . Eosinophils Absolute 07/25/2017 0.2  0.0 - 0.7 K/uL Final  . Basophils Relative 07/25/2017 1  % Final  . Basophils Absolute 07/25/2017 0.0  0.0 - 0.1 K/uL Final   Performed at [facility], [address] [place], [place] [address]  . Iron 07/25/2017 44* 45 - 182 ug/dL Final  . TIBC 95/62/1308 323  250 - 450 ug/dL Final  . Saturation Ratios 07/25/2017 14* 17.9 - 39.5 % Final  . UIBC 07/25/2017 279  ug/dL Final   Performed at [facility], [address] [place], [place] [address]  . Ferritin 07/25/2017 76  24 - 336 ng/mL Final   Performed at [facility], [address] [place], [place] [address]     Pathology Orders Placed This Encounter  Procedures  . CBC with Differential/Platelet    Standing Status:   Future    Standing Expiration Date:   08/22/2018  . Comprehensive metabolic panel    Standing Status:   Future    Standing Expiration Date:   08/22/2018  . Lactate dehydrogenase    Standing Status:   Future    Standing Expiration Date:   08/22/2018  . Ferritin    Standing Status:   Future    Standing Expiration Date:   08/22/2018  . Iron and TIBC    Standing Status:   Future    Standing Expiration Date:   08/22/2018  . Transferrin Saturation    Standing Status:   Future    Standing Expiration Date:   08/22/2018       Ahmed PrimaVetta Lashaundra Lehrmann MD

## 2017-09-05 ENCOUNTER — Ambulatory Visit (INDEPENDENT_AMBULATORY_CARE_PROVIDER_SITE_OTHER): Payer: Worker's Compensation | Admitting: Orthopaedic Surgery

## 2017-09-05 ENCOUNTER — Encounter (INDEPENDENT_AMBULATORY_CARE_PROVIDER_SITE_OTHER): Payer: Self-pay | Admitting: Orthopaedic Surgery

## 2017-09-05 VITALS — BP 192/82 | HR 70 | Ht 72.0 in | Wt 210.0 lb

## 2017-09-05 DIAGNOSIS — M545 Low back pain, unspecified: Secondary | ICD-10-CM

## 2017-09-05 NOTE — Progress Notes (Signed)
Office Visit Note   Patient: Stephen Fuller           Date of Birth: 1955/04/06           MRN: [id number] Visit Date: 09/05/2017              Requested by: Fleet Contras, MD [address] [place], [place] [address] PCP: Fleet Contras, MD   Assessment & Plan: Visit Diagnoses:  1. Acute bilateral low back pain without sciatica     Plan: Patient can continue regular work.  I plan to recheck him in 6 weeks.  He is neurologically intact and has some mild degenerative changes on plain radiograph.  Recheck 6 weeks.  Follow-Up Instructions: Return in about 6 weeks (around 10/17/2017).   Orders:  No orders of the defined types were placed in this encounter.  No orders of the defined types were placed in this encounter.     Procedures: No procedures performed   Clinical Data: No additional findings.   Subjective: Chief Complaint  Patient presents with  . 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  It radiates across the lumbosacral junction and he said some increased symptoms after he walks on her feet.  Has some pain in his buttocks he has to stop rest and then can go again.  At times he is able to walk a little bit further without pain.  Patient originally went to the emergency room had x-rays obtained which were available for review.  He denies fever chills no bowel bladder symptoms.  No past history of back injury.  He is used Tylenol without significant relief.  He did get a prednisone pack in the emergency room did not think it really helped much.  He is continuing to work.  Date of injury was 08/12/2017.  Review of Systems 14 point review of systems positive for hypertension history of palpitations, elevated liver function test.  History of hemochromatosis.  Previous treatment for hepatitis C.  Negative for current risk factors  for hepatitis C.  Otherwise negative as it pertains HPI.   Objective: Vital Signs: BP (!) 192/82   Pulse 70   Ht 6' (1.829 m)   Wt 210 lb (95.3 kg)   BMI 28.48 kg/m   Physical Exam  Constitutional: He is oriented to person, place, and time. He appears well-developed and well-nourished.  HENT:  Head: Normocephalic and atraumatic.  Eyes: Pupils are equal, round, and reactive to light. EOM are normal.  Neck: No tracheal deviation present. No thyromegaly present.  Cardiovascular: Normal rate.  Pulmonary/Chest: Effort normal. He has no wheezes.  Abdominal: Soft. Bowel sounds are normal.  Neurological: He is alert and oriented to person, place, and time.  Skin: Skin is warm and dry. Capillary refill takes less than 2 seconds.  Psychiatric: He has a normal mood and affect. His behavior is normal. Judgment and thought content normal.    Ortho Exam patient can get from sitting standing position.  Negative logroll to the hips.  Negative straight leg raising to 90 degrees.  Anterior tib gastrocsoleus is strong no rash over exposed skin.  EHL was normal with him at resisted testing.  Mild sciatic tenderness.  Mild tenderness the lumbosacral junction.  Specialty Comments:  No specialty comments available.  Imaging: CLINICAL DATA:  Pain after lifting something heavy yesterday.  EXAM: LUMBAR SPINE - COMPLETE 4+ VIEW  COMPARISON:  None.  FINDINGS: No fracture or traumatic malalignment. Multilevel degenerative disc disease with anterior osteophytes. No other acute abnormalities.  IMPRESSION: Degenerative disc disease.  No fractures identified.   Electronically Signed   By: Gerome Samavid  Williams III M.D   On: 08/13/2017 17:34   PMFS History: Patient Active Problem List   Diagnosis Date Noted  . 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 SOB (shortness of breath) 01/28/2013  . Chest pain 02/16/2012  . Essential hypertension 02/16/2012  . Palpitations 02/16/2012    . Elevated LFTs 02/16/2012   Past Medical History:  Diagnosis Date  . Arthritis   . Chest pain   . Hepatitis C virus infection cured after antiviral drug therapy   . Hypertension   . Palpitations     Family History  Problem Relation Age of Onset  . Diabetes Mother   . Stroke Mother   . Hypertension Mother   . Heart disease Unknown        No family history  . Diabetes Brother   . Cirrhosis Brother   . Colon cancer Neg Hx     Past Surgical History:  Procedure Laterality Date  . CHOLECYSTECTOMY  2005  . COLONOSCOPY  2019  . LIVER BIOPSY  2019   Social History   Occupational History  . Occupation: UNEMPLOYED  Tobacco Use  . Smoking status: Current Every Day Smoker    Packs/day: 1.00    Years: 37.00    Pack years: 37.00    Types: Cigarettes  . Smokeless tobacco: Never Used  Substance and Sexual Activity  . Alcohol use: Yes    Alcohol/week: 1.2 oz    Types: 2 Standard drinks or equivalent per week    Comment: Occasional  . Drug use: No  . Sexual activity: Not on file

## 2017-09-11 ENCOUNTER — Telehealth (INDEPENDENT_AMBULATORY_CARE_PROVIDER_SITE_OTHER): Payer: Self-pay

## 2017-09-11 NOTE — Telephone Encounter (Signed)
-----   Message from Eldred MangesMark C Yates, MD sent at 09/05/2017  5:15 PM EDT ----- W/c thx

## 2017-09-11 NOTE — Telephone Encounter (Signed)
Faxed note to [facility] adj Mollie GermanyCathy Tobias [phone]

## 2017-10-17 ENCOUNTER — Ambulatory Visit (INDEPENDENT_AMBULATORY_CARE_PROVIDER_SITE_OTHER): Payer: Self-pay | Admitting: Orthopaedic Surgery

## 2017-10-20 ENCOUNTER — Inpatient Hospital Stay (HOSPITAL_COMMUNITY): Payer: BLUE CROSS/BLUE SHIELD | Attending: Hematology

## 2017-10-20 LAB — COMPREHENSIVE METABOLIC PANEL
ALBUMIN: 4.2 g/dL (ref 3.5–5.0)
ALT: 27 U/L (ref 0–44)
AST: 23 U/L (ref 15–41)
Alkaline Phosphatase: 59 U/L (ref 38–126)
Anion gap: 7 (ref 5–15)
BILIRUBIN TOTAL: 0.7 mg/dL (ref 0.3–1.2)
BUN: 14 mg/dL (ref 8–23)
CO2: 25 mmol/L (ref 22–32)
Calcium: 9.2 mg/dL (ref 8.9–10.3)
Chloride: 109 mmol/L (ref 98–111)
Creatinine, Ser: 0.98 mg/dL (ref 0.61–1.24)
GFR calc Af Amer: >60 mL/min (ref >60)
GFR calc non Af Amer: >60 mL/min (ref >60)
Glucose, Bld: 120 mg/dL — ABNORMAL HIGH (ref 70–99)
POTASSIUM: 3.4 mmol/L — AB (ref 3.5–5.1)
Sodium: 141 mmol/L (ref 135–145)
TOTAL PROTEIN: 7.3 g/dL (ref 6.5–8.1)

## 2017-10-20 LAB — CBC WITH DIFFERENTIAL/PLATELET
BASOS ABS: 0.1 10*3/uL (ref 0.0–0.1)
BASOS PCT: 1 %
Eosinophils Absolute: 0.3 10*3/uL (ref 0.0–0.7)
Eosinophils Relative: 3 %
HEMATOCRIT: 40.4 % (ref 39.0–52.0)
HEMOGLOBIN: 14.1 g/dL (ref 13.0–17.0)
LYMPHS PCT: 49 %
Lymphs Abs: 4.4 10*3/uL — ABNORMAL HIGH (ref 0.7–4.0)
MCH: 30.5 pg (ref 26.0–34.0)
MCHC: 34.9 g/dL (ref 30.0–36.0)
MCV: 87.4 fL (ref 78.0–100.0)
Monocytes Absolute: 0.6 10*3/uL (ref 0.1–1.0)
Monocytes Relative: 6 %
NEUTROS ABS: 3.7 10*3/uL (ref 1.7–7.7)
Neutrophils Relative %: 41 %
Platelets: 189 10*3/uL (ref 150–400)
RBC: 4.62 MIL/uL (ref 4.22–5.81)
RDW: 13.3 % (ref 11.5–15.5)
WBC: 8.9 10*3/uL (ref 4.0–10.5)

## 2017-10-20 LAB — LACTATE DEHYDROGENASE: LDH: 142 U/L (ref 98–192)

## 2017-10-20 LAB — IRON AND TIBC
Iron: 76 ug/dL (ref 45–182)
Saturation Ratios: 21 % (ref 17.9–39.5)
TIBC: 354 ug/dL (ref 250–450)
UIBC: 278 ug/dL

## 2017-10-20 LAB — FERRITIN: Ferritin: 117 ng/mL (ref 24–336)

## 2017-10-27 ENCOUNTER — Encounter (HOSPITAL_COMMUNITY): Payer: Self-pay | Admitting: Internal Medicine

## 2017-10-27 ENCOUNTER — Inpatient Hospital Stay (HOSPITAL_COMMUNITY): Payer: BLUE CROSS/BLUE SHIELD | Attending: Internal Medicine | Admitting: Internal Medicine

## 2017-10-27 DIAGNOSIS — I10 Essential (primary) hypertension: Secondary | ICD-10-CM | POA: Diagnosis not present

## 2017-10-27 NOTE — Patient Instructions (Signed)
[facility] at [facility]  Discharge Instructions:  You were seen by dr. higgs today.  _______________________________________________________________  Thank you for choosing [facility] at [facility] to provide your oncology and hematology care.  To afford each patient quality time with our providers, please arrive at least 15 minutes before your scheduled appointment.  You need to re-schedule your appointment if you arrive 10 or more minutes late.  We strive to give you quality time with our providers, and arriving late affects you and other patients whose appointments are after yours.  Also, if you no show three or more times for appointments you may be dismissed from the clinic.  Again, thank you for choosing [facility] at [facility]. Our hope is that these requests will allow you access to exceptional care and in a timely manner. _______________________________________________________________  If you have questions after your visit, please contact our office at [phone] between the hours of 8:30 a.m. and 5:00 p.m. Voicemails left after 4:30 p.m. will not be returned until the following business day. _______________________________________________________________  For prescription refill requests, have your pharmacy contact our office. _______________________________________________________________  Recommendations made by the consultant and any test results will be sent to your referring physician. _______________________________________________________________ 

## 2017-10-27 NOTE — Progress Notes (Signed)
Diagnosis Hereditary hemochromatosis ([place]) - Plan: Phlebotomy therapeutic, CBC with Differential/Platelet, Comprehensive metabolic panel, Lactate dehydrogenase, Ferritin, CBC with Differential/Platelet, Comprehensive metabolic panel, Lactate dehydrogenase, Ferritin  Staging Cancer Staging No matching staging information was found for the patient.  Assessment and Plan:  1.  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  Pt has a history of hepatitis C, underwent treatment with Harvoni in 2018.  He had Liver biopsy on 03/09/2017 showed chronic active hepatitis with focal portal and periportal fibrosis and increased hepatocyte iron.  - Found to have heterozygosity for C282Y mutation on testing.  No family history of hemochromatosis.  Pt has undergone phlebotomy x 2 last performed in 06/2017.  Ferritin has improved from 210 in 04/2017 to 76 on labs done 07/2017.    Labs done 10/20/2017 reviewed and showed WBC 8.9, HB 14 plts 189,000.  Ferritin is 117.  LFTS WNL.  Pt is set up for 1 unit ( 500 ML) phlebotomy.  Goal to keep ferritin less than 100.  He will have repeat labs in 01/2018 for ongoing monitoring.  Pt will be seen for follow-up in 04/2018.   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  Part of his ferritin elevation could be from underlying liver disease.   2.  Chronic hepatitis C.  Pt was treated with Harvoni.  LFTs WNL on labs done 10/20/2017.  Pt should follow-up with GI as recommended.     3.  HTN.  BP is 173/73.  Follow-up with PCP.    4.  Smoking. Cessation recommended.  Pt given option of lung cancer screening program.  He does not desire referral.    Current Status:  Pt is seen today for follow-up.  He is here to go over labs.  Pt reports he tolerated last phlebotomy without problems.    Problem List Patient Active Problem List   Diagnosis Date Noted  . 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 SOB (shortness of breath) [R06.02] 01/28/2013  . Chest pain [R07.9] 02/16/2012  . Essential hypertension [I10] 02/16/2012  . Palpitations [R00.2] 02/16/2012  . Elevated LFTs [R94.5] 02/16/2012    Past Medical History Past Medical History:  Diagnosis Date  . Arthritis   . Chest pain   . Hepatitis C virus infection cured after antiviral drug therapy   . Hypertension   . Palpitations     Past Surgical History Past Surgical History:  Procedure Laterality Date  . CHOLECYSTECTOMY  2005  . COLONOSCOPY  2019  . LIVER BIOPSY  2019    Family History Family History  Problem Relation Age of Onset  . Diabetes Mother   . Stroke Mother   . Hypertension Mother   . Heart disease Unknown        No family history  . Diabetes Brother   . Cirrhosis Brother   . Colon cancer Neg Hx      Social History  reports that he has been smoking cigarettes. He has a 37.00 pack-year smoking history. He has never used smokeless tobacco. He reports that he drinks about 2.0 standard drinks of alcohol per week. He reports that he does not use drugs.  Medications  Current Outpatient Medications:  .  acetaminophen (TYLENOL) 500 MG tablet, Take 1 tablet (500 mg total) by mouth every 6 (six) hours as needed., Disp: 30 tablet, Rfl: 0 .  aspirin EC 81 MG tablet, Take 1 tablet (81 mg total) by mouth daily.,  Disp: , Rfl:  .  metoprolol tartrate (LOPRESSOR) 50 MG tablet, Take 1 tablet by mouth 2 (two) times daily., Disp: , Rfl: 4 .  HYDROcodone-acetaminophen (NORCO) 7.5-325 MG tablet, One every six hours as needed for pain.  Seven day limit (Patient not taking: Reported on 10/27/2017), Disp: 18 tablet, Rfl: 0 .  naproxen (NAPROSYN) 500 MG tablet, Take 1 tablet (500 mg total) by mouth 2 (two) times daily with a meal. (Patient not taking: Reported on 10/27/2017), Disp: 60 tablet, Rfl: 5  Current Facility-Administered Medications:  .  0.9 %  sodium chloride infusion, 500 mL, Intravenous, Continuous,  Danis, Estill Cotta III, MD  Allergies Morphine and related  Review of Systems Review of Systems - Oncology ROS negative   Physical Exam  Vitals Wt Readings from Last 3 Encounters:  10/27/17 205 lb 14.4 oz (93.4 kg)  09/05/17 210 lb (95.3 kg)  08/21/17 210 lb (95.3 kg)   Temp Readings from Last 3 Encounters:  10/27/17 97.9 F (36.6 C) (Oral)  08/21/17 98.4 F (36.9 C) (Oral)  08/13/17 97.9 F (36.6 C) (Temporal)   BP Readings from Last 3 Encounters:  10/27/17 (!) 173/73  09/05/17 (!) 192/82  08/21/17 (!) 171/63   Pulse Readings from Last 3 Encounters:  10/27/17 (!) 55  09/05/17 70  08/21/17 65   Constitutional: Well-developed, well-nourished, and in no distress.   HENT: Head: Normocephalic and atraumatic.  Mouth/Throat: No oropharyngeal exudate. Mucosa moist. Eyes: Pupils are equal, round, and reactive to light. Conjunctivae are normal. No scleral icterus.  Neck: Normal range of motion. Neck supple. No JVD present.  Cardiovascular: Normal rate, regular rhythm and normal heart sounds.  Exam reveals no gallop and no friction rub.   No murmur heard. Pulmonary/Chest: Effort normal and breath sounds normal. No respiratory distress. No wheezes.No rales.  Abdominal: Soft. Bowel sounds are normal. No distension. There is no tenderness. There is no guarding.  Musculoskeletal: No edema or tenderness.  Lymphadenopathy: No cervical, axillaryor supraclavicular adenopathy.  Neurological: Alert and oriented to person, place, and time. No cranial nerve deficit.  Skin: Skin is warm and dry. No rash noted. No erythema. No pallor.  Psychiatric: Affect and judgment normal.   Labs No visits with results within 3 Day(s) from this visit.  Latest known visit with results is:  Appointment on 10/20/2017  Component Date Value Ref Range Status  . WBC 10/20/2017 8.9  4.0 - 10.5 K/uL Final  . RBC 10/20/2017 4.62  4.22 - 5.81 MIL/uL Final  . Hemoglobin 10/20/2017 14.1  13.0 - 17.0 g/dL Final   . HCT 10/20/2017 40.4  39.0 - 52.0 % Final  . MCV 10/20/2017 87.4  78.0 - 100.0 fL Final  . MCH 10/20/2017 30.5  26.0 - 34.0 pg Final  . MCHC 10/20/2017 34.9  30.0 - 36.0 g/dL Final  . RDW 10/20/2017 13.3  11.5 - 15.5 % Final  . Platelets 10/20/2017 189  150 - 400 K/uL Final  . Neutrophils Relative % 10/20/2017 41  % Final  . Neutro Abs 10/20/2017 3.7  1.7 - 7.7 K/uL Final  . Lymphocytes Relative 10/20/2017 49  % Final  . Lymphs Abs 10/20/2017 4.4* 0.7 - 4.0 K/uL Final  . Monocytes Relative 10/20/2017 6  % Final  . Monocytes Absolute 10/20/2017 0.6  0.1 - 1.0 K/uL Final  . Eosinophils Relative 10/20/2017 3  % Final  . Eosinophils Absolute 10/20/2017 0.3  0.0 - 0.7 K/uL Final  . Basophils Relative 10/20/2017 1  % Final  .  Basophils Absolute 10/20/2017 0.1  0.0 - 0.1 K/uL Final   Performed at [facility], [address] [place], [place] [address]  . Sodium 10/20/2017 141  135 - 145 mmol/L Final  . Potassium 10/20/2017 3.4* 3.5 - 5.1 mmol/L Final  . Chloride 10/20/2017 109  98 - 111 mmol/L Final  . CO2 10/20/2017 25  22 - 32 mmol/L Final  . Glucose, Bld 10/20/2017 120* 70 - 99 mg/dL Final  . BUN 10/20/2017 14  8 - 23 mg/dL Final  . Creatinine, Ser 10/20/2017 0.98  0.61 - 1.24 mg/dL Final  . Calcium 10/20/2017 9.2  8.9 - 10.3 mg/dL Final  . Total Protein 10/20/2017 7.3  6.5 - 8.1 g/dL Final  . Albumin 10/20/2017 4.2  3.5 - 5.0 g/dL Final  . AST 10/20/2017 23  15 - 41 U/L Final  . ALT 10/20/2017 27  0 - 44 U/L Final  . Alkaline Phosphatase 10/20/2017 59  38 - 126 U/L Final  . Total Bilirubin 10/20/2017 0.7  0.3 - 1.2 mg/dL Final  . GFR calc non Af Amer 10/20/2017 >60  >60 mL/min Final  . GFR calc Af Amer 10/20/2017 >60  >60 mL/min Final   Comment: (NOTE) The eGFR has been calculated using the CKD EPI equation. This calculation has not been validated in all clinical situations. eGFR's persistently <60 mL/min signify possible Chronic Kidney Disease.   Georgiann Hahn gap 10/20/2017 7  5 -  15 Final   Performed at [facility], [address], [place], [place] [address]  . LDH 10/20/2017 142  98 - 192 U/L Final   Performed at [facility], [address], [place], [place] [address]  . Ferritin 10/20/2017 117  24 - 336 ng/mL Final   Performed at [facility], [address] [place], [place] [address]  . Iron 10/20/2017 76  45 - 182 ug/dL Final  . TIBC 10/20/2017 354  250 - 450 ug/dL Final  . Saturation Ratios 10/20/2017 21  17.9 - 39.5 % Final  . UIBC 10/20/2017 278  ug/dL Final   Performed at [facility], [address], [place], [place] [address]     Pathology Orders Placed This Encounter  Procedures  . Phlebotomy therapeutic    1 unit 500 ml phlebotomy    Standing Status:   Future    Standing Expiration Date:   10/28/2018  . CBC with Differential/Platelet    Standing Status:   Future    Standing Expiration Date:   10/28/2018  . Comprehensive metabolic panel    Standing Status:   Future    Standing Expiration Date:   10/28/2018  . Lactate dehydrogenase    Standing Status:   Future    Standing Expiration Date:   10/28/2018  . Ferritin    Standing Status:   Future    Standing Expiration Date:   10/28/2018  . CBC with Differential/Platelet    Standing Status:   Future    Standing Expiration Date:   10/28/2019  . Comprehensive metabolic panel    Standing Status:   Future    Standing Expiration Date:   10/28/2019  . Lactate dehydrogenase    Standing Status:   Future    Standing Expiration Date:   10/28/2019  . Ferritin    Standing Status:   Future    Standing Expiration Date:   10/28/2019       Zoila Shutter MD

## 2018-01-26 ENCOUNTER — Other Ambulatory Visit (HOSPITAL_COMMUNITY): Payer: Self-pay

## 2018-04-20 ENCOUNTER — Other Ambulatory Visit (HOSPITAL_COMMUNITY): Payer: Self-pay

## 2018-04-27 ENCOUNTER — Ambulatory Visit (HOSPITAL_COMMUNITY): Payer: Self-pay | Admitting: Hematology

## 2018-05-31 DIAGNOSIS — H7202 Central perforation of tympanic membrane, left ear: Secondary | ICD-10-CM | POA: Insufficient documentation

## 2018-06-10 ENCOUNTER — Encounter: Payer: Self-pay | Admitting: Orthopaedic Surgery

## 2019-09-26 IMAGING — MR MR SHOULDER*R* W/O CM
4 of 5 series · 16 of 40 positions shown · non-contrast
Comparison: None.

CLINICAL DATA: Decreased range of motion. Sharp pain in the right
shoulder.

EXAM:
MRI OF THE RIGHT SHOULDER WITHOUT CONTRAST
TECHNIQUE: Multiplanar, multisequence MR imaging of the shoulder was performed.
No intravenous contrast was administered.

[Series 6: T2 fat-sat · axial · right · 3.0mm · 0.44mm/px · z∈[-16,+38]mm · 3 of 23 slices shown (1 of 3)]
[im 4/23]
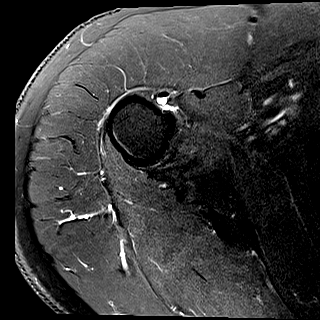
[im 13/23]
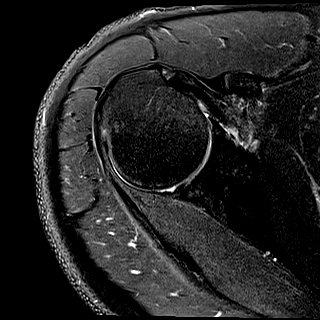
[im 19/23]
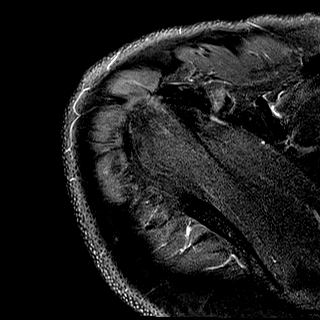

[Series 7: T2 fat-sat · oblique · right · 3.0mm · 0.44mm/px · 3 of 21 slices shown (2 of 3)]
[im 3/21]
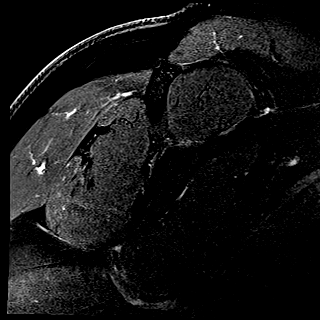
[im 12/21]
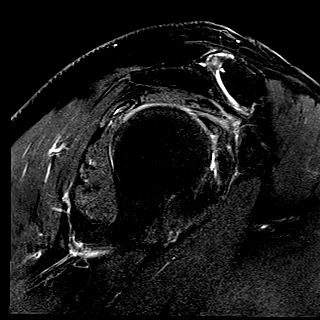
[im 18/21]
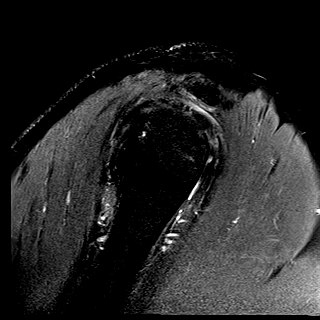

[Series 9: PD · oblique · right · 3.0mm · 0.18mm/px · 7 of 23 slices shown]
[im 1/23]
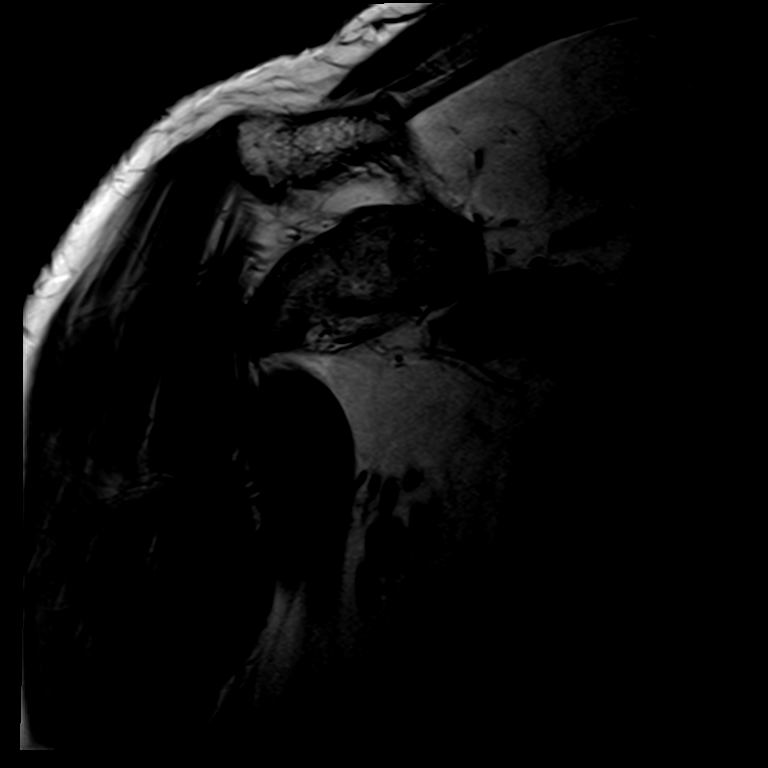
[im 4/23]
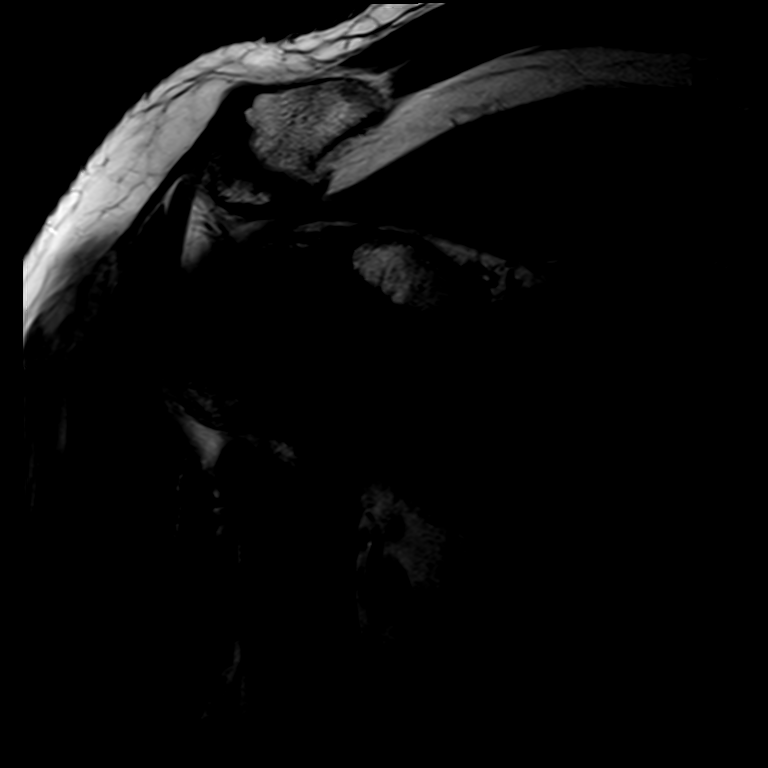
[im 7/23]
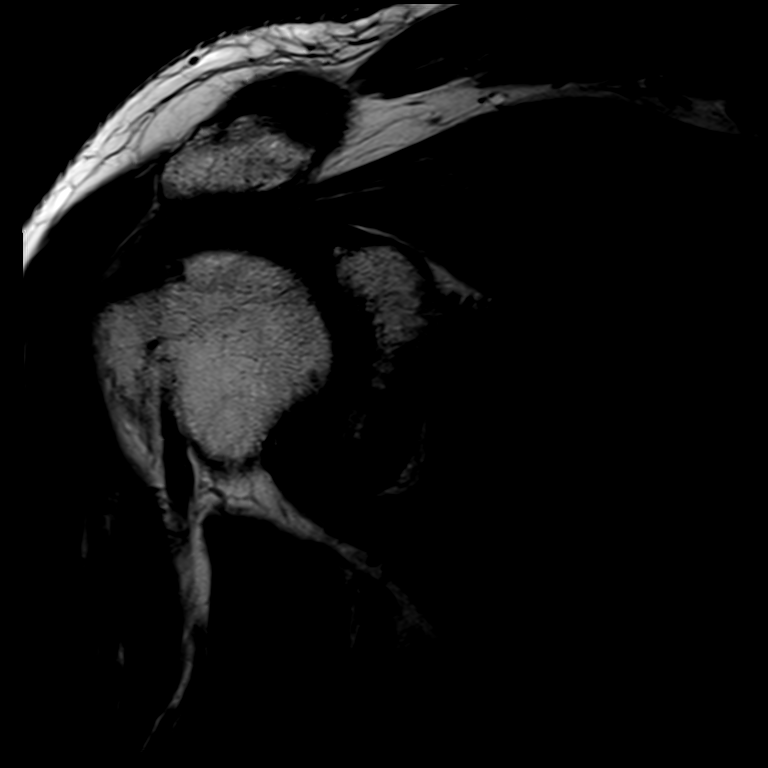
[im 10/23]
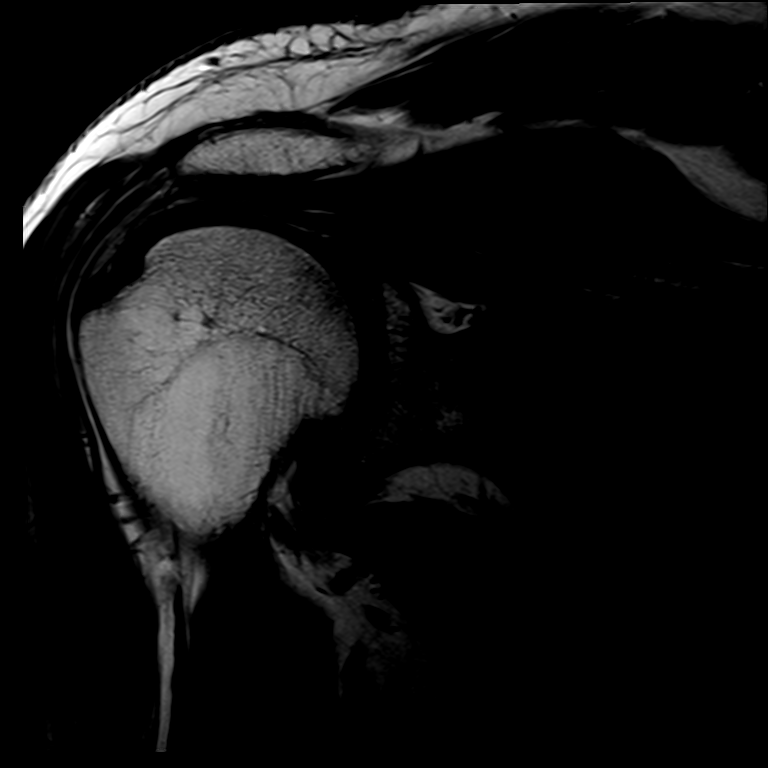
[im 13/23]
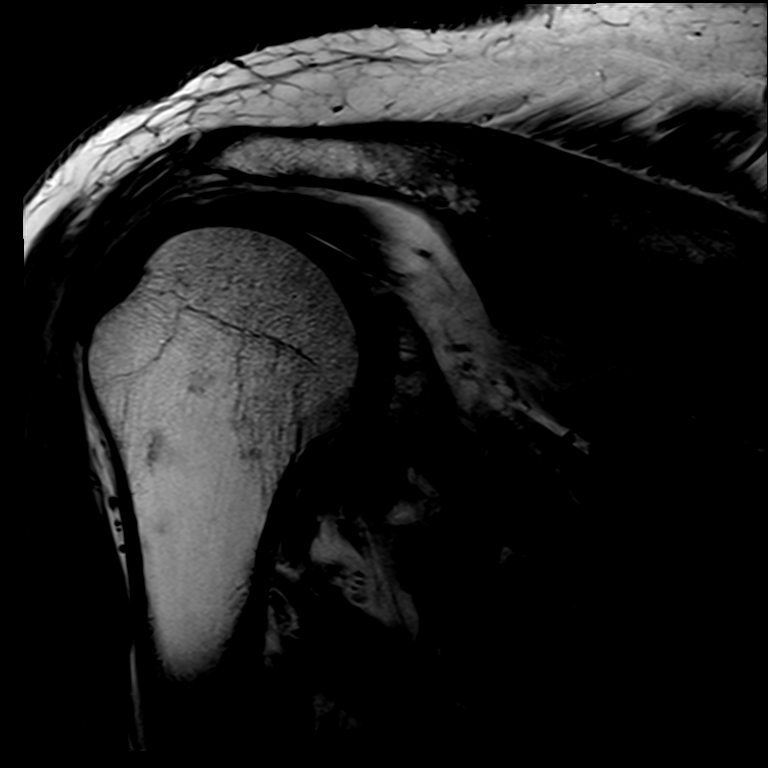
[im 16/23]
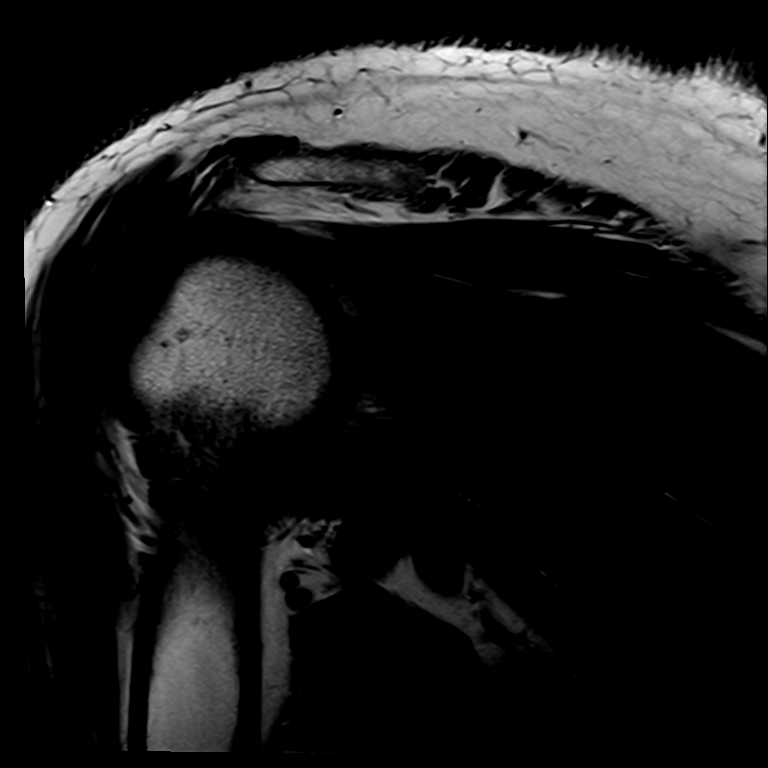
[im 19/23]
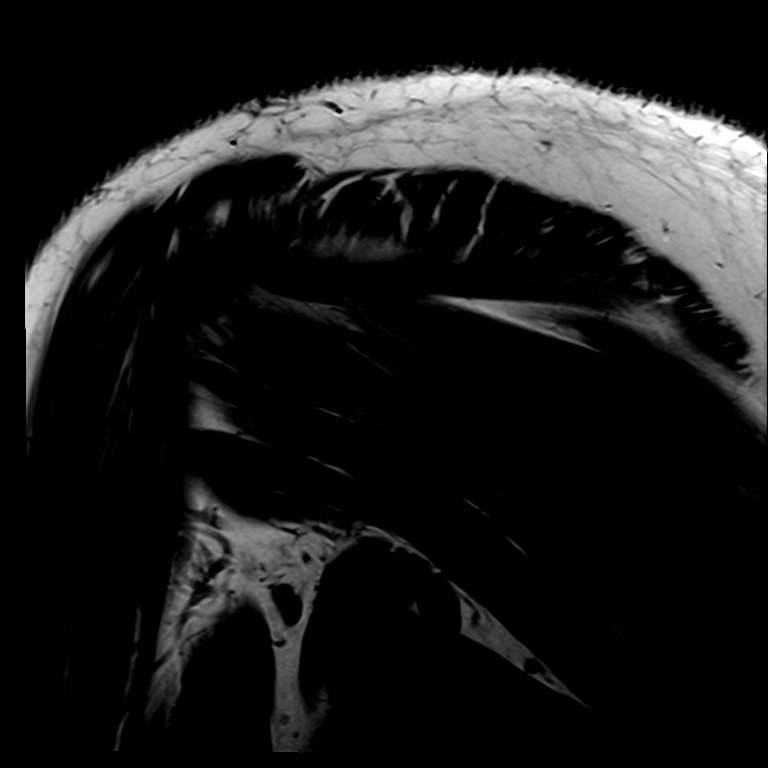

[Series 10: T2 fat-sat · oblique · right · 3.0mm · 0.36mm/px · 3 of 23 slices shown (3 of 3)]
[im 4/23]
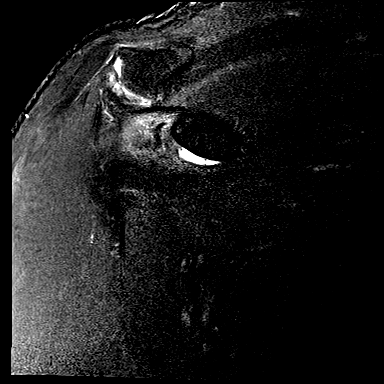
[im 13/23]
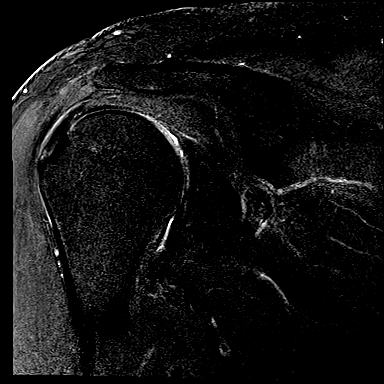
[im 19/23]
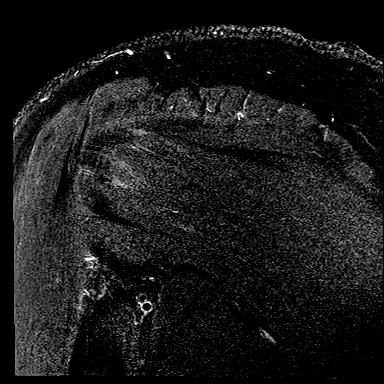

[16 of 40 positions shown; findings below may reference images not displayed]

FINDINGS: Rotator cuff: Mild tendinosis of the supraspinatus tendon with a
small partial-thickness tear of the bursal surface. Mild tendinosis
of the infraspinatus tendon. Teres minor tendon is intact. Mild
tendinosis of the subscapularis tendon.

Muscles: No atrophy or fatty replacement of nor abnormal signal
within, the muscles of the rotator cuff.

Biceps long head: Moderate tendinosis of the intra-articular portion
of the long head of the biceps tendon.

Acromioclavicular Joint: Moderate arthropathy of the
acromioclavicular joint. Type II acromion. No significant
subacromial/subdeltoid bursal fluid.

Glenohumeral Joint: No joint effusion.  No chondral defect.

Labrum: Limited evaluation secondary lack of intra-articular fluid.
Degeneration of the superior posterior labrum with possible small
degenerative tear.

Bones:  No acute osseous abnormality.  No aggressive osseous lesion.

Other: No fluid collection or hematoma.
IMPRESSION: 1. Mild tendinosis of the supraspinatus tendon with a small
partial-thickness tear of the bursal surface.
2. Mild tendinosis of the infraspinatus tendon.
3. Mild tendinosis of the subscapularis tendon.
4. Mild tendinosis of the intra-articular portion of the long head
of the biceps tendon.

## 2020-04-21 ENCOUNTER — Ambulatory Visit: Payer: BLUE CROSS/BLUE SHIELD | Admitting: Orthopaedic Surgery

## 2020-04-30 ENCOUNTER — Other Ambulatory Visit: Payer: Self-pay

## 2020-04-30 ENCOUNTER — Ambulatory Visit (INDEPENDENT_AMBULATORY_CARE_PROVIDER_SITE_OTHER): Payer: BC Managed Care – PPO | Admitting: Orthopaedic Surgery

## 2020-04-30 ENCOUNTER — Ambulatory Visit: Payer: Self-pay

## 2020-04-30 DIAGNOSIS — M545 Low back pain, unspecified: Secondary | ICD-10-CM | POA: Diagnosis not present

## 2020-04-30 MED ORDER — NAPROXEN 500 MG PO TABS: 500.0000 mg | ORAL_TABLET | Freq: Two times a day (BID) | ORAL | 5 refills | Status: DC

## 2020-04-30 NOTE — Progress Notes (Signed)
Office Visit Note   Patient: Stephen Fuller           Date of Birth: 08-26-1955           MRN: [id number] Visit Date: 04/30/2020              Requested by: Fleet Contras, MD [address] [place],  [place] [address] PCP: Fleet Contras, MD   Assessment & Plan: Visit Diagnoses:  1. Low back pain, unspecified back pain laterality, unspecified chronicity, unspecified whether sciatica present     Plan: We will set him up for some physical therapy for treatment low back pain.  Recheck 5 weeks.   Naprosyn 500 mg p.o. twice daily prescribed.  Follow-Up Instructions: No follow-ups on file.   Orders:  Orders Placed This Encounter  Procedures  . XR Lumbar Spine 2-3 Views   No orders of the defined types were placed in this encounter.     Procedures: No procedures performed   Clinical Data: No additional findings.   Subjective: Chief Complaint  Patient presents with  . Lower Back - Pain    HPI 65 year old male seen with right-sided back pain he states his back is messed up and giving him problems.  He has had pain for 2 months.  Previous treatment a few years ago with similar type problems.  No numbness or tingling in his feet.  Pain wakes him up at night.  He works as a Veterinary surgeon.  His PCP gave him some Flexeril but did not really notice any relief.  Patient is a smoker with some mild aortic calcification on plain radiographs lumbar spine.  Smokes half pack per day also has some hypertension.  Review of Systems positive smoking, hypertension.  Negative for coronary artery disease.  Carrier of hemochromatosis gene mutation.  All other systems are noncontributory to HPI.   Objective: Vital Signs: There were no vitals taken for this visit.  Physical Exam Constitutional:      Appearance: He is well-developed.  HENT:     Head: Normocephalic and atraumatic.  Eyes:     Pupils: Pupils are equal, round, and reactive to light.  Neck:     Thyroid: No thyromegaly.      Trachea: No tracheal deviation.  Cardiovascular:     Rate and Rhythm: Normal rate.  Pulmonary:     Effort: Pulmonary effort is normal.     Breath sounds: No wheezing.  Abdominal:     General: Bowel sounds are normal.     Palpations: Abdomen is soft.  Skin:    General: Skin is warm and dry.     Capillary Refill: Capillary refill takes less than 2 seconds.  Neurological:     Mental Status: He is alert and oriented to person, place, and time.  Psychiatric:        Behavior: Behavior normal.        Thought Content: Thought content normal.        Judgment: Judgment normal.     Ortho Exam patient has intact knee and ankle jerk negative logroll of the hips minimal trochanteric bursal tenderness some mild right paralumbar tenderness L2-L5.  Negative FABER test.  Quads anterior tib EHL is strong.  Specialty Comments:  No specialty comments available.  Imaging: No results found.   PMFS History: Patient Active Problem List   Diagnosis Date Noted  . 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 SOB (shortness of breath) 01/28/2013  . Chest pain 02/16/2012  . Essential  hypertension 02/16/2012  . Palpitations 02/16/2012  . Elevated LFTs 02/16/2012   Past Medical History:  Diagnosis Date  . Arthritis   . Chest pain   . Hepatitis C virus infection cured after antiviral drug therapy   . Hypertension   . Palpitations     Family History  Problem Relation Age of Onset  . Diabetes Mother   . Stroke Mother   . Hypertension Mother   . Heart disease Unknown        No family history  . Diabetes Brother   . Cirrhosis Brother   . Colon cancer Neg Hx     Past Surgical History:  Procedure Laterality Date  . CHOLECYSTECTOMY  2005  . COLONOSCOPY  2019  . LIVER BIOPSY  2019   Social History   Occupational History  . Occupation: UNEMPLOYED  Tobacco Use  . Smoking status: Current Every Day Smoker    Packs/day: 1.00    Years: 37.00    Pack years: 37.00    Types:  Cigarettes  . Smokeless tobacco: Never Used  Vaping Use  . Vaping Use: Never used  Substance and Sexual Activity  . Alcohol use: Yes    Alcohol/week: 2.0 standard drinks    Types: 2 Standard drinks or equivalent per week    Comment: Occasional  . Drug use: No  . Sexual activity: Not on file

## 2020-06-04 ENCOUNTER — Ambulatory Visit: Payer: BC Managed Care – PPO | Admitting: Orthopaedic Surgery

## 2020-06-04 ENCOUNTER — Other Ambulatory Visit: Payer: Self-pay

## 2020-06-10 ENCOUNTER — Other Ambulatory Visit (HOSPITAL_COMMUNITY): Payer: Self-pay | Admitting: Neurology

## 2020-06-10 DIAGNOSIS — M25551 Pain in right hip: Secondary | ICD-10-CM

## 2020-06-10 DIAGNOSIS — M545 Low back pain, unspecified: Secondary | ICD-10-CM

## 2020-06-10 DIAGNOSIS — M25552 Pain in left hip: Secondary | ICD-10-CM

## 2020-06-11 ENCOUNTER — Other Ambulatory Visit (HOSPITAL_COMMUNITY): Payer: Self-pay | Admitting: Neurology

## 2020-06-11 ENCOUNTER — Ambulatory Visit (HOSPITAL_COMMUNITY)
Admission: RE | Admit: 2020-06-11 | Discharge: 2020-06-11 | Disposition: A | Discharge status: Home / Self Care | Payer: BC Managed Care – PPO | Source: Ambulatory Visit | Attending: Neurology | Admitting: Neurology

## 2020-06-11 DIAGNOSIS — M25551 Pain in right hip: Secondary | ICD-10-CM | POA: Diagnosis present

## 2020-06-11 DIAGNOSIS — M25552 Pain in left hip: Secondary | ICD-10-CM | POA: Diagnosis present

## 2020-06-11 DIAGNOSIS — M545 Low back pain, unspecified: Secondary | ICD-10-CM | POA: Insufficient documentation

## 2020-09-24 ENCOUNTER — Other Ambulatory Visit: Payer: Self-pay | Admitting: Neurology

## 2020-09-24 ENCOUNTER — Other Ambulatory Visit (HOSPITAL_COMMUNITY): Payer: Self-pay | Admitting: Neurology

## 2020-09-24 DIAGNOSIS — M545 Low back pain, unspecified: Secondary | ICD-10-CM

## 2020-09-24 DIAGNOSIS — G8929 Other chronic pain: Secondary | ICD-10-CM

## 2020-10-07 ENCOUNTER — Ambulatory Visit (HOSPITAL_COMMUNITY): Payer: BC Managed Care – PPO

## 2020-10-07 ENCOUNTER — Encounter (HOSPITAL_COMMUNITY): Payer: Self-pay

## 2020-11-17 ENCOUNTER — Other Ambulatory Visit: Payer: Self-pay

## 2020-11-17 ENCOUNTER — Ambulatory Visit (HOSPITAL_COMMUNITY)
Admission: RE | Admit: 2020-11-17 | Discharge: 2020-11-17 | Disposition: A | Discharge status: Home / Self Care | Payer: BC Managed Care – PPO | Source: Ambulatory Visit | Attending: Neurology | Admitting: Neurology

## 2020-11-17 DIAGNOSIS — M545 Low back pain, unspecified: Secondary | ICD-10-CM | POA: Diagnosis present

## 2020-11-17 DIAGNOSIS — G8929 Other chronic pain: Secondary | ICD-10-CM | POA: Diagnosis present

## 2021-04-09 ENCOUNTER — Other Ambulatory Visit: Payer: Self-pay

## 2021-04-09 ENCOUNTER — Other Ambulatory Visit (HOSPITAL_COMMUNITY): Payer: Self-pay | Admitting: Neurology

## 2021-04-09 ENCOUNTER — Ambulatory Visit (HOSPITAL_COMMUNITY)
Admission: RE | Admit: 2021-04-09 | Discharge: 2021-04-09 | Disposition: A | Discharge status: Home / Self Care | Payer: BC Managed Care – PPO | Source: Ambulatory Visit | Attending: Neurology | Admitting: Neurology

## 2021-04-09 DIAGNOSIS — M25511 Pain in right shoulder: Secondary | ICD-10-CM | POA: Insufficient documentation

## 2021-04-09 DIAGNOSIS — M79601 Pain in right arm: Secondary | ICD-10-CM | POA: Insufficient documentation

## 2021-04-09 DIAGNOSIS — M542 Cervicalgia: Secondary | ICD-10-CM

## 2021-04-09 DIAGNOSIS — M25512 Pain in left shoulder: Secondary | ICD-10-CM

## 2021-04-09 DIAGNOSIS — M79602 Pain in left arm: Secondary | ICD-10-CM | POA: Diagnosis present

## 2021-07-01 DIAGNOSIS — M545 Low back pain, unspecified: Secondary | ICD-10-CM | POA: Diagnosis not present

## 2021-07-01 DIAGNOSIS — M25512 Pain in left shoulder: Secondary | ICD-10-CM | POA: Diagnosis not present

## 2021-07-01 DIAGNOSIS — G5603 Carpal tunnel syndrome, bilateral upper limbs: Secondary | ICD-10-CM | POA: Diagnosis not present

## 2021-07-01 DIAGNOSIS — I10 Essential (primary) hypertension: Secondary | ICD-10-CM | POA: Diagnosis not present

## 2021-07-01 DIAGNOSIS — M25552 Pain in left hip: Secondary | ICD-10-CM | POA: Diagnosis not present

## 2021-07-01 DIAGNOSIS — M542 Cervicalgia: Secondary | ICD-10-CM | POA: Diagnosis not present

## 2021-07-01 DIAGNOSIS — M25551 Pain in right hip: Secondary | ICD-10-CM | POA: Diagnosis not present

## 2021-07-07 ENCOUNTER — Ambulatory Visit (INDEPENDENT_AMBULATORY_CARE_PROVIDER_SITE_OTHER): Payer: BC Managed Care – PPO | Admitting: Family Medicine

## 2021-07-07 ENCOUNTER — Encounter: Payer: Self-pay | Admitting: Family Medicine

## 2021-07-07 VITALS — BP 162/84 | HR 61 | Ht 72.0 in | Wt 214.0 lb

## 2021-07-07 DIAGNOSIS — M10071 Idiopathic gout, right ankle and foot: Secondary | ICD-10-CM

## 2021-07-07 DIAGNOSIS — I10 Essential (primary) hypertension: Secondary | ICD-10-CM

## 2021-07-07 DIAGNOSIS — M109 Gout, unspecified: Secondary | ICD-10-CM | POA: Insufficient documentation

## 2021-07-07 DIAGNOSIS — R7301 Impaired fasting glucose: Secondary | ICD-10-CM

## 2021-07-07 DIAGNOSIS — Z720 Tobacco use: Secondary | ICD-10-CM

## 2021-07-07 DIAGNOSIS — E559 Vitamin D deficiency, unspecified: Secondary | ICD-10-CM

## 2021-07-07 DIAGNOSIS — Z114 Encounter for screening for human immunodeficiency virus [HIV]: Secondary | ICD-10-CM | POA: Diagnosis not present

## 2021-07-07 MED ORDER — AMLODIPINE-OLMESARTAN 10-40 MG PO TABS: 1.0000 | ORAL_TABLET | Freq: Every day | ORAL | 2 refills | Status: DC

## 2021-07-07 MED ORDER — ALLOPURINOL 300 MG PO TABS: 300.0000 mg | ORAL_TABLET | Freq: Every day | ORAL | 2 refills | Status: DC

## 2021-07-07 NOTE — Assessment & Plan Note (Signed)
Smokes about 1/2 pack/day  Asked about quitting: confirms that he/she currently smokes cigarettes Advise to quit smoking: Educated about QUITTING to reduce the risk of cancer, cardio and cerebrovascular disease. Assess willingness: Unwilling to quit at this time, but is working on cutting back. Assist with counseling and pharmacotherapy: Counseled for 5 minutes and literature provided. Arrange for follow up: follow up in 3 months and continue to offer help. 

## 2021-07-07 NOTE — Patient Instructions (Addendum)
I appreciate the opportunity to provide care to you today!    Follow up:  1 months  Labs: please stop by the lab today to get your blood drawn (CBC, CMP, TSH, Lipid profile, HgA1c, Vit D)  Please pick up your medication at the pharmacy  Screening: HIV    Please stop by your local pharmacy and get your Tdap and Shingles vaccine  Please continue to a heart-healthy diet and increase your physical activities. Try to exercise for at least three times a week.      It was a pleasure to see you and I look forward to continuing to work together on your health and well-being. Please do not hesitate to call the office if you need care or have questions about your care.   Have a wonderful day and week. With Gratitude, [name] MSN, FNP-BC

## 2021-07-07 NOTE — Assessment & Plan Note (Addendum)
-  uncontrolled  -will d/c his lisinopril 40mg  and amlodipine 5mg  and will start him on a combination medication to decrease his bill burden  -advised to continue taking metoprolol -azor ordered and will reassess in 1 month -if BP is still elevated, will consider adding HCT

## 2021-07-07 NOTE — Assessment & Plan Note (Addendum)
-  refilled maintenance therapy ( allopurinol)  -no acute symptoms or flair up today

## 2021-07-07 NOTE — Progress Notes (Addendum)
New Patient Office Visit  Subjective:  Patient ID: Stephen Fuller, male    DOB: [birth year]  Age: 66 y.o. MRN: [id number]  CC:  Chief Complaint  Patient presents with   New Patient (Initial Visit)    Pt establishing care, no health concerns.     HPI [name] is a 66 y.o. male with past medical history of uncontrolled hypertension and gout presents for establishing care. He mentions ambulatory readings in the 409W systolic and 11B diastolic. He noted having consistently elevated blood pressure and white coat syndrome with increased anxiety at the doctor's office. He smokes 1/2 pack of cigarettes daily and denies headaches, dizziness, and blurred vision. He reports back and neck pain and notes having a bulging disc in his back and a pinched nerve in his neck.     Past Medical History:  Diagnosis Date   Arthritis    Chest pain    Hepatitis C virus infection cured after antiviral drug therapy    Hypertension    Palpitations     Past Surgical History:  Procedure Laterality Date   CHOLECYSTECTOMY  2005   COLONOSCOPY  2019   LIVER BIOPSY  2019    Family History  Problem Relation Age of Onset   Diabetes Mother    Stroke Mother    Hypertension Mother    Heart disease Unknown        No family history   Diabetes Brother    Cirrhosis Brother    Colon cancer Neg Hx     Social History   Socioeconomic History   Marital status: Married    Spouse name: Not on file   Number of children: 2   Years of education: Not on file   Highest education level: Not on file  Occupational History   Occupation: UNEMPLOYED  Tobacco Use   Smoking status: Every Day    Packs/day: 1.00    Years: 37.00    Pack years: 37.00    Types: Cigarettes   Smokeless tobacco: Never  Vaping Use   Vaping Use: Never used  Substance and Sexual Activity   Alcohol use: Yes    Alcohol/week: 2.0 standard drinks    Types: 2 Standard drinks or equivalent per week    Comment: Occasional   Drug use: No    Sexual activity: Not on file  Other Topics Concern   Not on file  Social History Narrative   Not on file   Social Determinants of Health   Financial Resource Strain: Not on file  Food Insecurity: Not on file  Transportation Needs: Not on file  Physical Activity: Not on file  Stress: Not on file  Social Connections: Not on file  Intimate Partner Violence: Not on file    ROS Review of Systems  Constitutional:  Negative for chills, fatigue and fever.  HENT:  Negative for congestion, rhinorrhea, sinus pressure and sinus pain.   Eyes:  Negative for photophobia, pain, discharge and itching.  Respiratory:  Negative for cough, chest tightness and shortness of breath.   Cardiovascular:  Negative for chest pain and palpitations.  Gastrointestinal:  Negative for constipation, diarrhea, nausea and vomiting.  Genitourinary:  Negative for dysuria, frequency and urgency.  Musculoskeletal:  Positive for back pain (bulging disc) and neck pain (pinched nerve).  Skin:  Negative for rash and wound.  Neurological:  Negative for dizziness, tremors and numbness.  Psychiatric/Behavioral:  Positive for sleep disturbance (From numbness and tingling in both hands that wakes him  up at night). Negative for confusion, self-injury and suicidal ideas.    Objective:   Today's Vitals: BP (!) 162/84   Pulse 61   Ht 6' (1.829 m)   Wt 214 lb (97.1 kg)   SpO2 99%   BMI 29.02 kg/m   Physical Exam Constitutional:      Appearance: Normal appearance.  HENT:     Head: Normocephalic.     Right Ear: External ear normal.     Left Ear: External ear normal.     Nose: No congestion or rhinorrhea.     Mouth/Throat:     Mouth: Mucous membranes are moist.  Eyes:     General:        Right eye: No discharge.        Left eye: No discharge.     Extraocular Movements: Extraocular movements intact.     Pupils: Pupils are equal, round, and reactive to light.  Cardiovascular:     Rate and Rhythm: Normal rate and  regular rhythm.     Pulses: Normal pulses.     Heart sounds: Normal heart sounds.  Pulmonary:     Effort: Pulmonary effort is normal.     Breath sounds: Normal breath sounds.  Abdominal:     Palpations: Abdomen is soft.  Musculoskeletal:     Cervical back: No rigidity.     Right lower leg: No edema.     Left lower leg: No edema.  Skin:    Findings: No lesion or rash.  Neurological:     Mental Status: He is alert and oriented to person, place, and time.  Psychiatric:     Comments: Normal affect    Assessment & Plan:   Problem List Items Addressed This Visit       Cardiovascular and Mediastinum   Essential hypertension - Primary (Chronic)    -uncontrolled  -will d/c his lisinopril 59m and amlodipine 522mand will start him on a combination medication to decrease his bill burden  -advised to continue taking metoprolol -azor ordered and will reassess in 1 month -if BP is still elevated, will consider adding HCT         Relevant Medications   rosuvastatin (CRESTOR) 10 MG tablet   amLODipine-olmesartan (AZOR) 10-40 MG tablet     Musculoskeletal and Integument   Gout of right foot    -refilled maintenance therapy ( allopurinol)  -no acute symptoms or flair up today       Relevant Medications   HYDROcodone-acetaminophen (NORCO) 10-325 MG tablet   allopurinol (ZYLOPRIM) 300 MG tablet     Other   Tobacco use    Smokes about 1/2 pack/day  Asked about quitting: confirms that he/she currently smokes cigarettes Advise to quit smoking: Educated about QUITTING to reduce the risk of cancer, cardio and cerebrovascular disease. Assess willingness: Unwilling to quit at this time, but is working on cutting back. Assist with counseling and pharmacotherapy: Counseled for 5 minutes and literature provided. Arrange for follow up: follow up in 3 months and continue to offer help.      Other Visit Diagnoses     Encounter for screening for HIV       Relevant Orders   HIV  antibody (with reflex)   Idiopathic gout of right ankle, unspecified chronicity       Relevant Medications   HYDROcodone-acetaminophen (NORCO) 10-325 MG tablet   allopurinol (ZYLOPRIM) 300 MG tablet   Other Relevant Orders   CBC with Differential/Platelet   CMP14+EGFR  TSH + free T4   Lipid panel   Vitamin D deficiency       Relevant Orders   Vitamin D (25 hydroxy)   IFG (impaired fasting glucose)       Relevant Orders   Hemoglobin A1C       Outpatient Encounter Medications as of 07/07/2021  Medication Sig   amLODipine-olmesartan (AZOR) 10-40 MG tablet Take 1 tablet by mouth daily.   aspirin EC 81 MG tablet Take 1 tablet (81 mg total) by mouth daily.   busPIRone (BUSPAR) 5 MG tablet Take by mouth.   gabapentin (NEURONTIN) 400 MG capsule Take by mouth.   HYDROcodone-acetaminophen (NORCO) 10-325 MG tablet Take 1 tablet by mouth every 6 (six) hours as needed.   metoprolol tartrate (LOPRESSOR) 50 MG tablet Take 1 tablet by mouth 2 (two) times daily.   rosuvastatin (CRESTOR) 10 MG tablet Take by mouth.   [DISCONTINUED] allopurinol (ZYLOPRIM) 300 MG tablet Take 300 mg by mouth daily.   [DISCONTINUED] amLODipine (NORVASC) 5 MG tablet Take by mouth.   [DISCONTINUED] lisinopril (ZESTRIL) 40 MG tablet Take by mouth.   acetaminophen (TYLENOL) 500 MG tablet Take 1 tablet (500 mg total) by mouth every 6 (six) hours as needed. (Patient not taking: Reported on 07/07/2021)   allopurinol (ZYLOPRIM) 300 MG tablet Take 1 tablet (300 mg total) by mouth daily.   HYDROcodone-acetaminophen (NORCO) 7.5-325 MG tablet One every six hours as needed for pain.  Seven day limit (Patient not taking: Reported on 10/27/2017)   naproxen (NAPROSYN) 500 MG tablet Take 1 tablet (500 mg total) by mouth 2 (two) times daily with a meal. (Patient not taking: Reported on 07/07/2021)   Facility-Administered Encounter Medications as of 07/07/2021  Medication   0.9 %  sodium chloride infusion    Follow-up: Return in about  1 month (around 08/07/2021) for BP.   Alvira Monday, FNP

## 2021-07-08 DIAGNOSIS — Z114 Encounter for screening for human immunodeficiency virus [HIV]: Secondary | ICD-10-CM | POA: Diagnosis not present

## 2021-07-08 DIAGNOSIS — R7301 Impaired fasting glucose: Secondary | ICD-10-CM | POA: Diagnosis not present

## 2021-07-08 DIAGNOSIS — R0602 Shortness of breath: Secondary | ICD-10-CM | POA: Diagnosis not present

## 2021-07-08 DIAGNOSIS — M10071 Idiopathic gout, right ankle and foot: Secondary | ICD-10-CM | POA: Diagnosis not present

## 2021-07-08 DIAGNOSIS — R7989 Other specified abnormal findings of blood chemistry: Secondary | ICD-10-CM | POA: Diagnosis not present

## 2021-07-08 DIAGNOSIS — E559 Vitamin D deficiency, unspecified: Secondary | ICD-10-CM | POA: Diagnosis not present

## 2021-07-09 LAB — CBC WITH DIFFERENTIAL/PLATELET
Basophils Absolute: 0.1 10*3/uL (ref 0.0–0.2)
Basos: 1 %
EOS (ABSOLUTE): 0.3 10*3/uL (ref 0.0–0.4)
Eos: 3 %
Hematocrit: 46.1 % (ref 37.5–51.0)
Hemoglobin: 16.1 g/dL (ref 13.0–17.7)
Immature Grans (Abs): 0 10*3/uL (ref 0.0–0.1)
Immature Granulocytes: 0 %
Lymphocytes Absolute: 3.6 10*3/uL — ABNORMAL HIGH (ref 0.7–3.1)
Lymphs: 41 %
MCH: 30.8 pg (ref 26.6–33.0)
MCHC: 34.9 g/dL (ref 31.5–35.7)
MCV: 88 fL (ref 79–97)
Monocytes Absolute: 0.6 10*3/uL (ref 0.1–0.9)
Monocytes: 7 %
Neutrophils Absolute: 4.3 10*3/uL (ref 1.4–7.0)
Neutrophils: 48 %
Platelets: 202 10*3/uL (ref 150–450)
RBC: 5.22 x10E6/uL (ref 4.14–5.80)
RDW: 13 % (ref 11.6–15.4)
WBC: 8.9 10*3/uL (ref 3.4–10.8)

## 2021-07-09 LAB — CMP14+EGFR
ALT: 31 IU/L (ref 0–44)
AST: 24 IU/L (ref 0–40)
Albumin/Globulin Ratio: 1.9 (ref 1.2–2.2)
Albumin: 4.4 g/dL (ref 3.8–4.8)
Alkaline Phosphatase: 63 IU/L (ref 44–121)
BUN/Creatinine Ratio: 13 (ref 10–24)
BUN: 17 mg/dL (ref 8–27)
Bilirubin Total: 0.4 mg/dL (ref 0.0–1.2)
CO2: 22 mmol/L (ref 20–29)
Calcium: 9.6 mg/dL (ref 8.6–10.2)
Chloride: 105 mmol/L (ref 96–106)
Creatinine, Ser: 1.3 mg/dL — ABNORMAL HIGH (ref 0.76–1.27)
Globulin, Total: 2.3 g/dL (ref 1.5–4.5)
Glucose: 125 mg/dL — ABNORMAL HIGH (ref 70–99)
Potassium: 4.7 mmol/L (ref 3.5–5.2)
Sodium: 140 mmol/L (ref 134–144)
Total Protein: 6.7 g/dL (ref 6.0–8.5)
eGFR: 61 mL/min/{1.73_m2} (ref >59)

## 2021-07-09 LAB — LIPID PANEL
Chol/HDL Ratio: 2.5 ratio (ref 0.0–5.0)
Cholesterol, Total: 99 mg/dL — ABNORMAL LOW (ref 100–199)
HDL: 40 mg/dL (ref >39)
LDL Chol Calc (NIH): 38 mg/dL (ref 0–99)
Triglycerides: 113 mg/dL (ref 0–149)
VLDL Cholesterol Cal: 21 mg/dL (ref 5–40)

## 2021-07-09 LAB — HEMOGLOBIN A1C
Est. average glucose Bld gHb Est-mCnc: 114 mg/dL
Hgb A1c MFr Bld: 5.6 % (ref 4.8–5.6)

## 2021-07-09 LAB — TSH+FREE T4
Free T4: 1.14 ng/dL (ref 0.82–1.77)
TSH: 1.31 u[IU]/mL (ref 0.450–4.500)

## 2021-07-09 LAB — VITAMIN D 25 HYDROXY (VIT D DEFICIENCY, FRACTURES): Vit D, 25-Hydroxy: 42.9 ng/mL (ref 30.0–100.0)

## 2021-07-09 LAB — HIV ANTIBODY (ROUTINE TESTING W REFLEX): HIV Screen 4th Generation wRfx: NONREACTIVE

## 2021-07-09 NOTE — Progress Notes (Signed)
Inform the patient that his labs are within normal limits. Please advise him to increase his fluid intake.

## 2021-08-09 ENCOUNTER — Encounter: Payer: Self-pay | Admitting: Family Medicine

## 2021-08-09 ENCOUNTER — Ambulatory Visit (INDEPENDENT_AMBULATORY_CARE_PROVIDER_SITE_OTHER): Payer: BC Managed Care – PPO | Admitting: Family Medicine

## 2021-08-09 VITALS — BP 140/82 | HR 68 | Ht 72.0 in | Wt 216.0 lb

## 2021-08-09 DIAGNOSIS — I10 Essential (primary) hypertension: Secondary | ICD-10-CM

## 2021-08-09 DIAGNOSIS — R7989 Other specified abnormal findings of blood chemistry: Secondary | ICD-10-CM

## 2021-08-23 ENCOUNTER — Encounter: Payer: Medicare HMO | Admitting: Family Medicine

## 2021-08-23 ENCOUNTER — Ambulatory Visit (INDEPENDENT_AMBULATORY_CARE_PROVIDER_SITE_OTHER): Payer: BC Managed Care – PPO | Admitting: Family Medicine

## 2021-08-23 ENCOUNTER — Encounter: Payer: Self-pay | Admitting: Family Medicine

## 2021-08-23 DIAGNOSIS — I10 Essential (primary) hypertension: Secondary | ICD-10-CM | POA: Diagnosis not present

## 2021-08-23 NOTE — Patient Instructions (Signed)
I appreciate the opportunity to provide care to you today!    Follow up:  2 months CPE    Please continue to a heart-healthy diet and increase your physical activities. Try to exercise for at least three times a week.      It was a pleasure to see you and I look forward to continuing to work together on your health and well-being. Please do not hesitate to call the office if you need care or have questions about your care.   Have a wonderful day and week. With Gratitude, [name] MSN, FNP-BC

## 2021-08-23 NOTE — Progress Notes (Signed)
 Established Patient Office Visit  Subjective:  Patient ID: Stephen Fuller, male    DOB: 08/25/1955  Age: 66 y.o. MRN: [id number]  CC:  Chief Complaint  Patient presents with   Follow-up    Pt following up for blood pressure, reading have been good, has trouble sometimes putting cuff on to get an accurate reading. Has forms to be completed for handicap sticker.     HPI Stephen Fuller is a 66 y.o. male with past medical history of essential hypertension presents for f/u of  chronic medical conditions. HTN: BP was controlled today. Complaint with the treatment regimen. Notes to checking his BP at home and exercising regularly. Adherence to [facility]'s eating plan.   Past Medical History:  Diagnosis Date   Arthritis    Chest pain    Hepatitis C virus infection cured after antiviral drug therapy    Hypertension    Palpitations     Past Surgical History:  Procedure Laterality Date   CHOLECYSTECTOMY  2005   COLONOSCOPY  2019   LIVER BIOPSY  2019    Family History  Problem Relation Age of Onset   Diabetes Mother    Stroke Mother    Hypertension Mother    Heart disease Unknown        No family history   Diabetes Brother    Cirrhosis Brother    Colon cancer Neg Hx     Social History   Socioeconomic History   Marital status: Married    Spouse name: Not on file   Number of children: 2   Years of education: Not on file   Highest education level: Not on file  Occupational History   Occupation: UNEMPLOYED  Tobacco Use   Smoking status: Every Day    Packs/day: 1.00    Years: 37.00    Total pack years: 37.00    Types: Cigarettes   Smokeless tobacco: Never  Vaping Use   Vaping Use: Never used  Substance and Sexual Activity   Alcohol use: Yes    Alcohol/week: 2.0 standard drinks of alcohol    Types: 2 Standard drinks or equivalent per week    Comment: Occasional   Drug use: No   Sexual activity: Not on file  Other Topics Concern   Not on file  Social History  Narrative   Not on file   Social Determinants of Health   Financial Resource Strain: Not on file  Food Insecurity: Not on file  Transportation Needs: Not on file  Physical Activity: Not on file  Stress: Not on file  Social Connections: Not on file  Intimate Partner Violence: Not on file    Outpatient Medications Prior to Visit  Medication Sig Dispense Refill   acetaminophen (TYLENOL) 500 MG tablet Take 1 tablet (500 mg total) by mouth every 6 (six) hours as needed. 30 tablet 0   allopurinol (ZYLOPRIM) 300 MG tablet Take 1 tablet (300 mg total) by mouth daily. 30 tablet 2   amLODipine-olmesartan (AZOR) 10-40 MG tablet Take 1 tablet by mouth daily. 30 tablet 2   aspirin EC 81 MG tablet Take 1 tablet (81 mg total) by mouth daily.     busPIRone (BUSPAR) 5 MG tablet Take by mouth.     gabapentin (NEURONTIN) 400 MG capsule Take by mouth.     HYDROcodone-acetaminophen (NORCO) 10-325 MG tablet Take 1 tablet by mouth every 6 (six) hours as needed.     HYDROcodone-acetaminophen (NORCO) 7.5-325 MG tablet One every six hours   as needed for pain.  Seven day limit 18 tablet 0   metoprolol tartrate (LOPRESSOR) 50 MG tablet Take 1 tablet by mouth 2 (two) times daily.  4   naproxen (NAPROSYN) 500 MG tablet Take 1 tablet (500 mg total) by mouth 2 (two) times daily with a meal. 60 tablet 5   oxyCODONE-acetaminophen (PERCOCET/ROXICET) 5-325 MG tablet Take 1 tablet by mouth 4 (four) times daily as needed.     rosuvastatin (CRESTOR) 10 MG tablet Take by mouth.     Facility-Administered Medications Prior to Visit  Medication Dose Route Frequency Provider Last Rate Last Admin   0.9 %  sodium chloride infusion  500 mL Intravenous Continuous Danis, Estill Cotta III, MD        Allergies  Allergen Reactions   Morphine And Related Other (See Comments)    abd pain and inability to urinate    ROS Review of Systems  Constitutional:  Negative for fever.  Respiratory:  Negative for chest tightness and shortness  of breath.   Genitourinary:  Negative for frequency and urgency.      Objective:    Physical Exam HENT:     Head: Normocephalic.  Cardiovascular:     Rate and Rhythm: Normal rate and regular rhythm.     Pulses: Normal pulses.     Heart sounds: Normal heart sounds.  Pulmonary:     Effort: Pulmonary effort is normal.     Breath sounds: Normal breath sounds.  Musculoskeletal:     Cervical back: No rigidity.  Neurological:     Mental Status: He is alert.     BP 133/64   Pulse 62   Ht 6' (1.829 m)   Wt 219 lb 12.8 oz (99.7 kg)   SpO2 98%   BMI 29.81 kg/m  Wt Readings from Last 3 Encounters:  08/23/21 219 lb 12.8 oz (99.7 kg)  08/09/21 216 lb (98 kg)  07/07/21 214 lb (97.1 kg)    Lab Results  Component Value Date   TSH 1.310 07/08/2021   Lab Results  Component Value Date   WBC 8.9 07/08/2021   HGB 16.1 07/08/2021   HCT 46.1 07/08/2021   MCV 88 07/08/2021   PLT 202 07/08/2021   Lab Results  Component Value Date   NA 140 07/08/2021   K 4.7 07/08/2021   CO2 22 07/08/2021   GLUCOSE 125 (H) 07/08/2021   BUN 17 07/08/2021   CREATININE 1.30 (H) 07/08/2021   BILITOT 0.4 07/08/2021   ALKPHOS 63 07/08/2021   AST 24 07/08/2021   ALT 31 07/08/2021   PROT 6.7 07/08/2021   ALBUMIN 4.4 07/08/2021   CALCIUM 9.6 07/08/2021   ANIONGAP 7 10/20/2017   EGFR 61 07/08/2021   Lab Results  Component Value Date   CHOL 99 (L) 07/08/2021   Lab Results  Component Value Date   HDL 40 07/08/2021   Lab Results  Component Value Date   LDLCALC 38 07/08/2021   Lab Results  Component Value Date   TRIG 113 07/08/2021   Lab Results  Component Value Date   CHOLHDL 2.5 07/08/2021   Lab Results  Component Value Date   HGBA1C 5.6 07/08/2021      Assessment & Plan:   Problem List Items Addressed This Visit       Cardiovascular and Mediastinum   Essential hypertension (Chronic)    -Controlled -BP is 133/64 -Reports compliance with the treatment regimen and  adherence to [facility]'s eating plan -Reports exercising regularly -Encouraged to  continue a treatment regimen       No orders of the defined types were placed in this encounter.   Follow-up: Return in about 2 months (around 10/24/2021) for CPE.    Alvira Monday, FNP

## 2021-08-23 NOTE — Assessment & Plan Note (Signed)
-  Controlled -BP is 133/64 -Reports compliance with the treatment regimen and adherence to [facility]'s eating plan -Reports exercising regularly -Encouraged to continue a treatment regimen

## 2021-08-26 ENCOUNTER — Telehealth: Payer: Self-pay | Admitting: Family Medicine

## 2021-08-26 DIAGNOSIS — M25512 Pain in left shoulder: Secondary | ICD-10-CM | POA: Diagnosis not present

## 2021-08-26 DIAGNOSIS — M542 Cervicalgia: Secondary | ICD-10-CM | POA: Diagnosis not present

## 2021-08-26 DIAGNOSIS — M545 Low back pain, unspecified: Secondary | ICD-10-CM | POA: Diagnosis not present

## 2021-08-26 DIAGNOSIS — Z79891 Long term (current) use of opiate analgesic: Secondary | ICD-10-CM | POA: Diagnosis not present

## 2021-08-26 DIAGNOSIS — M25552 Pain in left hip: Secondary | ICD-10-CM | POA: Diagnosis not present

## 2021-08-26 DIAGNOSIS — G5603 Carpal tunnel syndrome, bilateral upper limbs: Secondary | ICD-10-CM | POA: Diagnosis not present

## 2021-08-26 DIAGNOSIS — I10 Essential (primary) hypertension: Secondary | ICD-10-CM | POA: Diagnosis not present

## 2021-08-26 DIAGNOSIS — M25551 Pain in right hip: Secondary | ICD-10-CM | POA: Diagnosis not present

## 2021-08-26 NOTE — Telephone Encounter (Signed)
Handicapped  drivers registration plate from   Noted  Copied  Sleeved   Iin providers box

## 2021-09-08 ENCOUNTER — Ambulatory Visit: Payer: Medicare HMO | Admitting: Orthopedic Surgery

## 2021-09-08 ENCOUNTER — Ambulatory Visit (INDEPENDENT_AMBULATORY_CARE_PROVIDER_SITE_OTHER): Payer: Medicare HMO | Admitting: Orthopedic Surgery

## 2021-09-08 ENCOUNTER — Encounter: Payer: Self-pay | Admitting: Orthopedic Surgery

## 2021-09-08 VITALS — Ht 72.0 in | Wt 219.0 lb

## 2021-09-08 DIAGNOSIS — M7582 Other shoulder lesions, left shoulder: Secondary | ICD-10-CM | POA: Diagnosis not present

## 2021-09-08 DIAGNOSIS — M7581 Other shoulder lesions, right shoulder: Secondary | ICD-10-CM

## 2021-09-08 NOTE — Progress Notes (Signed)
New Patient Visit  Assessment: Stephen Fuller is a 66 y.o. male with the following: 1. Rotator cuff tendinitis, left 2. Rotator cuff tendinitis, right  Plan: KUTLER VANVRANKEN has pain in bilateral shoulders.  He localizes the pain to the anterior aspect of the shoulders.  He has good range of motion, and good strength.  However, increased activity worsens his pain.  He has had excellent response in the past from injections.  He would like to proceed with injections today.  Follow-up as needed.  Procedure note injection - Right shoulder    Verbal consent was obtained to inject the right shoulder, subacromial space Timeout was completed to confirm the site of injection.   The skin was prepped with alcohol and ethyl chloride was sprayed at the injection site.  A 21-gauge needle was used to inject 40 mg of Depo-Medrol and 1% lidocaine (3 cc) into the subacromial space of the right shoulder using a posterolateral approach.  There were no complications.  A sterile bandage was applied.    Procedure note injection Left shoulder    Verbal consent was obtained to inject the left shoulder, subacromial space Timeout was completed to confirm the site of injection.  The skin was prepped with alcohol and ethyl chloride was sprayed at the injection site.  A 21-gauge needle was used to inject 40 mg of Depo-Medrol and 1% lidocaine (3 cc) into the subacromial space of the left shoulder using a posterolateral approach.  There were no complications. A sterile bandage was applied.   Follow-up: No follow-ups on file.  Subjective:  Chief Complaint  Patient presents with   Shoulder Pain    Bilat but L>R has had injections before but current PCP doesn't do them. Would like injections today.     History of Present Illness: Stephen Fuller is a 66 y.o. male who has been referred by  Jorge Mandril, NP for evaluation of bilateral shoulder pain.  He has had pain in both shoulders for several years.  No  specific injury.  Pain is in the anterior aspects of his shoulders.  This does limit his function.  He has had several injections in both shoulders, with excellent relief of his symptoms.  However, the pain does return after a few months.  His prior PCP was comfortable completing these injections, however his new PCP is no longer comfortable.  As result, he has been referred here.  He would like repeat injections.   Review of Systems: No fevers or chills No numbness or tingling No chest pain No shortness of breath No bowel or bladder dysfunction No GI distress No headaches   Medical History:  Past Medical History:  Diagnosis Date   Arthritis    Chest pain    Hepatitis C virus infection cured after antiviral drug therapy    Hypertension    Palpitations     Past Surgical History:  Procedure Laterality Date   CHOLECYSTECTOMY  2005   COLONOSCOPY  2019   LIVER BIOPSY  2019    Family History  Problem Relation Age of Onset   Diabetes Mother    Stroke Mother    Hypertension Mother    Heart disease Unknown        No family history   Diabetes Brother    Cirrhosis Brother    Colon cancer Neg Hx    Social History   Tobacco Use   Smoking status: Every Day    Packs/day: 1.00    Years: 37.00  Total pack years: 37.00    Types: Cigarettes   Smokeless tobacco: Never  Vaping Use   Vaping Use: Never used  Substance Use Topics   Alcohol use: Yes    Alcohol/week: 2.0 standard drinks of alcohol    Types: 2 Standard drinks or equivalent per week    Comment: Occasional   Drug use: No    Allergies  Allergen Reactions   Morphine And Related Other (See Comments)    abd pain and inability to urinate    No outpatient medications have been marked as taking for the 09/08/21 encounter (Office Visit) with Oliver Barre, MD.   Current Facility-Administered Medications for the 09/08/21 encounter (Office Visit) with Oliver Barre, MD  Medication   0.9 %  sodium chloride infusion     Objective: Ht 6' (1.829 m)   Wt 219 lb (99.3 kg)   BMI 29.70 kg/m   Physical Exam:  General: Alert and oriented. and No acute distress. Gait: Normal gait.  Bilateral shoulders without deformity.  No swelling.  No atrophy.  Tenderness palpation of the anterior shoulder.  Near full range of motion, with pain in the anterior aspect of shoulders.  He has good strength.  Fingers warm and well-perfused.  2+ radial pulse.   IMAGING: I personally reviewed images previously obtained in clinic  X-rays of bilateral shoulders were previously obtained.  No acute injuries are noted.  Moderate degenerative changes in the [facility] joint.  Glenohumeral joint space is maintained.  No evidence of proximal humeral migration.   New Medications:  No orders of the defined types were placed in this encounter.     Oliver Barre, MD  09/08/2021 1:52 PM

## 2021-09-09 ENCOUNTER — Encounter: Payer: Self-pay | Admitting: Orthopedic Surgery

## 2021-09-27 ENCOUNTER — Ambulatory Visit (INDEPENDENT_AMBULATORY_CARE_PROVIDER_SITE_OTHER): Payer: Medicare HMO | Admitting: Family Medicine

## 2021-09-27 ENCOUNTER — Encounter: Payer: Self-pay | Admitting: Family Medicine

## 2021-09-27 VITALS — BP 142/80 | HR 61 | Ht 72.0 in | Wt 218.0 lb

## 2021-09-27 DIAGNOSIS — Z23 Encounter for immunization: Secondary | ICD-10-CM

## 2021-09-27 DIAGNOSIS — Z0000 Encounter for general adult medical examination without abnormal findings: Secondary | ICD-10-CM | POA: Diagnosis not present

## 2021-09-27 NOTE — Patient Instructions (Addendum)
I appreciate the opportunity to provide care to you today!    Follow up: 10/25/21 for your CPE   Please continue to a heart-healthy diet and increase your physical activities. Try to exercise for at least three times a week.      It was a pleasure to see you and I look forward to continuing to work together on your health and well-being. Please do not hesitate to call the office if you need care or have questions about your care.   Have a wonderful day and week. With Gratitude, [name] MSN, FNP-BC

## 2021-09-27 NOTE — Progress Notes (Signed)
Subjective:   Stephen Fuller is a 66 y.o. male who presents for a Welcome to Medicare exam.   Review of Systems:        Objective:    Today's Vitals   09/27/21 0815 09/27/21 0830  BP: (!) 150/69 (!) 142/80  Pulse: 61   SpO2: 95%   Weight: 218 lb (98.9 kg)   Height: 6' (1.829 m)   PainSc: 7     Body mass index is 29.57 kg/m.  Medications Outpatient Encounter Medications as of 09/27/2021  Medication Sig   acetaminophen (TYLENOL) 500 MG tablet Take 1 tablet (500 mg total) by mouth every 6 (six) hours as needed.   allopurinol (ZYLOPRIM) 300 MG tablet Take 1 tablet (300 mg total) by mouth daily.   amLODipine-olmesartan (AZOR) 10-40 MG tablet Take 1 tablet by mouth daily.   aspirin EC 81 MG tablet Take 1 tablet (81 mg total) by mouth daily.   busPIRone (BUSPAR) 5 MG tablet Take by mouth.   gabapentin (NEURONTIN) 400 MG capsule Take by mouth.   HYDROcodone-acetaminophen (NORCO) 10-325 MG tablet Take 1 tablet by mouth every 6 (six) hours as needed.   HYDROcodone-acetaminophen (NORCO) 7.5-325 MG tablet One every six hours as needed for pain.  Seven day limit   metoprolol tartrate (LOPRESSOR) 50 MG tablet Take 1 tablet by mouth 2 (two) times daily.   naproxen (NAPROSYN) 500 MG tablet Take 1 tablet (500 mg total) by mouth 2 (two) times daily with a meal.   oxyCODONE-acetaminophen (PERCOCET/ROXICET) 5-325 MG tablet Take 1 tablet by mouth 4 (four) times daily as needed.   rosuvastatin (CRESTOR) 10 MG tablet Take by mouth.   Facility-Administered Encounter Medications as of 09/27/2021  Medication   0.9 %  sodium chloride infusion     History: Past Medical History:  Diagnosis Date   Arthritis    Chest pain    Hepatitis C virus infection cured after antiviral drug therapy    Hypertension    Palpitations    Past Surgical History:  Procedure Laterality Date   CHOLECYSTECTOMY  2005   COLONOSCOPY  2019   LIVER BIOPSY  2019    Family History  Problem Relation Age of Onset    Diabetes Mother    Stroke Mother    Hypertension Mother    Heart disease Unknown        No family history   Diabetes Brother    Cirrhosis Brother    Colon cancer Neg Hx    Social History   Occupational History   Occupation: UNEMPLOYED  Tobacco Use   Smoking status: Every Day    Packs/day: 0.50    Years: 37.00    Total pack years: 18.50    Types: Cigarettes   Smokeless tobacco: Never  Vaping Use   Vaping Use: Never used  Substance and Sexual Activity   Alcohol use: Yes    Alcohol/week: 2.0 standard drinks of alcohol    Types: 2 Standard drinks or equivalent per week    Comment: Occasional   Drug use: No   Sexual activity: Not on file   Tobacco Counseling Ready to quit: No Counseling given: Yes   Immunizations and Health Maintenance Immunization History  Administered Date(s) Administered   Tdap 07/26/2021   Zoster Recombinat (Shingrix) 07/26/2021   Health Maintenance Due  Topic Date Due   COVID-19 Vaccine (1) Never done   Pneumonia Vaccine 69+ Years old (1 - PCV) Never done   Zoster Vaccines- Shingrix (2 of 2)  09/20/2021   INFLUENZA VACCINE  09/14/2021    Activities of Daily Living    09/27/2021    8:28 AM  In your present state of health, do you have any difficulty performing the following activities:  Hearing? 0  Vision? 0  Difficulty concentrating or making decisions? 0  Walking or climbing stairs? 0  Dressing or bathing? 0  Doing errands, shopping? 0    Physical Exam  (optional), or other factors deemed appropriate based on the beneficiary's medical and social history and current clinical standards.  Advanced Directives: Does Patient Have a Medical Advance Directive?: No Would patient like information on creating a medical advance directive?: No - Patient declined    Assessment:    This is a routine wellness  examination for this patient .   Vision/Hearing screen Vision Screening   Right eye Left eye Both eyes  Without correction 20/20 20/20  20/20  With correction       Dietary issues and exercise activities discussed:      Goals      Blood Pressure < 140/90     Reduce blood pressure work on his health.         Depression Screen    09/27/2021    8:19 AM 08/23/2021    8:18 AM 08/09/2021    8:11 AM 07/07/2021    8:14 AM  PHQ 2/9 Scores  PHQ - 2 Score 2 1 0 4  PHQ- 9 Score 3   9     Fall Risk    09/27/2021    8:27 AM  Fall Risk   Falls in the past year? 0  Number falls in past yr: 0  Injury with Fall? 0  Risk for fall due to : No Fall Risks  Follow up Falls evaluation completed    Cognitive Function        Patient Care Team: [name], FNP as PCP - General (Family Medicine) Laqueta Linden, MD (Inactive) as Attending Physician (Cardiology)     Plan:   Elevated BP - Pt reports being in pain,which elevates his BP -Pain is rated 7/10 in his back and hips -Reports taking BP and pain medicine this morning -Patient is following up with pain management for chronic pain  AWV No complaints or concerns verbalized Follow-up in 1 year  I have personally reviewed and noted the following in the patient's chart:   Medical and social history Use of alcohol, tobacco or illicit drugs  Current medications and supplements Functional ability and status Nutritional status Physical activity Advanced directives List of other physicians Hospitalizations, surgeries, and ER visits in previous 12 months Vitals Screenings to include cognitive, depression, and falls Referrals and appointments  In addition, I have reviewed and discussed with patient certain preventive protocols, quality metrics, and best practice recommendations. A written personalized care plan for preventive services as well as general preventive health recommendations were provided to patient.    Herbie Saxon, [place] 09/27/2021

## 2021-09-28 ENCOUNTER — Telehealth: Payer: Self-pay | Admitting: Family Medicine

## 2021-09-28 ENCOUNTER — Other Ambulatory Visit: Payer: Self-pay

## 2021-09-28 DIAGNOSIS — I10 Essential (primary) hypertension: Secondary | ICD-10-CM

## 2021-09-28 MED ORDER — AMLODIPINE-OLMESARTAN 10-40 MG PO TABS: 1.0000 | ORAL_TABLET | Freq: Every day | ORAL | 3 refills | Status: DC

## 2021-09-28 NOTE — Telephone Encounter (Signed)
Patient needs refill on  amLODipine-olmesartan (AZOR) 10-40 MG tablet   Patient med will run out today

## 2021-09-28 NOTE — Telephone Encounter (Signed)
Refill sent, pt informed

## 2021-10-21 DIAGNOSIS — G5603 Carpal tunnel syndrome, bilateral upper limbs: Secondary | ICD-10-CM | POA: Diagnosis not present

## 2021-10-21 DIAGNOSIS — M25551 Pain in right hip: Secondary | ICD-10-CM | POA: Diagnosis not present

## 2021-10-21 DIAGNOSIS — Z79891 Long term (current) use of opiate analgesic: Secondary | ICD-10-CM | POA: Diagnosis not present

## 2021-10-21 DIAGNOSIS — M542 Cervicalgia: Secondary | ICD-10-CM | POA: Diagnosis not present

## 2021-10-21 DIAGNOSIS — I10 Essential (primary) hypertension: Secondary | ICD-10-CM | POA: Diagnosis not present

## 2021-10-21 DIAGNOSIS — M545 Low back pain, unspecified: Secondary | ICD-10-CM | POA: Diagnosis not present

## 2021-10-21 DIAGNOSIS — M25512 Pain in left shoulder: Secondary | ICD-10-CM | POA: Diagnosis not present

## 2021-10-21 DIAGNOSIS — M25552 Pain in left hip: Secondary | ICD-10-CM | POA: Diagnosis not present

## 2021-10-25 ENCOUNTER — Telehealth: Payer: Self-pay | Admitting: Family Medicine

## 2021-10-25 ENCOUNTER — Encounter: Payer: Self-pay | Admitting: Family Medicine

## 2021-10-25 ENCOUNTER — Ambulatory Visit (INDEPENDENT_AMBULATORY_CARE_PROVIDER_SITE_OTHER): Payer: Medicare HMO | Admitting: Family Medicine

## 2021-10-25 VITALS — BP 152/82 | HR 60 | Ht 72.0 in | Wt 216.1 lb

## 2021-10-25 DIAGNOSIS — R7301 Impaired fasting glucose: Secondary | ICD-10-CM

## 2021-10-25 DIAGNOSIS — Z2821 Immunization not carried out because of patient refusal: Secondary | ICD-10-CM

## 2021-10-25 DIAGNOSIS — I10 Essential (primary) hypertension: Secondary | ICD-10-CM | POA: Diagnosis not present

## 2021-10-25 DIAGNOSIS — Z0001 Encounter for general adult medical examination with abnormal findings: Secondary | ICD-10-CM | POA: Diagnosis not present

## 2021-10-25 DIAGNOSIS — N528 Other male erectile dysfunction: Secondary | ICD-10-CM

## 2021-10-25 DIAGNOSIS — N529 Male erectile dysfunction, unspecified: Secondary | ICD-10-CM | POA: Diagnosis not present

## 2021-10-25 DIAGNOSIS — M10071 Idiopathic gout, right ankle and foot: Secondary | ICD-10-CM

## 2021-10-25 DIAGNOSIS — E559 Vitamin D deficiency, unspecified: Secondary | ICD-10-CM | POA: Diagnosis not present

## 2021-10-25 DIAGNOSIS — M549 Dorsalgia, unspecified: Secondary | ICD-10-CM | POA: Insufficient documentation

## 2021-10-25 DIAGNOSIS — Z23 Encounter for immunization: Secondary | ICD-10-CM

## 2021-10-25 MED ORDER — OLMESARTAN-AMLODIPINE-HCTZ 40-10-12.5 MG PO TABS: 1.0000 | ORAL_TABLET | Freq: Every day | ORAL | 1 refills | Status: DC

## 2021-10-25 MED ORDER — TADALAFIL 10 MG PO TABS: 10.0000 mg | ORAL_TABLET | ORAL | 1 refills | Status: DC | PRN

## 2021-10-25 MED ORDER — ALLOPURINOL 300 MG PO TABS: 300.0000 mg | ORAL_TABLET | Freq: Every day | ORAL | 1 refills | Status: DC

## 2021-10-25 NOTE — Telephone Encounter (Signed)
Please inform the patient to use his goodrx card

## 2021-10-25 NOTE — Telephone Encounter (Signed)
Spoke to pt states he will check with his other pharmacy will call us if we need to send a new rx.

## 2021-10-25 NOTE — Assessment & Plan Note (Signed)
Cialis ordered Pt reported that his insurance does not cover the medication, and he often pays out-of-pocket Provided patient with GoodRX coupon card

## 2021-10-25 NOTE — Assessment & Plan Note (Signed)
Physical exam as documented Counseling is done on healthy lifestyle involving commitment to 150 minutes of exercise per week,  Discussed heart-healthy diet and attaining a healthy weight Changes in health habits are decided on by the patient with goals and time frames set for achieving them. Immunization and cancer screening needs are specifically addressed at this visit He received the prevnar 20 today

## 2021-10-25 NOTE — Progress Notes (Addendum)
Established Patient Office Visit  Subjective:  Patient ID: Stephen Fuller, male    DOB: Mar 02, 1955  Age: 66 y.o. MRN: [id number]  CC:  Chief Complaint  Patient presents with   Annual Exam    Pt here for CPE, pt reports taking bp at home readings have been well. Needs a referral for neurologist since Dr. Merlene Laughter is leaving last appt with him is in November.     HPI Stephen Fuller is a 66 y.o. male with past medical history of essential hypertension, tobacco use, SOB presents for a complete physical exam.  HTN: not controlled. Reports adherence to the treatment regimen and reducing his salt intake. He reports exercising regularly and denies headaches, dizziness, and blurred vision.   Erectile dysfunction: reports inability to acquire or sustain an erection of sufficient rigidity and duration for sexual intercourse. He would like to start on Cialis.         Past Medical History:  Diagnosis Date   Arthritis    Chest pain    Hepatitis C virus infection cured after antiviral drug therapy    Hypertension    Palpitations     Past Surgical History:  Procedure Laterality Date   CHOLECYSTECTOMY  2005   COLONOSCOPY  2019   LIVER BIOPSY  2019    Family History  Problem Relation Age of Onset   Diabetes Mother    Stroke Mother    Hypertension Mother    Heart disease Unknown        No family history   Diabetes Brother    Cirrhosis Brother    Colon cancer Neg Hx     Social History   Socioeconomic History   Marital status: Married    Spouse name: Not on file   Number of children: 2   Years of education: Not on file   Highest education level: Not on file  Occupational History   Occupation: UNEMPLOYED  Tobacco Use   Smoking status: Every Day    Packs/day: 0.50    Years: 37.00    Total pack years: 18.50    Types: Cigarettes   Smokeless tobacco: Never  Vaping Use   Vaping Use: Never used  Substance and Sexual Activity   Alcohol use: Yes    Alcohol/week: 2.0  standard drinks of alcohol    Types: 2 Standard drinks or equivalent per week    Comment: Occasional   Drug use: No   Sexual activity: Not on file  Other Topics Concern   Not on file  Social History Narrative   Not on file   Social Determinants of Health   Financial Resource Strain: Low Risk  (09/27/2021)   Overall Financial Resource Strain (CARDIA)    Difficulty of Paying Living Expenses: Not hard at all  Food Insecurity: No Food Insecurity (09/27/2021)   Hunger Vital Sign    Worried About Running Out of Food in the Last Year: Never true    Ran Out of Food in the Last Year: Never true  Transportation Needs: No Transportation Needs (09/27/2021)   PRAPARE - Hydrologist (Medical): No    Lack of Transportation (Non-Medical): No  Physical Activity: Sufficiently Active (09/27/2021)   Exercise Vital Sign    Days of Exercise per Week: 5 days    Minutes of Exercise per Session: 30 min  Stress: No Stress Concern Present (09/27/2021)   [place]    Feeling of Stress :  Not at all  Social Connections: Moderately Isolated (09/27/2021)   Social Connection and Isolation Panel [NHANES]    Frequency of Communication with Friends and Family: Twice a week    Frequency of Social Gatherings with Friends and Family: Once a week    Attends Religious Services: Never    Marine scientist or Organizations: No    Attends Archivist Meetings: Never    Marital Status: Married  Human resources officer Violence: Not At Risk (09/27/2021)   Humiliation, Afraid, Rape, and Kick questionnaire    Fear of Current or Ex-Partner: No    Emotionally Abused: No    Physically Abused: No    Sexually Abused: No    Outpatient Medications Prior to Visit  Medication Sig Dispense Refill   aspirin EC 81 MG tablet Take 1 tablet (81 mg total) by mouth daily.     busPIRone (BUSPAR) 5 MG tablet Take by mouth.     gabapentin  (NEURONTIN) 400 MG capsule Take by mouth.     HYDROcodone-acetaminophen (NORCO) 7.5-325 MG tablet One every six hours as needed for pain.  Seven day limit 18 tablet 0   metoprolol tartrate (LOPRESSOR) 50 MG tablet Take 1 tablet by mouth 2 (two) times daily.  4   rosuvastatin (CRESTOR) 10 MG tablet Take by mouth.     acetaminophen (TYLENOL) 500 MG tablet Take 1 tablet (500 mg total) by mouth every 6 (six) hours as needed. 30 tablet 0   allopurinol (ZYLOPRIM) 300 MG tablet Take 1 tablet (300 mg total) by mouth daily. 30 tablet 2   amLODipine-olmesartan (AZOR) 10-40 MG tablet Take 1 tablet by mouth daily. 30 tablet 3   HYDROcodone-acetaminophen (NORCO) 10-325 MG tablet Take 1 tablet by mouth every 6 (six) hours as needed.     naproxen (NAPROSYN) 500 MG tablet Take 1 tablet (500 mg total) by mouth 2 (two) times daily with a meal. 60 tablet 5   oxyCODONE-acetaminophen (PERCOCET/ROXICET) 5-325 MG tablet Take 1 tablet by mouth 4 (four) times daily as needed.     Facility-Administered Medications Prior to Visit  Medication Dose Route Frequency Provider Last Rate Last Admin   0.9 %  sodium chloride infusion  500 mL Intravenous Continuous Danis, Estill Cotta III, MD        Allergies  Allergen Reactions   Morphine And Related Other (See Comments)    abd pain and inability to urinate    ROS Review of Systems  Constitutional:  Negative for fatigue and fever.  Eyes:  Negative for pain and visual disturbance.  Respiratory:  Negative for chest tightness and shortness of breath.   Cardiovascular:  Negative for chest pain and palpitations.  Neurological:  Negative for dizziness, weakness and headaches.      Objective:    Physical Exam Cardiovascular:     Rate and Rhythm: Normal rate and regular rhythm.     Pulses: Normal pulses.     Heart sounds: Normal heart sounds.  Pulmonary:     Effort: Pulmonary effort is normal.     Breath sounds: Normal breath sounds.  Musculoskeletal:     Cervical back:  No rigidity.  Neurological:     Mental Status: He is alert.     BP (!) 152/82 (BP Location: Right Arm)   Pulse 60   Ht 6' (1.829 m)   Wt 216 lb 1.3 oz (98 kg)   SpO2 95%   BMI 29.31 kg/m  Wt Readings from Last 3 Encounters:  10/25/21 216  lb 1.3 oz (98 kg)  09/27/21 218 lb (98.9 kg)  09/08/21 219 lb (99.3 kg)    Lab Results  Component Value Date   TSH 1.310 07/08/2021   Lab Results  Component Value Date   WBC 8.9 07/08/2021   HGB 16.1 07/08/2021   HCT 46.1 07/08/2021   MCV 88 07/08/2021   PLT 202 07/08/2021   Lab Results  Component Value Date   NA 140 07/08/2021   K 4.7 07/08/2021   CO2 22 07/08/2021   GLUCOSE 125 (H) 07/08/2021   BUN 17 07/08/2021   CREATININE 1.30 (H) 07/08/2021   BILITOT 0.4 07/08/2021   ALKPHOS 63 07/08/2021   AST 24 07/08/2021   ALT 31 07/08/2021   PROT 6.7 07/08/2021   ALBUMIN 4.4 07/08/2021   CALCIUM 9.6 07/08/2021   ANIONGAP 7 10/20/2017   EGFR 61 07/08/2021   Lab Results  Component Value Date   CHOL 99 (L) 07/08/2021   Lab Results  Component Value Date   HDL 40 07/08/2021   Lab Results  Component Value Date   LDLCALC 38 07/08/2021   Lab Results  Component Value Date   TRIG 113 07/08/2021   Lab Results  Component Value Date   CHOLHDL 2.5 07/08/2021   Lab Results  Component Value Date   HGBA1C 5.6 07/08/2021      Assessment & Plan:   Problem List Items Addressed This Visit       Cardiovascular and Mediastinum   Essential hypertension (Chronic)    BP Readings from Last 3 Encounters:  10/25/21 (!) 152/82  09/27/21 (!) 142/80  08/23/21 133/64  Uncontrolled He denies headaches, dizziness and blurred vision Reports adherence to treatment regimen Will start patient on olmarsartan- amlodipine-HCTZ 40-10-12.5 F/u in 1 monthn      Relevant Medications   tadalafil (CIALIS) 10 MG tablet   Olmesartan-amLODIPine-HCTZ 40-10-12.5 MG TABS     Other   Encounter for general adult medical examination with abnormal  findings    Physical exam as documented Counseling is done on healthy lifestyle involving commitment to 150 minutes of exercise per week,  Discussed heart-healthy diet and attaining a healthy weight Changes in health habits are decided on by the patient with goals and time frames set for achieving them. Immunization and cancer screening needs are specifically addressed at this visit He received the prevnar 20 today      Erectile dysfunction - Primary    Cialis ordered Pt reported that his insurance does not cover the medication, and he often pays out-of-pocket Provided patient with [facility] coupon card      Relevant Medications   tadalafil (CIALIS) 10 MG tablet   Dorsalgia    He reports following up with Dr.Doonquah with his last appt in November He reports that he would like a referral to another neurologist in [place], given that Dr.Doonquah would no longer be providing him with services      Relevant Orders   Ambulatory referral to Neurology   Other Visit Diagnoses     Idiopathic gout of right ankle, unspecified chronicity       Relevant Medications   allopurinol (ZYLOPRIM) 300 MG tablet   Influenza vaccine refused       IFG (impaired fasting glucose)       Relevant Orders   CBC with Differential/Platelet   CMP14+EGFR   TSH + free T4   Hemoglobin A1C   Lipid Profile   Vitamin D deficiency       Relevant Orders  Vitamin D (25 hydroxy)   Immunization due       Relevant Orders   Pneumococcal conjugate vaccine 20-valent (Completed)       Meds ordered this encounter  Medications   allopurinol (ZYLOPRIM) 300 MG tablet    Sig: Take 1 tablet (300 mg total) by mouth daily.    Dispense:  90 tablet    Refill:  1   tadalafil (CIALIS) 10 MG tablet    Sig: Take 1 tablet (10 mg total) by mouth every other day as needed for erectile dysfunction.    Dispense:  20 tablet    Refill:  1   Olmesartan-amLODIPine-HCTZ 40-10-12.5 MG TABS    Sig: Take 1 tablet by mouth daily.     Dispense:  30 tablet    Refill:  1    Follow-up: Return in about 1 month (around 11/24/2021) for BP.    Alvira Monday, FNP

## 2021-10-25 NOTE — Assessment & Plan Note (Addendum)
He reports following up with Dr.Doonquah with his last appt in November He reports that he would like a referral to another neurologist in [place], given that Dr.Doonquah would no longer be providing him with services

## 2021-10-25 NOTE — Patient Instructions (Addendum)
I appreciate the opportunity to provide care to you today!    Follow up:  1 months  Labs: please stop by the lab during the week your blood drawn (CBC, CMP, TSH, Lipid profile, HgA1c, Vit D)   Please pick up your medications at the pharmacy    Please stop by your local pharmacy and get your Tdap and Shingles vaccine  Referrals today- Neurology   Please continue to a heart-healthy diet and increase your physical activities. Try to exercise for at least three times a week.      It was a pleasure to see you and I look forward to continuing to work together on your health and well-being. Please do not hesitate to call the office if you need care or have questions about your care.   Have a wonderful day and week. With Gratitude, [name] MSN, FNP-BC

## 2021-10-25 NOTE — Assessment & Plan Note (Signed)
BP Readings from Last 3 Encounters:  10/25/21 (!) 152/82  09/27/21 (!) 142/80  08/23/21 133/64  Uncontrolled He denies headaches, dizziness and blurred vision Reports adherence to treatment regimen Will start patient on olmarsartan- amlodipine-HCTZ 40-10-12.5 F/u in 1 monthn

## 2021-10-25 NOTE — Telephone Encounter (Signed)
Pt called asking for clarification on the medication change from the appt today. Can you please give him a call?

## 2021-10-25 NOTE — Telephone Encounter (Signed)
Spoke to pt let him know his is to only be on the combined bp with hydrochlorothiazide pt stated understanding, states Cialis was to expensive it would cost him 450 for 20 tablets.

## 2021-10-26 DIAGNOSIS — E559 Vitamin D deficiency, unspecified: Secondary | ICD-10-CM | POA: Diagnosis not present

## 2021-10-26 DIAGNOSIS — R7301 Impaired fasting glucose: Secondary | ICD-10-CM | POA: Diagnosis not present

## 2021-10-27 LAB — CBC WITH DIFFERENTIAL/PLATELET
Basophils Absolute: 0.1 10*3/uL (ref 0.0–0.2)
Basos: 0 %
EOS (ABSOLUTE): 0.3 10*3/uL (ref 0.0–0.4)
Eos: 2 %
Hematocrit: 47.7 % (ref 37.5–51.0)
Hemoglobin: 16.4 g/dL (ref 13.0–17.7)
Immature Grans (Abs): 0 10*3/uL (ref 0.0–0.1)
Immature Granulocytes: 0 %
Lymphocytes Absolute: 2.9 10*3/uL (ref 0.7–3.1)
Lymphs: 25 %
MCH: 31.2 pg (ref 26.6–33.0)
MCHC: 34.4 g/dL (ref 31.5–35.7)
MCV: 91 fL (ref 79–97)
Monocytes Absolute: 1 10*3/uL — ABNORMAL HIGH (ref 0.1–0.9)
Monocytes: 8 %
Neutrophils Absolute: 7.5 10*3/uL — ABNORMAL HIGH (ref 1.4–7.0)
Neutrophils: 65 %
Platelets: 180 10*3/uL (ref 150–450)
RBC: 5.26 x10E6/uL (ref 4.14–5.80)
RDW: 13.2 % (ref 11.6–15.4)
WBC: 11.8 10*3/uL — ABNORMAL HIGH (ref 3.4–10.8)

## 2021-10-27 LAB — CMP14+EGFR
ALT: 35 IU/L (ref 0–44)
AST: 27 IU/L (ref 0–40)
Albumin/Globulin Ratio: 2 (ref 1.2–2.2)
Albumin: 4.8 g/dL (ref 3.9–4.9)
Alkaline Phosphatase: 89 IU/L (ref 44–121)
BUN/Creatinine Ratio: 12 (ref 10–24)
BUN: 16 mg/dL (ref 8–27)
Bilirubin Total: 0.7 mg/dL (ref 0.0–1.2)
CO2: 22 mmol/L (ref 20–29)
Calcium: 9.7 mg/dL (ref 8.6–10.2)
Chloride: 102 mmol/L (ref 96–106)
Creatinine, Ser: 1.29 mg/dL — ABNORMAL HIGH (ref 0.76–1.27)
Globulin, Total: 2.4 g/dL (ref 1.5–4.5)
Glucose: 98 mg/dL (ref 70–99)
Potassium: 4.5 mmol/L (ref 3.5–5.2)
Sodium: 140 mmol/L (ref 134–144)
Total Protein: 7.2 g/dL (ref 6.0–8.5)
eGFR: 62 mL/min/{1.73_m2} (ref >59)

## 2021-10-27 LAB — LIPID PANEL
Chol/HDL Ratio: 1.9 ratio (ref 0.0–5.0)
Cholesterol, Total: 85 mg/dL — ABNORMAL LOW (ref 100–199)
HDL: 45 mg/dL (ref >39)
LDL Chol Calc (NIH): 22 mg/dL (ref 0–99)
Triglycerides: 95 mg/dL (ref 0–149)
VLDL Cholesterol Cal: 18 mg/dL (ref 5–40)

## 2021-10-27 LAB — VITAMIN D 25 HYDROXY (VIT D DEFICIENCY, FRACTURES): Vit D, 25-Hydroxy: 50.1 ng/mL (ref 30.0–100.0)

## 2021-10-27 LAB — HEMOGLOBIN A1C
Est. average glucose Bld gHb Est-mCnc: 123 mg/dL
Hgb A1c MFr Bld: 5.9 % — ABNORMAL HIGH (ref 4.8–5.6)

## 2021-10-27 LAB — TSH+FREE T4
Free T4: 1.42 ng/dL (ref 0.82–1.77)
TSH: 1.7 u[IU]/mL (ref 0.450–4.500)

## 2021-11-01 NOTE — Progress Notes (Signed)
Please inform the patient to increase his fluid intake, as he is prediabetic. I recommend reducing his intake of sugary foods.

## 2021-11-02 ENCOUNTER — Telehealth: Payer: Self-pay

## 2021-11-02 NOTE — Progress Notes (Signed)
noted 

## 2021-11-02 NOTE — Telephone Encounter (Signed)
Calling lab results

## 2021-11-02 NOTE — Telephone Encounter (Signed)
Spoke to pt

## 2021-11-16 ENCOUNTER — Telehealth: Payer: Self-pay | Admitting: Family Medicine

## 2021-11-16 ENCOUNTER — Other Ambulatory Visit: Payer: Self-pay

## 2021-11-16 ENCOUNTER — Telehealth: Payer: Self-pay

## 2021-11-16 DIAGNOSIS — H7202 Central perforation of tympanic membrane, left ear: Secondary | ICD-10-CM

## 2021-11-16 DIAGNOSIS — M549 Dorsalgia, unspecified: Secondary | ICD-10-CM

## 2021-11-16 NOTE — Telephone Encounter (Signed)
Pt has been informed of new referral.

## 2021-11-16 NOTE — Telephone Encounter (Signed)
Please refer him to [facility] in [place]

## 2021-11-16 NOTE — Telephone Encounter (Signed)
[place] called from [place] [facility] Provider reviewed referral. Provider declined to see patient.

## 2021-11-16 NOTE — Telephone Encounter (Signed)
Patient return call   Also wants new neuro referral placed, current office is closing.

## 2021-11-17 ENCOUNTER — Other Ambulatory Visit: Payer: Self-pay

## 2021-11-17 DIAGNOSIS — M549 Dorsalgia, unspecified: Secondary | ICD-10-CM

## 2021-11-17 NOTE — Telephone Encounter (Signed)
New referral has been put in.  

## 2021-11-24 ENCOUNTER — Ambulatory Visit (INDEPENDENT_AMBULATORY_CARE_PROVIDER_SITE_OTHER): Payer: Medicare HMO | Admitting: Internal Medicine

## 2021-11-24 ENCOUNTER — Encounter: Payer: Self-pay | Admitting: Internal Medicine

## 2021-11-24 VITALS — BP 159/70 | HR 67 | Resp 16 | Ht 72.0 in | Wt 221.4 lb

## 2021-11-24 DIAGNOSIS — I10 Essential (primary) hypertension: Secondary | ICD-10-CM

## 2021-11-24 MED ORDER — OLMESARTAN-AMLODIPINE-HCTZ 40-10-25 MG PO TABS: 1.0000 | ORAL_TABLET | Freq: Every day | ORAL | 1 refills | Status: DC

## 2021-11-24 NOTE — Progress Notes (Signed)
     CC: High blood pressure   HPI:Stephen Fuller is a 66 y.o. male who presents for evaluation of high blood pressure. For the details of today's visit, please refer to the assessment and plan.  Past Medical History:  Diagnosis Date   Arthritis    Chest pain    Hepatitis C virus infection cured after antiviral drug therapy    Hypertension    Palpitations     Review of Systems:    Review of Systems  Cardiovascular:  Negative for leg swelling.  Neurological:  Negative for dizziness and headaches.     Physical Exam: Vitals:   11/24/21 1304  BP: (!) 159/70  Pulse: 67  Resp: 16  SpO2: 92%  Weight: 221 lb 6.4 oz (100.4 kg)  Height: 6' (1.829 m)     Physical Exam Constitutional:      General: He is not in acute distress.    Appearance: Normal appearance. He is obese.  Cardiovascular:     Rate and Rhythm: Normal rate and regular rhythm.     Heart sounds: No murmur heard. Pulmonary:     Effort: Pulmonary effort is normal.     Breath sounds: Normal breath sounds.  Musculoskeletal:     Right lower leg: No edema.     Left lower leg: No edema.  Neurological:     Mental Status: He is alert.      Assessment & Plan:   Essential hypertension Hypertension: Patient's BP today is 159/70 with a goal of <140/80. The patient endorses adherence to his medication regimen. He is taking metoprolol 50 mg BID. Reports not feeling well since starting this medication 5 years ago. Notes showed started for HTN and palpitations.  Has lightheaded feeling sometimes, no specific triggers and not when going from sitting to standing.   Assessment/Plan: Essential HTN, uncontrolled. Plan to stop metoprolol and see if this helps him feel better. Increase HCTZ dose in combination pill. He will start this in a week, when his new pill pack is sent to him. He will keep a blood pressure log for 7 days. Return for further adjustment to his antihypertensive regimen.  - Basic metabolic panel -  Olmesartan-amLODIPine-HCTZ 40-10-25 MG TABS; Take 1 tablet by mouth daily at 6 (six) AM.  Dispense: 30 tablet; Refill: 1      Lorene Dy, MD

## 2021-11-24 NOTE — Assessment & Plan Note (Signed)
Hypertension: Patient's BP today is 159/70 with a goal of <140/80. The patient endorses adherence to his medication regimen. He is taking metoprolol 50 mg BID. Reports not feeling well since starting this medication 5 years ago. Notes showed started for HTN and palpitations.  Has lightheaded feeling sometimes, no specific triggers and not when going from sitting to standing.   Assessment/Plan: Essential HTN, uncontrolled. Plan to stop metoprolol and see if this helps him feel better. Increase HCTZ dose in combination pill. He will start this in a week, when his new pill pack is sent to him. He will keep a blood pressure log for 7 days. Return for further adjustment to his antihypertensive regimen.  - Basic metabolic panel - Olmesartan-amLODIPine-HCTZ 40-10-25 MG TABS; Take 1 tablet by mouth daily at 6 (six) AM.  Dispense: 30 tablet; Refill: 1

## 2021-11-24 NOTE — Patient Instructions (Addendum)
Thank you for trusting me with your care. To recap, today we discussed the following:  Essential hypertension - Basic metabolic panel - Olmesartan-amLODIPine-HCTZ 40-10-25 MG TABS; Take 1 tablet by mouth daily at 6 (six) AM.  Dispense: 30 tablet; Refill: 1  When you start taking the new prescription, keep a blood pressure log. Take your blood pressure in the morning and the evening for 7 days. Make a follow up appointment in approximately 2 -4 weeks after starting the new medication.   Call office if your top number is above [phone]. Goal is top number 140 or less.

## 2021-11-25 LAB — BASIC METABOLIC PANEL
BUN/Creatinine Ratio: 18 (ref 10–24)
BUN: 24 mg/dL (ref 8–27)
CO2: 24 mmol/L (ref 20–29)
Calcium: 9.9 mg/dL (ref 8.6–10.2)
Chloride: 102 mmol/L (ref 96–106)
Creatinine, Ser: 1.31 mg/dL — ABNORMAL HIGH (ref 0.76–1.27)
Glucose: 83 mg/dL (ref 70–99)
Potassium: 3.8 mmol/L (ref 3.5–5.2)
Sodium: 143 mmol/L (ref 134–144)
eGFR: 60 mL/min/{1.73_m2} (ref >59)

## 2021-11-30 ENCOUNTER — Ambulatory Visit (INDEPENDENT_AMBULATORY_CARE_PROVIDER_SITE_OTHER): Payer: Medicare HMO | Admitting: Family Medicine

## 2021-11-30 DIAGNOSIS — F17210 Nicotine dependence, cigarettes, uncomplicated: Secondary | ICD-10-CM

## 2021-11-30 DIAGNOSIS — I10 Essential (primary) hypertension: Secondary | ICD-10-CM

## 2021-11-30 NOTE — Progress Notes (Signed)
Virtual Visit via Telephone Note   This visit type was conducted via telephone. This format is felt to be most appropriate for this patient at this time.  The patient did not have access to video technology/had technical difficulties with video requiring transitioning to audio format only (telephone).  All issues noted in this document were discussed and addressed.  No physical exam could be performed with this format.  Evaluation Performed:  Follow-up visit  Date:  11/30/2021   ID:  Stephen Fuller, Stephen Fuller 11-06-1955, MRN [id number]  Patient Location: Home Provider Location: Clinic  Participants: Patient Location of Patient: Home Location of Provider: clinic Consent was obtain for visit to be over via telehealth. I verified that I am speaking with the correct person using two identifiers.  PCP:  Alvira Monday, FNP   Chief Complaint: Low BPs  History of Present Illness:    AARISH Fuller is a 66 y.o. male with complaints of low blood pressure since starting his new blood pressure regimen.  He was seen last on 11/24/2021 for essential hypertension.  His olmesartan-amlodipine-HCTZ 40-10-12.5 mg was discontinued, along with his metoprolol 50 mg twice daily.  He was started on olmesartan-amlodipine-HCTZ 40 -10-25.  The patient reports that since starting his new treatment regimen, his blood pressure has been ranging in the [id number] systolic and 123456 diastolic.  He complains of increased dizziness, headaches, blurred vision, palpitations, and fatigue.  He reports that he started the new pill pack on 11/27/2021, which will last him 4 weeks.   The patient does not have symptoms concerning for COVID-19 infection (fever, chills, cough, or new shortness of breath).   Past Medical, Surgical, Social History, Allergies, and Medications have been Reviewed.  Past Medical History:  Diagnosis Date   Arthritis    Chest pain    Hepatitis C virus infection cured after antiviral drug therapy     Hypertension    Palpitations    Past Surgical History:  Procedure Laterality Date   CHOLECYSTECTOMY  2005   COLONOSCOPY  2019   LIVER BIOPSY  2019     Current Meds  Medication Sig   allopurinol (ZYLOPRIM) 300 MG tablet Take 1 tablet (300 mg total) by mouth daily.   aspirin EC 81 MG tablet Take 1 tablet (81 mg total) by mouth daily.   busPIRone (BUSPAR) 5 MG tablet Take by mouth.   gabapentin (NEURONTIN) 400 MG capsule Take by mouth.   HYDROcodone-acetaminophen (NORCO) 7.5-325 MG tablet One every six hours as needed for pain.  Seven day limit   Olmesartan-amLODIPine-HCTZ 40-10-25 MG TABS Take 1 tablet by mouth daily at 6 (six) AM.   rosuvastatin (CRESTOR) 10 MG tablet Take by mouth.   tadalafil (CIALIS) 10 MG tablet Take 1 tablet (10 mg total) by mouth every other day as needed for erectile dysfunction.     Allergies:   Morphine and related   ROS:   Please see the history of present illness.     All other systems reviewed and are negative.   Labs/Other Tests and Data Reviewed:    Recent Labs: 10/26/2021: ALT 35; Hemoglobin 16.4; Platelets 180; TSH 1.700 11/24/2021: BUN 24; Creatinine, Ser 1.31; Potassium 3.8; Sodium 143   Recent Lipid Panel Lab Results  Component Value Date/Time   CHOL 85 (L) 10/26/2021 08:39 AM   CHOL 263 (H) 08/28/2012 03:58 PM   TRIG 95 10/26/2021 08:39 AM   TRIG 406 (H) 08/28/2012 03:58 PM   HDL 45 10/26/2021 08:39 AM  HDL 68 08/28/2012 03:58 PM   CHOLHDL 1.9 10/26/2021 08:39 AM   LDLCALC 22 10/26/2021 08:39 AM   LDLCALC NOT CALC 08/28/2012 03:58 PM    Wt Readings from Last 3 Encounters:  11/24/21 221 lb 6.4 oz (100.4 kg)  10/25/21 216 lb 1.3 oz (98 kg)  09/27/21 218 lb (98.9 kg)     Objective:    Vital Signs:  There were no vitals taken for this visit.     ASSESSMENT & PLAN:   Hypertension Encouraged the patient to take half a pill of omesartan-amlodipine-HCTZ 40 -10-25 He reports adequate fluid consumption, encouraged to continue  drinking at least 64 ounces of water daily Encouraged eating before taking his antihypertensive Follow-up in 4 weeks to assess blood pressure Encourage patient to check his blood pressure once daily after taking his medication Will make changes to his blood pressure regimen at his next office visit in 4 weeks  Time:   Today, I have spent 12 minutes reviewing the chart, including problem list, medications, and with the patient with telehealth technology discussing the above problems.   Medication Adjustments/Labs and Tests Ordered: Current medicines are reviewed at length with the patient today.  Concerns regarding medicines are outlined above.   Tests Ordered: No orders of the defined types were placed in this encounter.   Medication Changes: No orders of the defined types were placed in this encounter.    Note: This dictation was prepared with Dragon dictation along with smaller phrase technology. Similar sounding words can be transcribed inadequately or may not be corrected upon review. Any transcriptional errors that result from this process are unintentional.      Disposition:  Follow up  Signed, Alvira Monday, FNP  11/30/2021 4:18 PM     [facility]

## 2021-12-14 DIAGNOSIS — G894 Chronic pain syndrome: Secondary | ICD-10-CM | POA: Diagnosis not present

## 2021-12-14 DIAGNOSIS — M5417 Radiculopathy, lumbosacral region: Secondary | ICD-10-CM | POA: Diagnosis not present

## 2021-12-14 DIAGNOSIS — Z79899 Other long term (current) drug therapy: Secondary | ICD-10-CM | POA: Diagnosis not present

## 2021-12-14 DIAGNOSIS — M542 Cervicalgia: Secondary | ICD-10-CM | POA: Diagnosis not present

## 2021-12-14 DIAGNOSIS — Z79891 Long term (current) use of opiate analgesic: Secondary | ICD-10-CM | POA: Diagnosis not present

## 2021-12-16 ENCOUNTER — Ambulatory Visit: Payer: Medicare HMO | Admitting: Urology

## 2021-12-16 VITALS — BP 177/76 | HR 76

## 2021-12-16 DIAGNOSIS — N5201 Erectile dysfunction due to arterial insufficiency: Secondary | ICD-10-CM

## 2021-12-16 DIAGNOSIS — N500 Atrophy of testis: Secondary | ICD-10-CM | POA: Diagnosis not present

## 2021-12-16 DIAGNOSIS — E230 Hypopituitarism: Secondary | ICD-10-CM | POA: Diagnosis not present

## 2021-12-16 NOTE — Progress Notes (Signed)
Subjective: 1. Hypogonadotropic hypogonadism (HCC)   2. Erectile dysfunction due to arterial insufficiency   3. Testicular atrophy      Stephen Fuller is a 66 yo male who is here to discuss testosterone replacement therapy.  He has been on injectable testosterone for over a year.  He has chronic pain and has been on oxycodone.   He was getting TRT through an MD in [place] but he can't do that anymore because of new regulations. He was getting a pill with the injections but he doesn't know what that was.  He was getting 100mg  weekly.  He had been doing well on therapy with a T of in the 600's.  He is on tadalafil for ED with success.   He is voiding well with with an IPSS of 0.   He last had labs done about 6 weeks ago.  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  His Cr was 1.31 on 11/24/21 and his LFT's were normal.  His GFR is 60.  ROS:  Review of Systems  Musculoskeletal:  Positive for back pain and joint pain.  Psychiatric/Behavioral:  Positive for depression. The patient is nervous/anxious.   All other systems reviewed and are negative.   Allergies  Allergen Reactions   Morphine And Related Other (See Comments)    abd pain and inability to urinate    Past Medical History:  Diagnosis Date   Arthritis    Chest pain    Hepatitis C virus infection cured after antiviral drug therapy    Hypertension    Palpitations     Past Surgical History:  Procedure Laterality Date   CHOLECYSTECTOMY  2005   COLONOSCOPY  2019   LIVER BIOPSY  2019    Social History   Socioeconomic History   Marital status: Married    Spouse name: Not on file   Number of children: 2   Years of education: Not on file   Highest education level: Not on file  Occupational History   Occupation: UNEMPLOYED  Tobacco Use   Smoking status: Every Day    Packs/day: 0.50    Years: 37.00    Total pack years: 18.50    Types: Cigarettes   Smokeless tobacco: Never  Vaping Use   Vaping  Use: Never used  Substance and Sexual Activity   Alcohol use: Yes    Alcohol/week: 2.0 standard drinks of alcohol    Types: 2 Standard drinks or equivalent per week    Comment: Occasional   Drug use: No   Sexual activity: Not on file  Other Topics Concern   Not on file  Social History Narrative   Not on file   Social Determinants of Health   Financial Resource Strain: Low Risk  (09/27/2021)   Overall Financial Resource Strain (CARDIA)    Difficulty of Paying Living Expenses: Not hard at all  Food Insecurity: No Food Insecurity (09/27/2021)   Hunger Vital Sign    Worried About Running Out of Food in the Last Year: Never true    Ran Out of Food in the Last Year: Never true  Transportation Needs: No Transportation Needs (09/27/2021)   PRAPARE - 09/29/2021 (Medical): No    Lack of Transportation (Non-Medical): No  Physical Activity: Sufficiently Active (09/27/2021)   Exercise Vital Sign    Days of Exercise per Week: 5 days    Minutes of Exercise per Session: 30 min  Stress: No  Stress Concern Present (09/27/2021)   [place]    Feeling of Stress : Not at all  Social Connections: Moderately Isolated (09/27/2021)   Social Connection and Isolation Panel [NHANES]    Frequency of Communication with Friends and Family: Twice a week    Frequency of Social Gatherings with Friends and Family: Once a week    Attends Religious Services: Never    Marine scientist or Organizations: No    Attends Archivist Meetings: Never    Marital Status: Married  Human resources officer Violence: Not At Risk (09/27/2021)   Humiliation, Afraid, Rape, and Kick questionnaire    Fear of Current or Ex-Partner: No    Emotionally Abused: No    Physically Abused: No    Sexually Abused: No    Family History  Problem Relation Age of Onset   Diabetes Mother    Stroke Mother    Hypertension Mother    Heart  disease Unknown        No family history   Diabetes Brother    Cirrhosis Brother    Colon cancer Neg Hx     Anti-infectives: Anti-infectives (From admission, onward)    None       Current Outpatient Medications  Medication Sig Dispense Refill   oxyCODONE-acetaminophen (PERCOCET) 10-325 MG tablet Take 1 tablet by mouth 4 (four) times daily as needed.     allopurinol (ZYLOPRIM) 300 MG tablet Take 1 tablet (300 mg total) by mouth daily. 90 tablet 1   aspirin EC 81 MG tablet Take 1 tablet (81 mg total) by mouth daily.     busPIRone (BUSPAR) 5 MG tablet Take by mouth.     gabapentin (NEURONTIN) 400 MG capsule Take by mouth.     Olmesartan-amLODIPine-HCTZ 40-10-25 MG TABS Take 1 tablet by mouth daily at 6 (six) AM. 30 tablet 1   rosuvastatin (CRESTOR) 10 MG tablet Take by mouth.     tadalafil (CIALIS) 10 MG tablet Take 1 tablet (10 mg total) by mouth every other day as needed for erectile dysfunction. 20 tablet 1   No current facility-administered medications for this visit.     Objective: Vital signs in last 24 hours: BP (!) 177/76   Pulse 76   Intake/Output from previous day: No intake/output data recorded. Intake/Output this shift: @IOTHISSHIFT @   Physical Exam Vitals reviewed.  Constitutional:      Appearance: Normal appearance.  Cardiovascular:     Rate and Rhythm: Normal rate and regular rhythm.     Pulses: Normal pulses.     Heart sounds: Normal heart sounds.  Pulmonary:     Effort: No respiratory distress.  Abdominal:     General: Abdomen is flat.     Palpations: Abdomen is soft.     Hernia: No hernia is present.  Genitourinary:    Comments: Uncirc phallus with adequate meatus Scrotum normal. Testes atrophied. Epididymis nl. AP without lesions. NST without mass. Prostate 2+ benign. SV non-palpable.  Musculoskeletal:        General: No swelling. Normal range of motion.  Skin:    General: Skin is warm and dry.  Neurological:     General: No focal  deficit present.     Mental Status: He is alert.  Psychiatric:        Mood and Affect: Mood normal.        Behavior: Behavior normal.     Lab Results:  Results for orders placed or  performed in visit on 12/16/21 (from the past 24 hour(s))  PSA, total and free     Status: Abnormal   Collection Time: 12/16/21 11:51 AM  Result Value Ref Range   Prostate Specific Ag, Serum 4.3 (H) 0.0 - 4.0 ng/mL   PSA, Free 1.07 N/A ng/mL   PSA, Free Pct 24.9 %   Narrative   Performed at:  [address] [place], [place]  [id number] Lab Director: Jolene Schimke MD, Phone:  [phone]  Hemoglobin and hematocrit, blood     Status: None   Collection Time: 12/16/21 11:51 AM  Result Value Ref Range   Hemoglobin 17.1 13.0 - 17.7 g/dL   Hematocrit 58.5 27.7 - 51.0 %   Narrative   Performed at:  [address] [facility] [address], [place], [place]  [id number] Lab Director: Jolene Schimke MD, Phone:  [phone]  Testosterone Free, Profile I     Status: Abnormal   Collection Time: 12/16/21 11:51 AM  Result Value Ref Range   Albumin 5.0 (H) 3.9 - 4.9 g/dL   Testosterone 761 950 - 916 ng/dL   Sex Hormone Binding 93.2 (L) 19.3 - 76.4 nmol/L   Testost., Free, Calc 238.9 (H) 34.7 - 150.3 pg/mL   Narrative   Performed at:  [address], [place], [place]  [id number] Lab Director: Jolene Schimke MD, Phone:  [phone]    BMET No results for input(s): "NA", "K", "CL", "CO2", "GLUCOSE", "BUN", "CREATININE", "CALCIUM" in the last 72 hours. PT/INR No results for input(s): "LABPROT", "INR" in the last 72 hours. ABG No results for input(s): "PHART", "HCO3" in the last 72 hours.  Invalid input(s): "PCO2", "PO2" Labs in EPIC reviewed including CMP and CBC.  Studies/Results: No results found. Prior PCP notes reviewed.   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    He was would like to consider Testopel so I will get baseline labs and have him return to initiate therapy.  ED.  He is doing well on Tadalafil.  No orders of the defined types were placed in this encounter.    Orders Placed This Encounter  Procedures   PSA, total and free   Hemoglobin and hematocrit, blood   Testosterone Free, Profile I     Return for He needs to return for testopel in 2-3 weeks.   .    CC: [name] NP.      Bjorn Pippin 12/17/2021

## 2021-12-17 ENCOUNTER — Encounter: Payer: Self-pay | Admitting: Urology

## 2021-12-17 LAB — TESTOSTERONE FREE, PROFILE I
Albumin: 5 g/dL — ABNORMAL HIGH (ref 3.9–4.9)
Sex Hormone Binding: 14.3 nmol/L — ABNORMAL LOW (ref 19.3–76.4)
Testost., Free, Calc: 238.9 pg/mL — ABNORMAL HIGH (ref 34.7–150.3)
Testosterone: 834 ng/dL (ref 264–916)

## 2021-12-17 LAB — PSA, TOTAL AND FREE
PSA, Free Pct: 24.9 %
PSA, Free: 1.07 ng/mL
Prostate Specific Ag, Serum: 4.3 ng/mL — ABNORMAL HIGH (ref 0.0–4.0)

## 2021-12-17 LAB — HEMOGLOBIN AND HEMATOCRIT, BLOOD
Hematocrit: 48 % (ref 37.5–51.0)
Hemoglobin: 17.1 g/dL (ref 13.0–17.7)

## 2021-12-21 ENCOUNTER — Telehealth: Payer: Self-pay

## 2021-12-21 NOTE — Telephone Encounter (Signed)
Tried calling patient with no answer. Left message for patient to call our office back.

## 2021-12-21 NOTE — Telephone Encounter (Signed)
-----   Message from Irine Seal, MD sent at 12/21/2021  8:31 AM EST ----- His testosterone level is good on current therapy and his Hgb is normal at 17.1.  This is slightly higher and will need monitoring going forward.    His PSA is 4.3 which is slightly high with normal to 4, but f/t ratio is favorable which is most consistent with benign disease.   I don't have prior PSA's and if he has any of those from his doc in [place] or his PCP, I would like to see those.   I would also like to have him come leave a urine for an ExoDx test to further clarify his risk of prostate cancer with the mild PSA elevation.  He has f/u in 11/30 so it would be good to get that test going.   ----- Message ----- From: Sherrilyn Rist, CMA Sent: 12/17/2021  12:23 PM EST To: Irine Seal, MD  Please review

## 2021-12-22 ENCOUNTER — Other Ambulatory Visit: Payer: Self-pay | Admitting: Family Medicine

## 2021-12-22 DIAGNOSIS — N529 Male erectile dysfunction, unspecified: Secondary | ICD-10-CM

## 2021-12-22 NOTE — Telephone Encounter (Signed)
Tried calling patient several times with no answer. Left voice message for patient to call office back. Letter sent out informing patient

## 2021-12-27 ENCOUNTER — Ambulatory Visit (INDEPENDENT_AMBULATORY_CARE_PROVIDER_SITE_OTHER): Payer: Medicare HMO | Admitting: Family Medicine

## 2021-12-27 ENCOUNTER — Encounter: Payer: Self-pay | Admitting: Family Medicine

## 2021-12-27 DIAGNOSIS — M10071 Idiopathic gout, right ankle and foot: Secondary | ICD-10-CM | POA: Diagnosis not present

## 2021-12-27 MED ORDER — COLCHICINE 0.6 MG PO TABS: ORAL_TABLET | ORAL | 0 refills | Status: DC

## 2021-12-27 NOTE — Telephone Encounter (Signed)
Letter sent out 

## 2021-12-27 NOTE — Progress Notes (Signed)
Virtual Visit via Telephone Note   This visit type was conducted via telephone. This format is felt to be most appropriate for this patient at this time.  The patient did not have access to video technology/had technical difficulties with video requiring transitioning to audio format only (telephone).  All issues noted in this document were discussed and addressed.  No physical exam could be performed with this format.  Evaluation Performed:  Follow-up visit  Date:  12/27/2021   ID:  Stephen Fuller, Stephen Fuller [birth year], MRN [id number]  Patient Location: Home Provider Location: Clinic  Participants: Patient Location of Patient: Home Location of Provider: clinic Consent was obtain for visit to be over via telehealth. I verified that I am speaking with the correct person using two identifiers.  PCP:  [name], FNP   Chief Complaint: Gout flareup  History of Present Illness:    Stephen Fuller is a 66 y.o. male with history of gout of the right foot is seen today with complaints of gout flareup.  He has taken allopurinol 300 mg daily for preventative therapy.  He reports the onset of gout flareup  5 days ago with minimal relief of his symptoms with Percocet and allopurinol.  He reports taking colchicine for gout flareup in the past with symptom relief.  He rates pain 10 out of 10.  .   The patient does not have symptoms concerning for COVID-19 infection (fever, chills, cough, or new shortness of breath).   Past Medical, Surgical, Social History, Allergies, and Medications have been Reviewed.  Past Medical History:  Diagnosis Date   Arthritis    Chest pain    Hepatitis C virus infection cured after antiviral drug therapy    Hypertension    Palpitations    Past Surgical History:  Procedure Laterality Date   CHOLECYSTECTOMY  2005   COLONOSCOPY  2019   LIVER BIOPSY  2019     Current Meds  Medication Sig   allopurinol (ZYLOPRIM) 300 MG tablet Take 1 tablet (300 mg  total) by mouth daily.   aspirin EC 81 MG tablet Take 1 tablet (81 mg total) by mouth daily.   busPIRone (BUSPAR) 5 MG tablet Take by mouth.   colchicine 0.6 MG tablet 1.2 mg PO initially, then 0.6 mg 1hr later; wait 12 hr, then 0.6 mg BID until flare up resolves   gabapentin (NEURONTIN) 400 MG capsule Take by mouth.   Olmesartan-amLODIPine-HCTZ 40-10-25 MG TABS Take 1 tablet by mouth daily at 6 (six) AM.   oxyCODONE-acetaminophen (PERCOCET) 10-325 MG tablet Take 1 tablet by mouth 4 (four) times daily as needed.   rosuvastatin (CRESTOR) 10 MG tablet Take by mouth.   tadalafil (CIALIS) 10 MG tablet TAKE 1 TABLET EVERY OTHER DAY AS NEEDED FOR ED.     Allergies:   Morphine and related   ROS:   Please see the history of present illness.     All other systems reviewed and are negative.   Labs/Other Tests and Data Reviewed:    Recent Labs: 10/26/2021: ALT 35; Platelets 180; TSH 1.700 11/24/2021: BUN 24; Creatinine, Ser 1.31; Potassium 3.8; Sodium 143 12/16/2021: Hemoglobin 17.1   Recent Lipid Panel Lab Results  Component Value Date/Time   CHOL 85 (L) 10/26/2021 08:39 AM   CHOL 263 (H) 08/28/2012 03:58 PM   TRIG 95 10/26/2021 08:39 AM   TRIG 406 (H) 08/28/2012 03:58 PM   HDL 45 10/26/2021 08:39 AM   HDL 68 08/28/2012 03:58 PM  CHOLHDL 1.9 10/26/2021 08:39 AM   LDLCALC 22 10/26/2021 08:39 AM   LDLCALC NOT CALC 08/28/2012 03:58 PM    Wt Readings from Last 3 Encounters:  11/24/21 221 lb 6.4 oz (100.4 kg)  10/25/21 216 lb 1.3 oz (98 kg)  09/27/21 218 lb (98.9 kg)     Objective:    Vital Signs:  There were no vitals taken for this visit.     ASSESSMENT & PLAN:   Gout flareup Flareup of the gout of the metatarsophalangeal joint of the right first toe He rates pain 10 out of 10 We will treat with colchicine for acute attacks [place] patient to take 1.2 mg p.o. initially, then 0.6 mg 1 hour later; wait 12 hours, then 0.6 mg twice daily until flareup resolves Encouraged to  continue taking analgesic for pain management and to limit foods high purine  Time:   Today, I have spent 15 minutes reviewing the chart, including problem list, medications, and with the patient with telehealth technology discussing the above problems.   Medication Adjustments/Labs and Tests Ordered: Current medicines are reviewed at length with the patient today.  Concerns regarding medicines are outlined above.   Tests Ordered: No orders of the defined types were placed in this encounter.   Medication Changes: Meds ordered this encounter  Medications   colchicine 0.6 MG tablet    Sig: 1.2 mg PO initially, then 0.6 mg 1hr later; wait 12 hr, then 0.6 mg BID until flare up resolves    Dispense:  14 tablet    Refill:  0     Note: This dictation was prepared with Dragon dictation along with smaller phrase technology. Similar sounding words can be transcribed inadequately or may not be corrected upon review. Any transcriptional errors that result from this process are unintentional.      Disposition:  Follow up  Signed, [name], FNP  12/27/2021 9:17 AM     [facility]

## 2021-12-27 NOTE — Telephone Encounter (Signed)
-----   Message from Bjorn Pippin, MD sent at 12/21/2021  8:31 AM EST ----- His testosterone level is good on current therapy and his Hgb is normal at 17.1.  This is slightly higher and will need monitoring going forward.    His PSA is 4.3 which is slightly high with normal to 4, but f/t ratio is favorable which is most consistent with benign disease.   I don't have prior PSA's and if he has any of those from his doc in [place] or his PCP, I would like to see those.   I would also like to have him come leave a urine for an ExoDx test to further clarify his risk of prostate cancer with the mild PSA elevation.  He has f/u in 11/30 so it would be good to get that test going.   ----- Message ----- From: Troy Sine, CMA Sent: 12/17/2021  12:23 PM EST To: Bjorn Pippin, MD  Please review

## 2021-12-27 NOTE — Telephone Encounter (Signed)
Tried calling patient with no answer. 

## 2022-01-11 DIAGNOSIS — M5417 Radiculopathy, lumbosacral region: Secondary | ICD-10-CM | POA: Diagnosis not present

## 2022-01-11 DIAGNOSIS — G894 Chronic pain syndrome: Secondary | ICD-10-CM | POA: Diagnosis not present

## 2022-01-11 DIAGNOSIS — F411 Generalized anxiety disorder: Secondary | ICD-10-CM | POA: Diagnosis not present

## 2022-01-11 DIAGNOSIS — M542 Cervicalgia: Secondary | ICD-10-CM | POA: Diagnosis not present

## 2022-01-13 ENCOUNTER — Ambulatory Visit (INDEPENDENT_AMBULATORY_CARE_PROVIDER_SITE_OTHER): Payer: Medicare HMO | Admitting: Urology

## 2022-01-13 VITALS — BP 174/76 | HR 85

## 2022-01-13 DIAGNOSIS — E230 Hypopituitarism: Secondary | ICD-10-CM | POA: Diagnosis not present

## 2022-01-13 MED ORDER — TESTOSTERONE 75 MG IL PLLT
450.0000 mg | PELLET | Freq: Once | Status: AC
  Administered 2022-01-13: 450 mg

## 2022-01-13 MED ORDER — LIDOCAINE-EPINEPHRINE 1 %-1:100000 IJ SOLN
10.0000 mL | Freq: Once | INTRAMUSCULAR | Status: AC
  Administered 2022-01-13: 10 mL via INTRADERMAL

## 2022-01-13 NOTE — Progress Notes (Signed)
Subjective: 1. Hypogonadotropic hypogonadism (HCC)    01/13/22: Stephen Fuller returns today in f/u for testopel implant.  He had his labs sent from [place] from 10/28/21 and his PSA was 2.93 with a T of 664 and a Hgb of 16.3.   12/16/21: Stephen Fuller is a 66 yo male who is here to discuss testosterone replacement therapy.  He has been on injectable testosterone for over a year.  He has chronic pain and has been on oxycodone.   He was getting TRT through an MD in [place] but he can't do that anymore because of new regulations. He was getting a pill with the injections but he doesn't know what that was.  He was getting 100mg  weekly.  He had been doing well on therapy with a T of in the 600's.  He is on tadalafil for ED with success.   He is voiding well with with an IPSS of 0.   He last had labs done about 6 weeks ago.  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  His Cr was 1.31 on 11/24/21 and his LFT's were normal.  His GFR is 60.  ROS:  Review of Systems  Musculoskeletal:  Positive for back pain and joint pain.  Psychiatric/Behavioral:  Positive for depression. The patient is nervous/anxious.   All other systems reviewed and are negative.   Allergies  Allergen Reactions   Morphine And Related Other (See Comments)    abd pain and inability to urinate    Past Medical History:  Diagnosis Date   Arthritis    Chest pain    Hepatitis C virus infection cured after antiviral drug therapy    Hypertension    Palpitations     Past Surgical History:  Procedure Laterality Date   CHOLECYSTECTOMY  2005   COLONOSCOPY  2019   LIVER BIOPSY  2019    Social History   Socioeconomic History   Marital status: Married    Spouse name: Not on file   Number of children: 2   Years of education: Not on file   Highest education level: Not on file  Occupational History   Occupation: UNEMPLOYED  Tobacco Use   Smoking status: Every Day    Packs/day: 0.50    Years: 37.00    Total  pack years: 18.50    Types: Cigarettes   Smokeless tobacco: Never  Vaping Use   Vaping Use: Never used  Substance and Sexual Activity   Alcohol use: Yes    Alcohol/week: 2.0 standard drinks of alcohol    Types: 2 Standard drinks or equivalent per week    Comment: Occasional   Drug use: No   Sexual activity: Not on file  Other Topics Concern   Not on file  Social History Narrative   Not on file   Social Determinants of Health   Financial Resource Strain: Low Risk  (09/27/2021)   Overall Financial Resource Strain (CARDIA)    Difficulty of Paying Living Expenses: Not hard at all  Food Insecurity: No Food Insecurity (09/27/2021)   Hunger Vital Sign    Worried About Running Out of Food in the Last Year: Never true    Ran Out of Food in the Last Year: Never true  Transportation Needs: No Transportation Needs (09/27/2021)   PRAPARE - 09/29/2021 (Medical): No    Lack of Transportation (Non-Medical): No  Physical Activity: Sufficiently Active (09/27/2021)   Exercise Vital Sign  Days of Exercise per Week: 5 days    Minutes of Exercise per Session: 30 min  Stress: No Stress Concern Present (09/27/2021)   [place]    Feeling of Stress : Not at all  Social Connections: Moderately Isolated (09/27/2021)   Social Connection and Isolation Panel [NHANES]    Frequency of Communication with Friends and Family: Twice a week    Frequency of Social Gatherings with Friends and Family: Once a week    Attends Religious Services: Never    Marine scientist or Organizations: No    Attends Archivist Meetings: Never    Marital Status: Married  Human resources officer Violence: Not At Risk (09/27/2021)   Humiliation, Afraid, Rape, and Kick questionnaire    Fear of Current or Ex-Partner: No    Emotionally Abused: No    Physically Abused: No    Sexually Abused: No    Family History  Problem  Relation Age of Onset   Diabetes Mother    Stroke Mother    Hypertension Mother    Heart disease Unknown        No family history   Diabetes Brother    Cirrhosis Brother    Colon cancer Neg Hx     Anti-infectives: Anti-infectives (From admission, onward)    None       Current Outpatient Medications  Medication Sig Dispense Refill   allopurinol (ZYLOPRIM) 300 MG tablet Take 1 tablet (300 mg total) by mouth daily. 90 tablet 1   aspirin EC 81 MG tablet Take 1 tablet (81 mg total) by mouth daily.     busPIRone (BUSPAR) 5 MG tablet Take by mouth.     colchicine 0.6 MG tablet 1.2 mg PO initially, then 0.6 mg 1hr later; wait 12 hr, then 0.6 mg BID until flare up resolves 14 tablet 0   gabapentin (NEURONTIN) 400 MG capsule Take by mouth.     Olmesartan-amLODIPine-HCTZ 40-10-25 MG TABS Take 1 tablet by mouth daily at 6 (six) AM. 30 tablet 1   oxyCODONE-acetaminophen (PERCOCET) 10-325 MG tablet Take 1 tablet by mouth 4 (four) times daily as needed.     rosuvastatin (CRESTOR) 10 MG tablet Take by mouth.     tadalafil (CIALIS) 10 MG tablet TAKE 1 TABLET EVERY OTHER DAY AS NEEDED FOR ED. 20 tablet 0   No current facility-administered medications for this visit.     Objective: Vital signs in last 24 hours: BP (!) 174/76   Pulse 85   Intake/Output from previous day: No intake/output data recorded. Intake/Output this shift: @IOTHISSHIFT @   Physical Exam  Lab Results:  No results found for this or any previous visit (from the past 24 hour(s)).   BMET No results for input(s): "NA", "K", "CL", "CO2", "GLUCOSE", "BUN", "CREATININE", "CALCIUM" in the last 72 hours. PT/INR No results for input(s): "LABPROT", "INR" in the last 72 hours. ABG No results for input(s): "PHART", "HCO3" in the last 72 hours.  Invalid input(s): "PCO2", "PO2" Labs in EPIC reviewed including CMP and CBC.  Studies/Results: No results found. Procedure:  TESTOPEL IMPLANT   Informed consent is obtained  with risks, benefits, alternatives, and indications explained to the patient in great detail.  The patient gives his consent to proceed. A sterile prep and drape of the right upper outer buttocks was performed.  Local anesthesia was injected using 2% lidocaine 20 ml with a 27-gauge needle.  A small puncture was made using an 11  blade, 3 mm in length.  The trocar was inserted at a 30 degree angle with flattening out once the subdermal fat layer was entered.  Testopel pellets were inserted into the trocar well and 6 pellets (75 mg each) were implanted using a V shape technique and 2 passes of the trocar.  Pellets advanced using the blunt ended stylet  while slowly withdrawing the trocar sheath.  Steri-Strips and sterile dressing applied.  Ice pack applied for 15 minutes in the office.  No complications were noted and the procedure was well tolerated by the patient.  He was advised to place an ice pack on to the insertion site.  He is advised to return to the office in 3 months with labs - .   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  He will return in 3 months with labs for the next testopel.   ED.  He is doing well on Tadalafil.  No orders of the defined types were placed in this encounter.    No orders of the defined types were placed in this encounter.    Return in about 3 months (around 04/14/2022) for with labs for possible testopel reimplant if labs show need. .    CC: Alvira Monday NP.      Irine Seal 01/13/2022

## 2022-01-13 NOTE — Patient Instructions (Signed)
TESTOPEL POST-INSERTION INSTRUCTIONS  Consider the steps to care for your Testopel insertion site:  - You may apply ice to the site for 20-30 minutes every hour after insertion, as needed.  You may consider taking a over the counter pain reliever if discomfort continues.  Remember, you might experience tenderness at the site for a few days following insertion.  You may experience redness and swelling at the site.  - Avoid hot tubs, swimming, or full water immersion of the insertion site for 1 week.  You may shower the following day.  - Remove the bandage after 2 days and adhesive strips after 1 week.  - Call your doctor if you experiencing any of the following: Discharge from insertion site Excessive redness or swelling Chills and/or fever greater than 101.5 degrees Nausea or vomiting Dizziness or lightheadedness Excessive tenderness Any other symptoms that may be related to the procedure  - Avoid strenuous activity and heavy lifting for 2 days.

## 2022-01-14 ENCOUNTER — Encounter: Payer: Self-pay | Admitting: Urology

## 2022-01-24 ENCOUNTER — Encounter: Payer: Self-pay | Admitting: Family Medicine

## 2022-01-24 ENCOUNTER — Other Ambulatory Visit: Payer: Self-pay | Admitting: Family Medicine

## 2022-01-24 ENCOUNTER — Ambulatory Visit (INDEPENDENT_AMBULATORY_CARE_PROVIDER_SITE_OTHER): Payer: Medicare HMO | Admitting: Family Medicine

## 2022-01-24 VITALS — BP 138/70 | HR 72 | Ht 72.0 in | Wt 210.1 lb

## 2022-01-24 DIAGNOSIS — I10 Essential (primary) hypertension: Secondary | ICD-10-CM

## 2022-01-24 DIAGNOSIS — M10071 Idiopathic gout, right ankle and foot: Secondary | ICD-10-CM

## 2022-01-24 DIAGNOSIS — N528 Other male erectile dysfunction: Secondary | ICD-10-CM | POA: Diagnosis not present

## 2022-01-24 DIAGNOSIS — Z720 Tobacco use: Secondary | ICD-10-CM

## 2022-01-24 MED ORDER — OLMESARTAN-AMLODIPINE-HCTZ 40-10-25 MG PO TABS: ORAL_TABLET | ORAL | 1 refills | Status: DC

## 2022-01-24 MED ORDER — TADALAFIL 20 MG PO TABS: 10.0000 mg | ORAL_TABLET | ORAL | 2 refills | Status: DC | PRN

## 2022-01-24 NOTE — Patient Instructions (Addendum)
I appreciate the opportunity to provide care to you today!    Follow up:  3 months  Labs: Next visit    Please pick up your prescriptions at the pharmacy   Please continue to a heart-healthy diet and increase your physical activities. Try to exercise for at least three times a week.      It was a pleasure to see you and I look forward to continuing to work together on your health and well-being. Please do not hesitate to call the office if you need care or have questions about your care.   Have a wonderful day and week. With Gratitude, [name] MSN, FNP-BC

## 2022-01-24 NOTE — Assessment & Plan Note (Signed)
Controlled Encouraged the patient to continue taking 1/2 tablet of olmesartan-amlodipine-HCTZ 40 -10-25 The patient has lost 11 pounds since his last visit He reports low carbs, low sugar with increased physical activities Congratulated patient on his weight loss and blood pressure BP Readings from Last 3 Encounters:  01/24/22 138/70  01/13/22 (!) 174/76  12/16/21 (!) 177/76

## 2022-01-24 NOTE — Assessment & Plan Note (Signed)
Will increase the dose of Cialis to 20 mg as needed for erectile dysfunction Prescription sent to the pharmacy

## 2022-01-24 NOTE — Progress Notes (Signed)
Established Patient Office Visit  Subjective:  Patient ID: Stephen Fuller, male    DOB: [birth year]  Age: 66 y.o. MRN: [id number]  CC:  Chief Complaint  Patient presents with   Follow-up    3 month f/u, pt reports just now taking his pain meds this morning about an hour ago and it makes his bp spike up, checks bp at home has had good readings. Has lost 11lbs since last here. Want to discuss going up on cialis.     HPI Stephen Fuller is a 66 y.o. male with past medical history of hypertension, erectile dysfunction, and tobacco use presents for f/u of  chronic medical conditions.  Hypertension: he takes half a pill of olmesartan-amlodipine-HCTZ 40 -10-25.He denies chest pain, palpitation, shortness of breath, headaches, and dizziness.  He reports compliance with treatment regimen.   Erectile dysfunction: He takes Cialis 10 mg as needed for erectile dysfunction and would like to increase the dose if possible.   Tobacco Use: He smokes half a pack a day of cigarettes with no intent of quitting.     Past Medical History:  Diagnosis Date   Arthritis    Chest pain    Hepatitis C virus infection cured after antiviral drug therapy    Hypertension    Palpitations     Past Surgical History:  Procedure Laterality Date   CHOLECYSTECTOMY  2005   COLONOSCOPY  2019   LIVER BIOPSY  2019    Family History  Problem Relation Age of Onset   Diabetes Mother    Stroke Mother    Hypertension Mother    Heart disease Unknown        No family history   Diabetes Brother    Cirrhosis Brother    Colon cancer Neg Hx     Social History   Socioeconomic History   Marital status: Married    Spouse name: Not on file   Number of children: 2   Years of education: Not on file   Highest education level: Not on file  Occupational History   Occupation: UNEMPLOYED  Tobacco Use   Smoking status: Every Day    Packs/day: 0.50    Years: 37.00    Total pack years: 18.50    Types: Cigarettes    Smokeless tobacco: Never   Tobacco comments:    1/2 a pack a day  Vaping Use   Vaping Use: Never used  Substance and Sexual Activity   Alcohol use: Yes    Alcohol/week: 2.0 standard drinks of alcohol    Types: 2 Standard drinks or equivalent per week    Comment: Occasional   Drug use: No   Sexual activity: Not on file  Other Topics Concern   Not on file  Social History Narrative   Not on file   Social Determinants of Health   Financial Resource Strain: Low Risk  (09/27/2021)   Overall Financial Resource Strain (CARDIA)    Difficulty of Paying Living Expenses: Not hard at all  Food Insecurity: No Food Insecurity (09/27/2021)   Hunger Vital Sign    Worried About Running Out of Food in the Last Year: Never true    Ran Out of Food in the Last Year: Never true  Transportation Needs: No Transportation Needs (09/27/2021)   PRAPARE - Hydrologist (Medical): No    Lack of Transportation (Non-Medical): No  Physical Activity: Sufficiently Active (09/27/2021)   Exercise Vital Sign    Days  of Exercise per Week: 5 days    Minutes of Exercise per Session: 30 min  Stress: No Stress Concern Present (09/27/2021)   [place]    Feeling of Stress : Not at all  Social Connections: Moderately Isolated (09/27/2021)   Social Connection and Isolation Panel [NHANES]    Frequency of Communication with Friends and Family: Twice a week    Frequency of Social Gatherings with Friends and Family: Once a week    Attends Religious Services: Never    Marine scientist or Organizations: No    Attends Archivist Meetings: Never    Marital Status: Married  Human resources officer Violence: Not At Risk (09/27/2021)   Humiliation, Afraid, Rape, and Kick questionnaire    Fear of Current or Ex-Partner: No    Emotionally Abused: No    Physically Abused: No    Sexually Abused: No    Outpatient Medications Prior  to Visit  Medication Sig Dispense Refill   allopurinol (ZYLOPRIM) 300 MG tablet Take 1 tablet (300 mg total) by mouth daily. 90 tablet 1   aspirin EC 81 MG tablet Take 1 tablet (81 mg total) by mouth daily.     busPIRone (BUSPAR) 5 MG tablet Take by mouth.     colchicine 0.6 MG tablet 1.2 mg PO initially, then 0.6 mg 1hr later; wait 12 hr, then 0.6 mg BID until flare up resolves 14 tablet 0   DULoxetine (CYMBALTA) 20 MG capsule Take 20 mg by mouth daily.     gabapentin (NEURONTIN) 400 MG capsule Take by mouth.     oxyCODONE-acetaminophen (PERCOCET) 10-325 MG tablet Take 1 tablet by mouth 4 (four) times daily as needed.     rosuvastatin (CRESTOR) 10 MG tablet Take by mouth.     Olmesartan-amLODIPine-HCTZ 40-10-25 MG TABS Take 1 tablet by mouth daily at 6 (six) AM. 30 tablet 1   tadalafil (CIALIS) 10 MG tablet TAKE 1 TABLET EVERY OTHER DAY AS NEEDED FOR ED. 20 tablet 0   No facility-administered medications prior to visit.    Allergies  Allergen Reactions   Morphine And Related Other (See Comments)    abd pain and inability to urinate    ROS Review of Systems  Constitutional:  Negative for fatigue and fever.  HENT:  Negative for ear pain.   Eyes:  Negative for pain, discharge, itching and visual disturbance.  Respiratory:  Negative for chest tightness and shortness of breath.   Neurological:  Negative for dizziness and headaches.      Objective:    Physical Exam HENT:     Head: Normocephalic.     Right Ear: External ear normal.     Left Ear: External ear normal.  Cardiovascular:     Rate and Rhythm: Regular rhythm.     Heart sounds: No murmur heard. Pulmonary:     Effort: No respiratory distress.     Breath sounds: Normal breath sounds.  Musculoskeletal:     Cervical back: No rigidity.  Neurological:     Mental Status: He is alert.     BP 138/70 (BP Location: Left Arm)   Pulse 72   Ht 6' (1.829 m)   Wt 210 lb 1.3 oz (95.3 kg)   SpO2 96%   BMI 28.49 kg/m  Wt  Readings from Last 3 Encounters:  01/24/22 210 lb 1.3 oz (95.3 kg)  11/24/21 221 lb 6.4 oz (100.4 kg)  10/25/21 216 lb 1.3 oz (98  kg)    Lab Results  Component Value Date   TSH 1.700 10/26/2021   Lab Results  Component Value Date   WBC 11.8 (H) 10/26/2021   HGB 17.1 12/16/2021   HCT 48.0 12/16/2021   MCV 91 10/26/2021   PLT 180 10/26/2021   Lab Results  Component Value Date   NA 143 11/24/2021   K 3.8 11/24/2021   CO2 24 11/24/2021   GLUCOSE 83 11/24/2021   BUN 24 11/24/2021   CREATININE 1.31 (H) 11/24/2021   BILITOT 0.7 10/26/2021   ALKPHOS 89 10/26/2021   AST 27 10/26/2021   ALT 35 10/26/2021   PROT 7.2 10/26/2021   ALBUMIN 5.0 (H) 12/16/2021   CALCIUM 9.9 11/24/2021   ANIONGAP 7 10/20/2017   EGFR 60 11/24/2021   Lab Results  Component Value Date   CHOL 85 (L) 10/26/2021   Lab Results  Component Value Date   HDL 45 10/26/2021   Lab Results  Component Value Date   LDLCALC 22 10/26/2021   Lab Results  Component Value Date   TRIG 95 10/26/2021   Lab Results  Component Value Date   CHOLHDL 1.9 10/26/2021   Lab Results  Component Value Date   HGBA1C 5.9 (H) 10/26/2021      Assessment & Plan:  Essential hypertension Assessment & Plan: Controlled Encouraged the patient to continue taking 1/2 tablet of olmesartan-amlodipine-HCTZ 40 -10-25 The patient has lost 11 pounds since his last visit He reports low carbs, low sugar with increased physical activities Congratulated patient on his weight loss and blood pressure BP Readings from Last 3 Encounters:  01/24/22 138/70  01/13/22 (!) 174/76  12/16/21 (!) 177/76     Orders: -     Olmesartan-amLODIPine-HCTZ; Take 1/2 a tablet by mouth daily  Dispense: 90 tablet; Refill: 1  Tobacco use Assessment & Plan: He smokes 1/2 pack of cigarettes daily with no intent on quitting Smoking cessation encourage We will reconvene at his next visit   Other male erectile dysfunction Assessment & Plan: Will  increase the dose of Cialis to 20 mg as needed for erectile dysfunction Prescription sent to the pharmacy  Orders: -     Tadalafil; Take 0.5-1 tablets (10-20 mg total) by mouth every other day as needed for erectile dysfunction.  Dispense: 15 tablet; Refill: 2    Follow-up: Return in about 3 months (around 04/25/2022).   Alvira Monday, FNP

## 2022-01-24 NOTE — Assessment & Plan Note (Signed)
He smokes 1/2 pack of cigarettes daily with no intent on quitting Smoking cessation encourage We will reconvene at his next visit

## 2022-01-25 ENCOUNTER — Other Ambulatory Visit: Payer: Self-pay | Admitting: Family Medicine

## 2022-01-25 ENCOUNTER — Other Ambulatory Visit: Payer: Self-pay

## 2022-01-25 ENCOUNTER — Telehealth: Payer: Self-pay | Admitting: Family Medicine

## 2022-01-25 DIAGNOSIS — I10 Essential (primary) hypertension: Secondary | ICD-10-CM

## 2022-01-25 MED ORDER — HYDROCHLOROTHIAZIDE 12.5 MG PO TABS: ORAL_TABLET | ORAL | 3 refills | Status: DC

## 2022-01-25 MED ORDER — AMLODIPINE-OLMESARTAN 10-40 MG PO TABS: 1.0000 | ORAL_TABLET | Freq: Every day | ORAL | 3 refills | Status: DC

## 2022-01-25 NOTE — Telephone Encounter (Signed)
Patient reports taking amlodipine-olmesartan 10-40 whole tablet and hydrochlorothiazide 12.5 milligrams (1/2 tablet) daily.  He reports that he has not been taking 1/2 tablet of of amlodipine-olmesartan-hydrochlorothiazide 10-40-12.5 mg daily.  Will send a new prescription to the pharmacy.

## 2022-01-25 NOTE — Telephone Encounter (Signed)
Spoke to pt states he has been doing amlodipine/olmesartan 10-40 (whole tablet) and HCTZ 12.5 (1/2 tablet) and this has been controlling blood pressure, pharmacy asking for a new prescription to be sent in with these instructions if this is how pt should be taking medication.

## 2022-01-25 NOTE — Telephone Encounter (Signed)
Pt called stating that his medication that was called in yesterday is messed up. He is wanting to speak to a nurse. Can you please call him when available?

## 2022-02-15 DIAGNOSIS — M5010 Cervical disc disorder with radiculopathy, unspecified cervical region: Secondary | ICD-10-CM | POA: Diagnosis not present

## 2022-02-15 DIAGNOSIS — F411 Generalized anxiety disorder: Secondary | ICD-10-CM | POA: Diagnosis not present

## 2022-02-15 DIAGNOSIS — G894 Chronic pain syndrome: Secondary | ICD-10-CM | POA: Diagnosis not present

## 2022-02-15 DIAGNOSIS — M545 Low back pain, unspecified: Secondary | ICD-10-CM | POA: Diagnosis not present

## 2022-02-15 DIAGNOSIS — M542 Cervicalgia: Secondary | ICD-10-CM | POA: Diagnosis not present

## 2022-02-15 DIAGNOSIS — Z79891 Long term (current) use of opiate analgesic: Secondary | ICD-10-CM | POA: Diagnosis not present

## 2022-02-15 DIAGNOSIS — M5417 Radiculopathy, lumbosacral region: Secondary | ICD-10-CM | POA: Diagnosis not present

## 2022-02-15 DIAGNOSIS — Z79899 Other long term (current) drug therapy: Secondary | ICD-10-CM | POA: Diagnosis not present

## 2022-02-21 ENCOUNTER — Telehealth: Payer: Self-pay

## 2022-02-21 NOTE — Telephone Encounter (Signed)
Patient called advising that he is not noticing a difference after the testopel placement  11/30. He advised he still feels fatigued and would like to come in for a blood draw to check testosterone levels.

## 2022-02-22 ENCOUNTER — Other Ambulatory Visit: Payer: Medicare HMO

## 2022-02-22 ENCOUNTER — Other Ambulatory Visit: Payer: Self-pay

## 2022-02-22 DIAGNOSIS — E230 Hypopituitarism: Secondary | ICD-10-CM

## 2022-02-23 ENCOUNTER — Other Ambulatory Visit: Payer: Self-pay | Admitting: Family Medicine

## 2022-02-23 DIAGNOSIS — M10071 Idiopathic gout, right ankle and foot: Secondary | ICD-10-CM

## 2022-02-23 LAB — PSA, TOTAL AND FREE
PSA, Free Pct: 18.1 %
PSA, Free: 0.96 ng/mL
Prostate Specific Ag, Serum: 5.3 ng/mL — ABNORMAL HIGH (ref 0.0–4.0)

## 2022-02-23 LAB — HEMOGLOBIN AND HEMATOCRIT, BLOOD
Hematocrit: 48.7 % (ref 37.5–51.0)
Hemoglobin: 16.9 g/dL (ref 13.0–17.7)

## 2022-02-23 LAB — TESTOSTERONE: Testosterone: 286 ng/dL (ref 264–916)

## 2022-02-25 ENCOUNTER — Other Ambulatory Visit: Payer: Self-pay | Admitting: Urology

## 2022-02-25 ENCOUNTER — Telehealth: Payer: Self-pay

## 2022-02-25 DIAGNOSIS — R972 Elevated prostate specific antigen [PSA]: Secondary | ICD-10-CM

## 2022-02-25 NOTE — Telephone Encounter (Signed)
-----  Message from Irine Seal, MD sent at 02/25/2022 10:06 AM EST ----- His PSA is up further to 5.3.   I would like to have him get a prostate MRI and then  f/u with the results to discuss further evaluation.  ----- Message ----- From: Sherrilyn Rist, CMA Sent: 02/24/2022  10:11 AM EST To: Irine Seal, MD  Please review

## 2022-02-25 NOTE — Telephone Encounter (Signed)
Made patient aware that his PSA is up to 5.3 and Dr. Jeffie Pollock will like for patient to get prostate MRI and f/u with the results to discuss further evaluation. Patient is very confused about his test results  of his PSA and he will like more understanding regarding his Testerone levels. Patient voiced that the testopel are not working as he like and has been very fatigue and is thinking that he may need to go back on his the Testerone injections. Made patient aware that I will send a message to Dr. Jeffie Pollock and once he response someone will be in touch. Patient voiced understanding

## 2022-02-28 NOTE — Telephone Encounter (Signed)
Made patient aware that his T is 15 which is below what I would expect for the testopels at this point.   We would have to see if we could get authorization to increase the number beyond 6 pellets for the next implant or if he would prefer to go back to the injections, that is fine.   Patient voiced that he would like to go back on his injections. Patient voiced understanding.

## 2022-03-01 ENCOUNTER — Telehealth: Payer: Self-pay

## 2022-03-01 ENCOUNTER — Other Ambulatory Visit: Payer: Self-pay | Admitting: Urology

## 2022-03-01 DIAGNOSIS — E230 Hypopituitarism: Secondary | ICD-10-CM

## 2022-03-01 MED ORDER — TESTOSTERONE CYPIONATE 200 MG/ML IM SOLN: 100.0000 mg | INTRAMUSCULAR | 1 refills | Status: DC

## 2022-03-01 NOTE — Telephone Encounter (Signed)
Patient's wife called requesting testosterone rx.  Returned call and informed Mr. [place] rx was sent to pharmacy.  Patient voiced understanding.

## 2022-03-01 NOTE — Addendum Note (Signed)
Addended by: Darcella Gasman R on: 03/01/2022 11:57 AM   Modules accepted: Orders

## 2022-03-01 NOTE — Telephone Encounter (Signed)
Tried calling patient with no answer.Left VM for patient to return call

## 2022-03-02 MED ORDER — "NEEDLE (DISP) 18G X 1-1/2"" MISC": 12 refills | Status: DC

## 2022-03-02 MED ORDER — "LUER LOCK SAFETY SYRINGES 22G X 1-1/2"" 3 ML MISC": 12 refills | Status: DC

## 2022-03-02 NOTE — Telephone Encounter (Signed)
Made patient aware that rx was sent in, patient voiced understanding

## 2022-03-02 NOTE — Addendum Note (Signed)
Addended by: Darcella Gasman R on: 03/02/2022 10:13 AM   Modules accepted: Orders

## 2022-03-15 DIAGNOSIS — M5417 Radiculopathy, lumbosacral region: Secondary | ICD-10-CM | POA: Diagnosis not present

## 2022-03-15 DIAGNOSIS — G894 Chronic pain syndrome: Secondary | ICD-10-CM | POA: Diagnosis not present

## 2022-03-15 DIAGNOSIS — M542 Cervicalgia: Secondary | ICD-10-CM | POA: Diagnosis not present

## 2022-03-15 DIAGNOSIS — F411 Generalized anxiety disorder: Secondary | ICD-10-CM | POA: Diagnosis not present

## 2022-04-07 ENCOUNTER — Other Ambulatory Visit: Payer: Medicare HMO

## 2022-04-07 DIAGNOSIS — N5201 Erectile dysfunction due to arterial insufficiency: Secondary | ICD-10-CM | POA: Diagnosis not present

## 2022-04-07 DIAGNOSIS — N500 Atrophy of testis: Secondary | ICD-10-CM | POA: Diagnosis not present

## 2022-04-07 DIAGNOSIS — E230 Hypopituitarism: Secondary | ICD-10-CM | POA: Diagnosis not present

## 2022-04-07 DIAGNOSIS — R972 Elevated prostate specific antigen [PSA]: Secondary | ICD-10-CM | POA: Diagnosis not present

## 2022-04-08 LAB — TESTOSTERONE: Testosterone: 726 ng/dL (ref 264–916)

## 2022-04-12 DIAGNOSIS — M542 Cervicalgia: Secondary | ICD-10-CM | POA: Diagnosis not present

## 2022-04-12 DIAGNOSIS — M5417 Radiculopathy, lumbosacral region: Secondary | ICD-10-CM | POA: Diagnosis not present

## 2022-04-12 DIAGNOSIS — G894 Chronic pain syndrome: Secondary | ICD-10-CM | POA: Diagnosis not present

## 2022-04-12 DIAGNOSIS — Z79891 Long term (current) use of opiate analgesic: Secondary | ICD-10-CM | POA: Diagnosis not present

## 2022-04-12 DIAGNOSIS — F411 Generalized anxiety disorder: Secondary | ICD-10-CM | POA: Diagnosis not present

## 2022-04-12 DIAGNOSIS — Z79899 Other long term (current) drug therapy: Secondary | ICD-10-CM | POA: Diagnosis not present

## 2022-04-14 ENCOUNTER — Ambulatory Visit: Payer: Medicare HMO | Admitting: Urology

## 2022-04-14 ENCOUNTER — Encounter: Payer: Self-pay | Admitting: Urology

## 2022-04-14 VITALS — BP 167/81 | HR 85 | Ht 72.0 in | Wt 210.0 lb

## 2022-04-14 DIAGNOSIS — E230 Hypopituitarism: Secondary | ICD-10-CM | POA: Diagnosis not present

## 2022-04-14 DIAGNOSIS — N5201 Erectile dysfunction due to arterial insufficiency: Secondary | ICD-10-CM

## 2022-04-14 DIAGNOSIS — R972 Elevated prostate specific antigen [PSA]: Secondary | ICD-10-CM

## 2022-04-14 NOTE — Progress Notes (Signed)
Subjective: 1. Hypogonadotropic hypogonadism (HCC)   2. Elevated PSA   3. Erectile dysfunction due to arterial insufficiency    04/14/22: Stephen Fuller returns today in f/u for his history of hypogonadism.  His T is 776 and his Hgb is 16.9 omn 04/07/22.  His is rising and was 4.3 with a 24.9% f/t ratio on 12/16/21 and it is now up to 5.3 with an 18.1% f/t ratio on 02/22/22.  His testosterone level was 286 on 02/22/22 2 months after the testopel and he was run down so he went back to the injectable testosterone '100mg'$  weekly.    He remains on tadalafil for ED with a good response.     01/13/22: Stephen Fuller returns today in f/u for testopel implant.  He had his labs sent from [place] from 10/28/21 and his PSA was 2.93 with a T of 664 and a Hgb of 16.3.   12/16/21: Stephen Fuller is a 67 yo male who is here to discuss testosterone replacement therapy.  He has been on injectable testosterone for over a year.  He has chronic pain and has been on oxycodone.   He was getting TRT through an MD in [place] but he can't do that anymore because of new regulations. He was getting a pill with the injections but he doesn't know what that was.  He was getting '100mg'$  weekly.  He had been doing well on therapy with a T of in the 600's.  He is on tadalafil for ED with success.   He is voiding well with with an IPSS of 0.   He last had labs done about 6 weeks ago.  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  His Cr was 1.31 on 11/24/21 and his LFT's were normal.  His GFR is 60.  ROS:  Review of Systems  Constitutional:  Negative for weight loss.  Respiratory:  Negative for shortness of breath.   Cardiovascular:  Negative for chest pain.  Musculoskeletal:  Positive for back pain and neck pain.    Allergies  Allergen Reactions   Morphine And Related Other (See Comments)    abd pain and inability to urinate    Past Medical History:  Diagnosis Date   Arthritis    Chest pain    Hepatitis C virus infection cured  after antiviral drug therapy    Hypertension    Palpitations     Past Surgical History:  Procedure Laterality Date   CHOLECYSTECTOMY  2005   COLONOSCOPY  2019   LIVER BIOPSY  2019    Social History   Socioeconomic History   Marital status: Married    Spouse name: Not on file   Number of children: 2   Years of education: Not on file   Highest education level: Not on file  Occupational History   Occupation: UNEMPLOYED  Tobacco Use   Smoking status: Every Day    Packs/day: 0.50    Years: 37.00    Total pack years: 18.50    Types: Cigarettes   Smokeless tobacco: Never   Tobacco comments:    1/2 a pack a day  Vaping Use   Vaping Use: Never used  Substance and Sexual Activity   Alcohol use: Yes    Alcohol/week: 2.0 standard drinks of alcohol    Types: 2 Standard drinks or equivalent per week    Comment: Occasional   Drug use: No   Sexual activity: Not on file  Other Topics Concern  Not on file  Social History Narrative   Not on file   Social Determinants of Health   Financial Resource Strain: Low Risk  (09/27/2021)   Overall Financial Resource Strain (CARDIA)    Difficulty of Paying Living Expenses: Not hard at all  Food Insecurity: No Food Insecurity (09/27/2021)   Hunger Vital Sign    Worried About Running Out of Food in the Last Year: Never true    Ran Out of Food in the Last Year: Never true  Transportation Needs: No Transportation Needs (09/27/2021)   PRAPARE - Hydrologist (Medical): No    Lack of Transportation (Non-Medical): No  Physical Activity: Sufficiently Active (09/27/2021)   Exercise Vital Sign    Days of Exercise per Week: 5 days    Minutes of Exercise per Session: 30 min  Stress: No Stress Concern Present (09/27/2021)   [place]    Feeling of Stress : Not at all  Social Connections: Moderately Isolated (09/27/2021)   Social Connection and Isolation  Panel [NHANES]    Frequency of Communication with Friends and Family: Twice a week    Frequency of Social Gatherings with Friends and Family: Once a week    Attends Religious Services: Never    Marine scientist or Organizations: No    Attends Archivist Meetings: Never    Marital Status: Married  Human resources officer Violence: Not At Risk (09/27/2021)   Humiliation, Afraid, Rape, and Kick questionnaire    Fear of Current or Ex-Partner: No    Emotionally Abused: No    Physically Abused: No    Sexually Abused: No    Family History  Problem Relation Age of Onset   Diabetes Mother    Stroke Mother    Hypertension Mother    Heart disease Unknown        No family history   Diabetes Brother    Cirrhosis Brother    Colon cancer Neg Hx     Anti-infectives: Anti-infectives (From admission, onward)    None       Current Outpatient Medications  Medication Sig Dispense Refill   allopurinol (ZYLOPRIM) 300 MG tablet Take 1 tablet (300 mg total) by mouth daily. 90 tablet 1   amLODipine-olmesartan (AZOR) 10-40 MG tablet Take 1 tablet by mouth daily. 90 tablet 3   aspirin EC 81 MG tablet Take 1 tablet (81 mg total) by mouth daily.     busPIRone (BUSPAR) 5 MG tablet Take by mouth.     colchicine 0.6 MG tablet TAKE 2 TABS BY MOUTH INITALLY THEN TAKE 1 TAB 1 HOUR LATER- WAIT 12 HOURS THEN TAKE 1 TAB TWICE DAILY UNTIL FLARE UP RESOLVES. 14 tablet 0   DULoxetine (CYMBALTA) 20 MG capsule Take 20 mg by mouth daily.     gabapentin (NEURONTIN) 400 MG capsule Take by mouth.     hydrochlorothiazide (HYDRODIURIL) 12.5 MG tablet Take 1/2 tablet by mouth daily 90 tablet 3   NEEDLE, DISP, 18 G 18G X 1-1/2" MISC Use to draw up testosterone for injection. 2 each 12   oxyCODONE-acetaminophen (PERCOCET) 10-325 MG tablet Take 1 tablet by mouth 4 (four) times daily as needed.     rosuvastatin (CRESTOR) 10 MG tablet TAKE 1 TABLET BY MOUTH AT BEDTIME. 30 tablet 0   SYRINGE-NEEDLE, DISP, 3 ML (LUER  LOCK SAFETY SYRINGES) 22G X 1-1/2" 3 ML MISC Use to inject testosterone 2 each 12  tadalafil (CIALIS) 20 MG tablet Take 0.5-1 tablets (10-20 mg total) by mouth every other day as needed for erectile dysfunction. 15 tablet 2   testosterone cypionate (DEPOTESTOSTERONE CYPIONATE) 200 MG/ML injection Inject 0.5 mLs (100 mg total) into the muscle every 7 (seven) days. 6 mL 1   No current facility-administered medications for this visit.     Objective: Vital signs in last 24 hours: BP (!) 167/81   Pulse 85   Ht 6' (1.829 m)   Wt 210 lb (95.3 kg)   BMI 28.48 kg/m   Intake/Output from previous day: No intake/output data recorded. Intake/Output this shift: '@IOTHISSHIFT'$ @   Physical Exam He declined a rectal exam.   Lab Results:  Results for orders placed or performed in visit on 04/14/22 (from the past 24 hour(s))  PSA, total and free     Status: None   Collection Time: 04/14/22  9:53 AM  Result Value Ref Range   Prostate Specific Ag, Serum 4.0 0.0 - 4.0 ng/mL   PSA, Free 1.01 N/A ng/mL   PSA, Free Pct 25.3 %   Narrative   Performed at:  [address] [place], [place]  [id number] Lab Director: Rush Farmer MD, Phone:  [phone]     Recent Results (from the past 2160 hour(s))  Testosterone     Status: None   Collection Time: 02/22/22  9:24 AM  Result Value Ref Range   Testosterone 286 264 - 916 ng/dL    Comment: Adult male reference interval is based on a population of healthy nonobese males (BMI <30) between 59 and 36 years old. [place], [place] [phone]. PMID: [id number].   PSA, total and free     Status: Abnormal   Collection Time: 02/22/22  9:24 AM  Result Value Ref Range   Prostate Specific Ag, Serum 5.3 (H) 0.0 - 4.0 ng/mL    Comment: Roche ECLIA methodology. According to the American Urological Association, Serum PSA should decrease and remain at undetectable levels after radical prostatectomy. The AUA defines biochemical  recurrence as an initial PSA value 0.2 ng/mL or greater followed by a subsequent confirmatory PSA value 0.2 ng/mL or greater. Values obtained with different assay methods or kits cannot be used interchangeably. Results cannot be interpreted as absolute evidence of the presence or absence of malignant disease.    PSA, Free 0.96 N/A ng/mL    Comment: Roche ECLIA methodology.   PSA, Free Pct 18.1 %    Comment: The table below lists the probability of prostate cancer for men with non-suspicious DRE results and total PSA between 4 and 10 ng/mL, by patient age Ricci Barker, [place], [id number]).                   % Free PSA       50-64 yr        65-75 yr                   0.00-10.00%        56%             55%                  10.01-15.00%        24%             35%                  15.01-20.00%        17%  23%                  20.01-25.00%        10%             20%                       >25.00%         5%              9% Please note:  Catalona et al did not make specific               recommendations regarding the use of               percent free PSA for any other population               of men.   Hemoglobin and hematocrit, blood     Status: None   Collection Time: 02/22/22  9:24 AM  Result Value Ref Range   Hemoglobin 16.9 13.0 - 17.7 g/dL   Hematocrit 48.7 37.5 - 51.0 %  Testosterone     Status: None   Collection Time: 04/07/22  9:32 AM  Result Value Ref Range   Testosterone 726 264 - 916 ng/dL    Comment: Adult male reference interval is based on a population of healthy nonobese males (BMI <30) between 32 and 56 years old. [place], [place] [phone]. PMID: [id number].   PSA, total and free     Status: None   Collection Time: 04/14/22  9:53 AM  Result Value Ref Range   Prostate Specific Ag, Serum 4.0 0.0 - 4.0 ng/mL    Comment: Roche ECLIA methodology. According to the American Urological Association, Serum PSA should decrease and remain at  undetectable levels after radical prostatectomy. The AUA defines biochemical recurrence as an initial PSA value 0.2 ng/mL or greater followed by a subsequent confirmatory PSA value 0.2 ng/mL or greater. Values obtained with different assay methods or kits cannot be used interchangeably. Results cannot be interpreted as absolute evidence of the presence or absence of malignant disease.    PSA, Free 1.01 N/A ng/mL    Comment: Roche ECLIA methodology.   PSA, Free Pct 25.3 %    Comment: The table below lists the probability of prostate cancer for men with non-suspicious DRE results and total PSA between 4 and 10 ng/mL, by patient age Ricci Barker, [place], [id number]).                   % Free PSA       50-64 yr        65-75 yr                   0.00-10.00%        56%             55%                  10.01-15.00%        24%             35%                  15.01-20.00%        17%             23%                  20.01-25.00%  10%             20%                       >25.00%         5%              9% Please note:  Catalona et al did not make specific               recommendations regarding the use of               percent free PSA for any other population               of men.     BMET No results for input(s): "NA", "K", "CL", "CO2", "GLUCOSE", "BUN", "CREATININE", "CALCIUM" in the last 72 hours. PT/INR No results for input(s): "LABPROT", "INR" in the last 72 hours. ABG No results for input(s): "PHART", "HCO3" in the last 72 hours.  Invalid input(s): "PCO2", "PO2" Labs in EPIC reviewed including CMP and CBC.  Studies/Results: No results found.    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  He will continue weekly injections and return in 6 months with labs.  He will call for a refill when needed.    ED.  He is doing well on Tadalafil.  Elevated PSA.  His PSA has been  rising and the last was up to 5.3 with 18.1% f/t ratio.  I discussed the significance of the rise and my concern that it could be associated with a prostate cancer.  He is reluctant to pursue further evaluation at this time and I explained that I would have to reconsider the testosterone therapy if he don't evaluate him further.  At this time we have agreed to check the PSA today since he hasn't been sexually active recently and if there is a further increase to consider an MRI of the prostate and ExoDx test.  (His 04/14/22 PSA returned before the note was signed and was back down to 4 with a 25% f/t ratio.  We can just continue to follow the PSA at this time)  No orders of the defined types were placed in this encounter.    Orders Placed This Encounter  Procedures   PSA, total and free   PSA, total and free    Standing Status:   Future    Standing Expiration Date:   04/14/2023   Testosterone    Standing Status:   Future    Standing Expiration Date:   04/14/2023   Hemoglobin and hematocrit, blood    Standing Status:   Future    Standing Expiration Date:   04/14/2023     Return in about 6 months (around 10/13/2022) for with labs.    CC: Alvira Monday NP.      Irine Seal 04/15/2022

## 2022-04-15 ENCOUNTER — Telehealth: Payer: Self-pay

## 2022-04-15 LAB — PSA, TOTAL AND FREE
PSA, Free Pct: 25.3 %
PSA, Free: 1.01 ng/mL
Prostate Specific Ag, Serum: 4 ng/mL (ref 0.0–4.0)

## 2022-04-15 NOTE — Telephone Encounter (Signed)
-----   Message from Irine Seal, MD sent at 04/15/2022 10:53 AM EST ----- His PSA is back down to 4 with a favorable f/t ratio.   I think we can just continue to follow him for now with repeat labs as ordered.    ----- Message ----- From: Audie Box, CMA Sent: 04/15/2022   9:02 AM EST To: Irine Seal, MD  Please review.

## 2022-04-15 NOTE — Telephone Encounter (Signed)
Patient aware of MD response and will f/u as scheduled.

## 2022-04-20 ENCOUNTER — Ambulatory Visit (INDEPENDENT_AMBULATORY_CARE_PROVIDER_SITE_OTHER): Payer: Medicare HMO | Admitting: Family Medicine

## 2022-04-20 ENCOUNTER — Encounter: Payer: Self-pay | Admitting: Family Medicine

## 2022-04-20 VITALS — BP 142/86 | HR 82 | Ht 72.0 in | Wt 219.0 lb

## 2022-04-20 DIAGNOSIS — E559 Vitamin D deficiency, unspecified: Secondary | ICD-10-CM | POA: Diagnosis not present

## 2022-04-20 DIAGNOSIS — R7301 Impaired fasting glucose: Secondary | ICD-10-CM | POA: Diagnosis not present

## 2022-04-20 DIAGNOSIS — E7849 Other hyperlipidemia: Secondary | ICD-10-CM

## 2022-04-20 DIAGNOSIS — R229 Localized swelling, mass and lump, unspecified: Secondary | ICD-10-CM | POA: Insufficient documentation

## 2022-04-20 DIAGNOSIS — H9212 Otorrhea, left ear: Secondary | ICD-10-CM | POA: Diagnosis not present

## 2022-04-20 DIAGNOSIS — H7202 Central perforation of tympanic membrane, left ear: Secondary | ICD-10-CM | POA: Diagnosis not present

## 2022-04-20 DIAGNOSIS — I10 Essential (primary) hypertension: Secondary | ICD-10-CM

## 2022-04-20 DIAGNOSIS — E038 Other specified hypothyroidism: Secondary | ICD-10-CM

## 2022-04-20 DIAGNOSIS — G479 Sleep disorder, unspecified: Secondary | ICD-10-CM | POA: Diagnosis not present

## 2022-04-20 MED ORDER — HYDROXYZINE PAMOATE 25 MG PO CAPS: 25.0000 mg | ORAL_CAPSULE | Freq: Every evening | ORAL | 0 refills | Status: DC | PRN

## 2022-04-20 NOTE — Assessment & Plan Note (Signed)
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 Melida Quitter and would like a referral to discuss surgical intervention Referral placed

## 2022-04-20 NOTE — Assessment & Plan Note (Signed)
Complains of difficulty staying asleep for 2 months No problems were reported falling asleep He gets six hours of sleep nightly Will start a trial of Vistaril 25 mg nightly as needed

## 2022-04-20 NOTE — Progress Notes (Signed)
Established Patient Office Visit  Subjective:  Patient ID: Stephen Fuller, male    DOB: [birth year]  Age: 67 y.o. MRN: [id number]  CC:  Chief Complaint  Patient presents with   Follow-up    3 month f/u, pt reports trouble sleeping for about 2 months (02/19/2022), also reports eye bump under his eye that has not gone away been there since (11/14/2021). Need to have a referral to ENT, due to his left ear having trouble hearing.    HPI Stephen Fuller is a 66 y.o. male with past medical history of hypertension, tobacco use, sleep disturbance, SOB and erectile dysfunction presents for f/u of  chronic medical conditions.   Past Medical History:  Diagnosis Date   Arthritis    Chest pain    Hepatitis C virus infection cured after antiviral drug therapy    Hypertension    Palpitations     Past Surgical History:  Procedure Laterality Date   CHOLECYSTECTOMY  2005   COLONOSCOPY  2019   LIVER BIOPSY  2019    Family History  Problem Relation Age of Onset   Diabetes Mother    Stroke Mother    Hypertension Mother    Heart disease Unknown        No family history   Diabetes Brother    Cirrhosis Brother    Colon cancer Neg Hx     Social History   Socioeconomic History   Marital status: Married    Spouse name: Not on file   Number of children: 2   Years of education: Not on file   Highest education level: Not on file  Occupational History   Occupation: UNEMPLOYED  Tobacco Use   Smoking status: Every Day    Packs/day: 0.50    Years: 37.00    Total pack years: 18.50    Types: Cigarettes   Smokeless tobacco: Never   Tobacco comments:    1/2 a pack a day  Vaping Use   Vaping Use: Never used  Substance and Sexual Activity   Alcohol use: Yes    Alcohol/week: 2.0 standard drinks of alcohol    Types: 2 Standard drinks or equivalent per week    Comment: Occasional   Drug use: No   Sexual activity: Not on file  Other Topics Concern   Not on file  Social History  Narrative   Not on file   Social Determinants of Health   Financial Resource Strain: Low Risk  (09/27/2021)   Overall Financial Resource Strain (CARDIA)    Difficulty of Paying Living Expenses: Not hard at all  Food Insecurity: No Food Insecurity (09/27/2021)   Hunger Vital Sign    Worried About Running Out of Food in the Last Year: Never true    Ran Out of Food in the Last Year: Never true  Transportation Needs: No Transportation Needs (09/27/2021)   PRAPARE - Hydrologist (Medical): No    Lack of Transportation (Non-Medical): No  Physical Activity: Sufficiently Active (09/27/2021)   Exercise Vital Sign    Days of Exercise per Week: 5 days    Minutes of Exercise per Session: 30 min  Stress: No Stress Concern Present (09/27/2021)   [place]    Feeling of Stress : Not at all  Social Connections: Moderately Isolated (09/27/2021)   Social Connection and Isolation Panel [NHANES]    Frequency of Communication with Friends and Family: Twice a week  Frequency of Social Gatherings with Friends and Family: Once a week    Attends Religious Services: Never    Marine scientist or Organizations: No    Attends Archivist Meetings: Never    Marital Status: Married  Human resources officer Violence: Not At Risk (09/27/2021)   Humiliation, Afraid, Rape, and Kick questionnaire    Fear of Current or Ex-Partner: No    Emotionally Abused: No    Physically Abused: No    Sexually Abused: No    Outpatient Medications Prior to Visit  Medication Sig Dispense Refill   allopurinol (ZYLOPRIM) 300 MG tablet Take 1 tablet (300 mg total) by mouth daily. 90 tablet 1   amLODipine-olmesartan (AZOR) 10-40 MG tablet Take 1 tablet by mouth daily. 90 tablet 3   aspirin EC 81 MG tablet Take 1 tablet (81 mg total) by mouth daily.     busPIRone (BUSPAR) 5 MG tablet Take by mouth.     colchicine 0.6 MG tablet TAKE 2  TABS BY MOUTH INITALLY THEN TAKE 1 TAB 1 HOUR LATER- WAIT 12 HOURS THEN TAKE 1 TAB TWICE DAILY UNTIL FLARE UP RESOLVES. 14 tablet 0   DULoxetine (CYMBALTA) 20 MG capsule Take 20 mg by mouth daily.     gabapentin (NEURONTIN) 400 MG capsule Take by mouth.     hydrochlorothiazide (HYDRODIURIL) 12.5 MG tablet Take 1/2 tablet by mouth daily 90 tablet 3   NEEDLE, DISP, 18 G 18G X 1-1/2" MISC Use to draw up testosterone for injection. 2 each 12   oxyCODONE-acetaminophen (PERCOCET) 10-325 MG tablet Take 1 tablet by mouth 4 (four) times daily as needed.     rosuvastatin (CRESTOR) 10 MG tablet TAKE 1 TABLET BY MOUTH AT BEDTIME. 30 tablet 0   SYRINGE-NEEDLE, DISP, 3 ML (LUER LOCK SAFETY SYRINGES) 22G X 1-1/2" 3 ML MISC Use to inject testosterone 2 each 12   tadalafil (CIALIS) 20 MG tablet Take 0.5-1 tablets (10-20 mg total) by mouth every other day as needed for erectile dysfunction. 15 tablet 2   testosterone cypionate (DEPOTESTOSTERONE CYPIONATE) 200 MG/ML injection Inject 0.5 mLs (100 mg total) into the muscle every 7 (seven) days. 6 mL 1   No facility-administered medications prior to visit.    Allergies  Allergen Reactions   Morphine And Related Other (See Comments)    abd pain and inability to urinate    ROS Review of Systems  Constitutional:  Negative for fatigue and fever.  HENT:  Negative for ear pain and hearing loss (decreased hearing in the left ear).        Eye bump under his right eye  Eyes:  Negative for visual disturbance.  Respiratory:  Negative for chest tightness and shortness of breath.   Cardiovascular:  Negative for chest pain and palpitations.  Neurological:  Negative for dizziness and headaches.      Objective:    Physical Exam HENT:     Head: Normocephalic.     Right Ear: External ear normal. No decreased hearing noted. No drainage, swelling or tenderness. There is no impacted cerumen.     Left Ear: External ear normal. Decreased hearing noted. No drainage,  swelling or tenderness. There is no impacted cerumen.     Nose: No congestion or rhinorrhea.     Mouth/Throat:     Mouth: Mucous membranes are moist.  Eyes:     Comments: Painless lump at the infraorbital region of the right side of face No redness, swelling or warmth with palpation  Cardiovascular:     Rate and Rhythm: Regular rhythm.     Heart sounds: No murmur heard. Pulmonary:     Effort: No respiratory distress.     Breath sounds: Normal breath sounds.  Neurological:     Mental Status: He is alert.     BP (!) 142/86   Pulse 82   Ht 6' (1.829 m)   Wt 219 lb (99.3 kg)   SpO2 97%   BMI 29.70 kg/m  Wt Readings from Last 3 Encounters:  04/20/22 219 lb (99.3 kg)  04/14/22 210 lb (95.3 kg)  01/24/22 210 lb 1.3 oz (95.3 kg)    Lab Results  Component Value Date   TSH 1.700 10/26/2021   Lab Results  Component Value Date   WBC 11.8 (H) 10/26/2021   HGB 16.9 02/22/2022   HCT 48.7 02/22/2022   MCV 91 10/26/2021   PLT 180 10/26/2021   Lab Results  Component Value Date   NA 143 11/24/2021   K 3.8 11/24/2021   CO2 24 11/24/2021   GLUCOSE 83 11/24/2021   BUN 24 11/24/2021   CREATININE 1.31 (H) 11/24/2021   BILITOT 0.7 10/26/2021   ALKPHOS 89 10/26/2021   AST 27 10/26/2021   ALT 35 10/26/2021   PROT 7.2 10/26/2021   ALBUMIN 5.0 (H) 12/16/2021   CALCIUM 9.9 11/24/2021   ANIONGAP 7 10/20/2017   EGFR 60 11/24/2021   Lab Results  Component Value Date   CHOL 85 (L) 10/26/2021   Lab Results  Component Value Date   HDL 45 10/26/2021   Lab Results  Component Value Date   LDLCALC 22 10/26/2021   Lab Results  Component Value Date   TRIG 95 10/26/2021   Lab Results  Component Value Date   CHOLHDL 1.9 10/26/2021   Lab Results  Component Value Date   HGBA1C 5.9 (H) 10/26/2021      Assessment & Plan:  Essential hypertension Assessment & Plan: Uncontrolled  He reports elevated BP after taking his narcotics Reports ambulatory BPs in the 130s and  80s He takes amlodipine-olmesartan 10-40 whole tablet and hydrochlorothiazide 12.5 milligrams (1/2 tablet) daily  Patient is asymptomatic today in the clinic Low-sodium diet and increase physical activity encouraged BP Readings from Last 3 Encounters:  04/20/22 (!) 142/86  04/14/22 (!) 167/81  01/24/22 138/70       Lump of skin Assessment & Plan: Onset of symptoms 3 months ago He reports a painless lump at the infraorbital region of the right side of the face No redness, swelling, or warmth with palpation Visual acuity is intact No signs of infection or inflammation noted No pus or drainage along noted Encouraged application of warm compresses 3-4 times daily as needed Will continue to follow    Tympanic membrane central perforation, left Assessment & Plan: Reports decreased hearing in the left ear with occasional clear  drainage 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 Melida Quitter and would like a referral to discuss surgical intervention Referral placed     Orders: -     Ambulatory referral to ENT  Drainage from left ear -     Ambulatory referral to ENT  Sleep disturbance Assessment & Plan: Complains of difficulty staying asleep for 2 months No problems were reported falling asleep He gets six hours of sleep nightly Will start a trial of Vistaril 25 mg nightly as needed   Orders: -  hydrOXYzine Pamoate; Take 1 capsule (25 mg total) by mouth at bedtime as needed.  Dispense: 30 capsule; Refill: 0  IFG (impaired fasting glucose) -     Hemoglobin A1c  Vitamin D deficiency -     VITAMIN D 25 Hydroxy (Vit-D Deficiency, Fractures)  Other specified hypothyroidism -     TSH + free T4  Other hyperlipidemia -     Lipid panel -     CMP14+EGFR -     CBC with Differential/Platelet    Follow-up: Return in about 3 months (around 07/21/2022).   Alvira Monday, FNP

## 2022-04-20 NOTE — Assessment & Plan Note (Signed)
Uncontrolled  He reports elevated BP after taking his narcotics Reports ambulatory BPs in the 130s and 80s He takes amlodipine-olmesartan 10-40 whole tablet and hydrochlorothiazide 12.5 milligrams (1/2 tablet) daily  Patient is asymptomatic today in the clinic Low-sodium diet and increase physical activity encouraged BP Readings from Last 3 Encounters:  04/20/22 (!) 142/86  04/14/22 (!) 167/81  01/24/22 138/70

## 2022-04-20 NOTE — Assessment & Plan Note (Signed)
Onset of symptoms 3 months ago He reports a painless lump at the infraorbital region of the right side of the face No redness, swelling, or warmth with palpation Visual acuity is intact No signs of infection or inflammation noted No pus or drainage along noted Encouraged application of warm compresses 3-4 times daily as needed Will continue to follow

## 2022-04-20 NOTE — Patient Instructions (Addendum)
I appreciate the opportunity to provide care to you today!    Follow up:  3 months  Labs: please stop by the lab today to get your blood drawn (CBC, CMP, TSH, Lipid profile, HgA1c, Vit D)  Sleep disturbances Take hydroxyzine 25 mg nightly to help you sleep  Nodule under your right eye I recommend warm compresses 3-4 times daily   Referrals today-ENT Dr Melida Quitter  [facility]  [address]  [place], [place] [address]  [phone]      Please continue to a heart-healthy diet and increase your physical activities. Try to exercise for 16mns at least five times a week.      It was a pleasure to see you and I look forward to continuing to work together on your health and well-being. Please do not hesitate to call the office if you need care or have questions about your care.   Have a wonderful day and week. With Gratitude, GAlvira MondayMSN, FNP-BC

## 2022-04-21 LAB — CBC WITH DIFFERENTIAL/PLATELET
Basophils Absolute: 0.1 10*3/uL (ref 0.0–0.2)
Basos: 1 %
EOS (ABSOLUTE): 0.3 10*3/uL (ref 0.0–0.4)
Eos: 3 %
Hematocrit: 47.1 % (ref 37.5–51.0)
Hemoglobin: 16.4 g/dL (ref 13.0–17.7)
Immature Grans (Abs): 0 10*3/uL (ref 0.0–0.1)
Immature Granulocytes: 0 %
Lymphocytes Absolute: 4.4 10*3/uL — ABNORMAL HIGH (ref 0.7–3.1)
Lymphs: 47 %
MCH: 31.2 pg (ref 26.6–33.0)
MCHC: 34.8 g/dL (ref 31.5–35.7)
MCV: 90 fL (ref 79–97)
Monocytes Absolute: 0.7 10*3/uL (ref 0.1–0.9)
Monocytes: 8 %
Neutrophils Absolute: 3.8 10*3/uL (ref 1.4–7.0)
Neutrophils: 41 %
Platelets: 248 10*3/uL (ref 150–450)
RBC: 5.25 x10E6/uL (ref 4.14–5.80)
RDW: 13.2 % (ref 11.6–15.4)
WBC: 9.1 10*3/uL (ref 3.4–10.8)

## 2022-04-21 LAB — LIPID PANEL
Chol/HDL Ratio: 3.8 ratio (ref 0.0–5.0)
Cholesterol, Total: 168 mg/dL (ref 100–199)
HDL: 44 mg/dL (ref >39)
LDL Chol Calc (NIH): 91 mg/dL (ref 0–99)
Triglycerides: 196 mg/dL — ABNORMAL HIGH (ref 0–149)
VLDL Cholesterol Cal: 33 mg/dL (ref 5–40)

## 2022-04-21 LAB — CMP14+EGFR
ALT: 39 IU/L (ref 0–44)
AST: 32 IU/L (ref 0–40)
Albumin/Globulin Ratio: 1.8 (ref 1.2–2.2)
Albumin: 4.6 g/dL (ref 3.9–4.9)
Alkaline Phosphatase: 85 IU/L (ref 44–121)
BUN/Creatinine Ratio: 13 (ref 10–24)
BUN: 16 mg/dL (ref 8–27)
Bilirubin Total: 0.3 mg/dL (ref 0.0–1.2)
CO2: 23 mmol/L (ref 20–29)
Calcium: 10 mg/dL (ref 8.6–10.2)
Chloride: 103 mmol/L (ref 96–106)
Creatinine, Ser: 1.28 mg/dL — ABNORMAL HIGH (ref 0.76–1.27)
Globulin, Total: 2.6 g/dL (ref 1.5–4.5)
Glucose: 105 mg/dL — ABNORMAL HIGH (ref 70–99)
Potassium: 4.4 mmol/L (ref 3.5–5.2)
Sodium: 140 mmol/L (ref 134–144)
Total Protein: 7.2 g/dL (ref 6.0–8.5)
eGFR: 62 mL/min/{1.73_m2} (ref >59)

## 2022-04-21 LAB — HEMOGLOBIN A1C
Est. average glucose Bld gHb Est-mCnc: 120 mg/dL
Hgb A1c MFr Bld: 5.8 % — ABNORMAL HIGH (ref 4.8–5.6)

## 2022-04-21 LAB — TSH+FREE T4
Free T4: 1.32 ng/dL (ref 0.82–1.77)
TSH: 2.2 u[IU]/mL (ref 0.450–4.500)

## 2022-04-21 LAB — VITAMIN D 25 HYDROXY (VIT D DEFICIENCY, FRACTURES): Vit D, 25-Hydroxy: 35.5 ng/mL (ref 30.0–100.0)

## 2022-04-24 NOTE — Progress Notes (Signed)
  Your triglyceride level is elevated. I recommend taking over-the-counter fish oil 2000 mg twice daily. Please increase your fluid intake to at least 64 ounces daily. Your labs indicate your labs indicate that you are prediabetic. I recommend avoiding simple carbohydrates, including cakes, sweet desserts, ice cream, soda (diet or regular), sweet tea, candies, chips, cookies, store-bought juices, alcohol in excess of 1-2 drinks a day, lemonade, artificial sweeteners, donuts, coffee creamers, and sugar-free products.  I recommend avoiding greasy, fatty foods with increased physical activity.

## 2022-04-25 ENCOUNTER — Ambulatory Visit: Payer: Medicare HMO | Admitting: Family Medicine

## 2022-05-05 ENCOUNTER — Encounter: Payer: Self-pay | Admitting: Orthopaedic Surgery

## 2022-05-05 ENCOUNTER — Ambulatory Visit (INDEPENDENT_AMBULATORY_CARE_PROVIDER_SITE_OTHER): Payer: Medicare HMO | Admitting: Orthopaedic Surgery

## 2022-05-05 ENCOUNTER — Other Ambulatory Visit: Payer: Self-pay

## 2022-05-05 VITALS — Ht 72.0 in | Wt 215.0 lb

## 2022-05-05 DIAGNOSIS — M25812 Other specified joint disorders, left shoulder: Secondary | ICD-10-CM | POA: Diagnosis not present

## 2022-05-05 DIAGNOSIS — M7541 Impingement syndrome of right shoulder: Secondary | ICD-10-CM | POA: Diagnosis not present

## 2022-05-05 MED ORDER — BUPIVACAINE HCL 0.25 % IJ SOLN
4.0000 mL | INTRAMUSCULAR | Status: AC | PRN
  Administered 2022-05-05: 4 mL via INTRA_ARTICULAR

## 2022-05-05 MED ORDER — LIDOCAINE HCL 1 % IJ SOLN
0.5000 mL | INTRAMUSCULAR | Status: AC | PRN
  Administered 2022-05-05: .5 mL

## 2022-05-05 MED ORDER — METHYLPREDNISOLONE ACETATE 40 MG/ML IJ SUSP
40.0000 mg | INTRAMUSCULAR | Status: AC | PRN
  Administered 2022-05-05: 40 mg via INTRA_ARTICULAR

## 2022-05-05 MED ORDER — TESTOSTERONE CYPIONATE 200 MG/ML IM SOLN: 100.0000 mg | INTRAMUSCULAR | 1 refills | Status: DC

## 2022-05-05 NOTE — Progress Notes (Signed)
Office Visit Note   Patient: Stephen Fuller           Date of Birth: 03/22/1955           MRN: [id number] Visit Date: 05/05/2022              Requested by: Alvira Monday, [address] [place],  [place] [address] PCP: Alvira Monday, FNP   Assessment & Plan: Visit Diagnoses:  1. Shoulder impingement, left   2. Impingement syndrome of right shoulder     Plan: Bilateral shoulder subacromial injections performed.  Follow-Up Instructions: No follow-ups on file.   Orders:  No orders of the defined types were placed in this encounter.  No orders of the defined types were placed in this encounter.     Procedures: Large Joint Inj: R subacromial bursa on 05/05/2022 11:55 AM Indications: pain Details: 22 G 1.5 in needle, lateral approach  Arthrogram: No  Medications: 4 mL bupivacaine 0.25 %; 40 mg methylPREDNISolone acetate 40 MG/ML; 0.5 mL lidocaine 1 % Outcome: tolerated well, no immediate complications Procedure, treatment alternatives, risks and benefits explained, specific risks discussed. Consent was given by the patient. Immediately prior to procedure a time out was called to verify the correct patient, procedure, equipment, support staff and site/side marked as required. Patient was prepped and draped in the usual sterile fashion.    Large Joint Inj: L subacromial bursa on 05/05/2022 11:56 AM Indications: pain Details: 22 G 1.5 in needle, lateral approach  Arthrogram: No  Medications: 4 mL bupivacaine 0.25 %; 40 mg methylPREDNISolone acetate 40 MG/ML; 0.5 mL lidocaine 1 % Outcome: tolerated well, no immediate complications Procedure, treatment alternatives, risks and benefits explained, specific risks discussed. Consent was given by the patient. Immediately prior to procedure a time out was called to verify the correct patient, procedure, equipment, support staff and site/side marked as required. Patient was prepped and draped in the usual sterile fashion.        Clinical Data: No additional findings.   Subjective: Chief Complaint  Patient presents with   Right Shoulder - Pain   Left Shoulder - Pain    HPI 67 year old male with bilateral shoulder pain some decreased range of motion difficulty getting his arm up over his head.  Previous injection by Dr.Cairns has given him relief for greater than 8 months.  He has had past history of gout.  He is able to get his arms up over his head but has some discomfort.  He denies associated neck pain.  He is on #120  monthly oxycodone 10/325 by Dr. Albertina Parr.  Review of Systems all systems noncontributory to HPI.   Objective: Vital Signs: Ht 6' (1.829 m)   Wt 215 lb (97.5 kg)   BMI 29.16 kg/m   Physical Exam Constitutional:      Appearance: He is well-developed.  HENT:     Head: Normocephalic and atraumatic.     Right Ear: External ear normal.     Left Ear: External ear normal.  Eyes:     Pupils: Pupils are equal, round, and reactive to light.  Neck:     Thyroid: No thyromegaly.     Trachea: No tracheal deviation.  Cardiovascular:     Rate and Rhythm: Normal rate.  Pulmonary:     Effort: Pulmonary effort is normal.     Breath sounds: No wheezing.  Abdominal:     General: Bowel sounds are normal.     Palpations: Abdomen is soft.  Musculoskeletal:     Cervical back: Neck supple.  Skin:    General: Skin is warm and dry.     Capillary Refill: Capillary refill takes less than 2 seconds.  Neurological:     Mental Status: He is alert and oriented to person, place, and time.  Psychiatric:        Behavior: Behavior normal.        Thought Content: Thought content normal.        Judgment: Judgment normal.     Ortho Exam positive pendulum right left shoulder long head of the biceps nontender no brachial plexus tenderness.  Station hand is intact.  Specialty Comments:  No specialty comments available.  Imaging: No results found.   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   SOB (shortness of breath) 01/28/2013   Chest pain 02/16/2012   Essential hypertension 02/16/2012   Palpitations 02/16/2012   Elevated LFTs 02/16/2012   Past Medical History:  Diagnosis Date   Arthritis    Chest pain    Hepatitis C virus infection cured after antiviral drug therapy    Hypertension    Palpitations     Family History  Problem Relation Age of Onset   Diabetes Mother    Stroke Mother    Hypertension Mother    Heart disease Unknown        No family history   Diabetes Brother    Cirrhosis Brother    Colon cancer Neg Hx     Past Surgical History:  Procedure Laterality Date   CHOLECYSTECTOMY  2005   COLONOSCOPY  2019   LIVER BIOPSY  2019   Social History   Occupational History   Occupation: UNEMPLOYED  Tobacco Use   Smoking status: Every Day    Packs/day: 0.50    Years: 37.00    Additional pack years: 0.00    Total pack years: 18.50    Types: Cigarettes   Smokeless tobacco: Never   Tobacco comments:    1/2 a pack a day  Vaping Use   Vaping Use: Never used  Substance and Sexual Activity   Alcohol use: Yes    Alcohol/week: 2.0 standard drinks of alcohol    Types: 2 Standard drinks or equivalent per week    Comment: Occasional   Drug use: No   Sexual activity: Not on file

## 2022-05-05 NOTE — Progress Notes (Signed)
OPENED IN ERROR

## 2022-05-05 NOTE — Telephone Encounter (Signed)
Patient called requesting refill.  Patient informed the way the rx is written it should have lasted 6 months. Patient states he was told by another doctor to discard vial after 1 use due to bacteria growth. Please advise

## 2022-05-10 DIAGNOSIS — G894 Chronic pain syndrome: Secondary | ICD-10-CM | POA: Diagnosis not present

## 2022-05-10 DIAGNOSIS — M5417 Radiculopathy, lumbosacral region: Secondary | ICD-10-CM | POA: Diagnosis not present

## 2022-05-10 DIAGNOSIS — M542 Cervicalgia: Secondary | ICD-10-CM | POA: Diagnosis not present

## 2022-05-10 DIAGNOSIS — M5451 Vertebrogenic low back pain: Secondary | ICD-10-CM | POA: Diagnosis not present

## 2022-05-10 DIAGNOSIS — F411 Generalized anxiety disorder: Secondary | ICD-10-CM | POA: Diagnosis not present

## 2022-05-10 DIAGNOSIS — M5010 Cervical disc disorder with radiculopathy, unspecified cervical region: Secondary | ICD-10-CM | POA: Diagnosis not present

## 2022-05-27 DIAGNOSIS — H11153 Pinguecula, bilateral: Secondary | ICD-10-CM | POA: Diagnosis not present

## 2022-05-27 DIAGNOSIS — H04123 Dry eye syndrome of bilateral lacrimal glands: Secondary | ICD-10-CM | POA: Diagnosis not present

## 2022-05-27 DIAGNOSIS — H02885 Meibomian gland dysfunction left lower eyelid: Secondary | ICD-10-CM | POA: Diagnosis not present

## 2022-05-27 DIAGNOSIS — H524 Presbyopia: Secondary | ICD-10-CM | POA: Diagnosis not present

## 2022-05-27 DIAGNOSIS — H5213 Myopia, bilateral: Secondary | ICD-10-CM | POA: Diagnosis not present

## 2022-05-27 DIAGNOSIS — H2513 Age-related nuclear cataract, bilateral: Secondary | ICD-10-CM | POA: Diagnosis not present

## 2022-05-27 DIAGNOSIS — H02883 Meibomian gland dysfunction of right eye, unspecified eyelid: Secondary | ICD-10-CM | POA: Diagnosis not present

## 2022-06-02 ENCOUNTER — Telehealth: Payer: Self-pay

## 2022-06-02 ENCOUNTER — Other Ambulatory Visit: Payer: Self-pay | Admitting: Urology

## 2022-06-02 DIAGNOSIS — H9212 Otorrhea, left ear: Secondary | ICD-10-CM | POA: Diagnosis not present

## 2022-06-02 DIAGNOSIS — H7202 Central perforation of tympanic membrane, left ear: Secondary | ICD-10-CM | POA: Diagnosis not present

## 2022-06-02 MED ORDER — "LUER LOCK SAFETY SYRINGES 25G X 1"" 3 ML MISC": 4 refills | Status: DC

## 2022-06-02 MED ORDER — TESTOSTERONE CYPIONATE 200 MG/ML IM SOLN: 100.0000 mg | INTRAMUSCULAR | 1 refills | Status: DC

## 2022-06-02 MED ORDER — "NEEDLE (DISP) 18G X 1-1/2"" MISC": 4 refills | Status: DC

## 2022-06-02 NOTE — Telephone Encounter (Signed)
Patient called to ask for testosterone prescription to be sent to [facility] in [place]. [facility] is unable to fill medication for patient.  Message sent to MD>

## 2022-06-02 NOTE — Telephone Encounter (Signed)
Patient called requesting rx for needle and syringes for testosterone injections to be sent to pharmacy

## 2022-06-07 DIAGNOSIS — F411 Generalized anxiety disorder: Secondary | ICD-10-CM | POA: Diagnosis not present

## 2022-06-07 DIAGNOSIS — M5417 Radiculopathy, lumbosacral region: Secondary | ICD-10-CM | POA: Diagnosis not present

## 2022-06-07 DIAGNOSIS — M5451 Vertebrogenic low back pain: Secondary | ICD-10-CM | POA: Diagnosis not present

## 2022-06-07 DIAGNOSIS — G894 Chronic pain syndrome: Secondary | ICD-10-CM | POA: Diagnosis not present

## 2022-06-07 DIAGNOSIS — M542 Cervicalgia: Secondary | ICD-10-CM | POA: Diagnosis not present

## 2022-06-13 DIAGNOSIS — H9212 Otorrhea, left ear: Secondary | ICD-10-CM | POA: Diagnosis not present

## 2022-06-13 DIAGNOSIS — H7112 Cholesteatoma of tympanum, left ear: Secondary | ICD-10-CM | POA: Diagnosis not present

## 2022-06-20 ENCOUNTER — Other Ambulatory Visit: Payer: Self-pay | Admitting: Family Medicine

## 2022-06-20 DIAGNOSIS — I10 Essential (primary) hypertension: Secondary | ICD-10-CM

## 2022-06-21 ENCOUNTER — Telehealth: Payer: Self-pay | Admitting: Family Medicine

## 2022-06-21 NOTE — Telephone Encounter (Signed)
TAX FORM    NOTED  COPIED SLEEVED  ORIGINAL IN PCP BOX COPY FRONT DESK FOLDER

## 2022-06-23 ENCOUNTER — Other Ambulatory Visit: Payer: Self-pay | Admitting: Family Medicine

## 2022-06-27 NOTE — Telephone Encounter (Signed)
Patient called will come back by the office must have a MD sign no NP.

## 2022-06-28 NOTE — Telephone Encounter (Signed)
Patient dropped off needs MD to sign they did not accept the NP signature.   Certification of Disability  Copied Noted sleeved

## 2022-06-29 DIAGNOSIS — H7202 Central perforation of tympanic membrane, left ear: Secondary | ICD-10-CM | POA: Diagnosis not present

## 2022-06-29 DIAGNOSIS — H7112 Cholesteatoma of tympanum, left ear: Secondary | ICD-10-CM | POA: Diagnosis not present

## 2022-06-29 DIAGNOSIS — H903 Sensorineural hearing loss, bilateral: Secondary | ICD-10-CM | POA: Diagnosis not present

## 2022-07-05 DIAGNOSIS — M5417 Radiculopathy, lumbosacral region: Secondary | ICD-10-CM | POA: Diagnosis not present

## 2022-07-05 DIAGNOSIS — M5451 Vertebrogenic low back pain: Secondary | ICD-10-CM | POA: Diagnosis not present

## 2022-07-05 DIAGNOSIS — M542 Cervicalgia: Secondary | ICD-10-CM | POA: Diagnosis not present

## 2022-07-05 DIAGNOSIS — G894 Chronic pain syndrome: Secondary | ICD-10-CM | POA: Diagnosis not present

## 2022-07-05 DIAGNOSIS — M5010 Cervical disc disorder with radiculopathy, unspecified cervical region: Secondary | ICD-10-CM | POA: Diagnosis not present

## 2022-07-05 DIAGNOSIS — Z79899 Other long term (current) drug therapy: Secondary | ICD-10-CM | POA: Diagnosis not present

## 2022-07-05 DIAGNOSIS — F411 Generalized anxiety disorder: Secondary | ICD-10-CM | POA: Diagnosis not present

## 2022-07-05 DIAGNOSIS — Z79891 Long term (current) use of opiate analgesic: Secondary | ICD-10-CM | POA: Diagnosis not present

## 2022-07-14 ENCOUNTER — Other Ambulatory Visit: Payer: Self-pay | Admitting: Family Medicine

## 2022-07-14 DIAGNOSIS — I10 Essential (primary) hypertension: Secondary | ICD-10-CM

## 2022-07-14 DIAGNOSIS — M10071 Idiopathic gout, right ankle and foot: Secondary | ICD-10-CM

## 2022-07-15 ENCOUNTER — Other Ambulatory Visit: Payer: Self-pay | Admitting: Family Medicine

## 2022-07-15 ENCOUNTER — Telehealth: Payer: Self-pay | Admitting: Family Medicine

## 2022-07-15 DIAGNOSIS — N528 Other male erectile dysfunction: Secondary | ICD-10-CM

## 2022-07-15 MED ORDER — TADALAFIL 20 MG PO TABS
ORAL_TABLET | ORAL | 0 refills | Status: DC
Start: 2022-07-15 — End: 2022-10-06

## 2022-07-15 NOTE — Telephone Encounter (Signed)
Refill sent to [facility] in [place]

## 2022-07-15 NOTE — Telephone Encounter (Signed)
Prescription Request  07/15/2022  LOV: 04/20/2022  What is the name of the medication or equipment? tadalafil (CIALIS) 20 MG tablet [[id number]]   Have you contacted your pharmacy to request a refill? Yes   Which pharmacy would you like this sent to?   Pharmacy  [facility] - [place], [place] - [address] [place], [place] [address] Phone: [phone]  Fax: [phone]    Patient notified that their request is being sent to the clinical staff for review and that they should receive a response within 2 business days.   Please advise at Mobile [phone] (mobile)

## 2022-07-22 ENCOUNTER — Ambulatory Visit: Payer: Medicare HMO | Admitting: Family Medicine

## 2022-07-25 ENCOUNTER — Ambulatory Visit: Payer: Medicare HMO | Admitting: Family Medicine

## 2022-08-01 NOTE — Progress Notes (Unsigned)
Established Patient Office Visit  Subjective:  Patient ID: Stephen Fuller, male    DOB: 10/23/1955  Age: 67 y.o. MRN: [id number]  CC: No chief complaint on file.   HPI Stephen Fuller is a 67 y.o. male with past medical history of essential hypertension, sleep disturbance, erectile dysfunction and tobacco use presents for f/u of  chronic medical conditions. For the details of today's visit, please refer to the assessment and plan.     Past Medical History:  Diagnosis Date   Arthritis    Chest pain    Hepatitis C virus infection cured after antiviral drug therapy    Hypertension    Palpitations     Past Surgical History:  Procedure Laterality Date   CHOLECYSTECTOMY  2005   COLONOSCOPY  2019   LIVER BIOPSY  2019    Family History  Problem Relation Age of Onset   Diabetes Mother    Stroke Mother    Hypertension Mother    Heart disease Unknown        No family history   Diabetes Brother    Cirrhosis Brother    Colon cancer Neg Hx     Social History   Socioeconomic History   Marital status: Married    Spouse name: Not on file   Number of children: 2   Years of education: Not on file   Highest education level: Not on file  Occupational History   Occupation: UNEMPLOYED  Tobacco Use   Smoking status: Every Day    Packs/day: 0.50    Years: 37.00    Additional pack years: 0.00    Total pack years: 18.50    Types: Cigarettes   Smokeless tobacco: Never   Tobacco comments:    1/2 a pack a day  Vaping Use   Vaping Use: Never used  Substance and Sexual Activity   Alcohol use: Yes    Alcohol/week: 2.0 standard drinks of alcohol    Types: 2 Standard drinks or equivalent per week    Comment: Occasional   Drug use: No   Sexual activity: Not on file  Other Topics Concern   Not on file  Social History Narrative   Not on file   Social Determinants of Health   Financial Resource Strain: Low Risk  (09/27/2021)   Overall Financial Resource Strain (CARDIA)     Difficulty of Paying Living Expenses: Not hard at all  Food Insecurity: No Food Insecurity (09/27/2021)   Hunger Vital Sign    Worried About Running Out of Food in the Last Year: Never true    Ran Out of Food in the Last Year: Never true  Transportation Needs: No Transportation Needs (09/27/2021)   PRAPARE - Administrator, Civil Service (Medical): No    Lack of Transportation (Non-Medical): No  Physical Activity: Sufficiently Active (09/27/2021)   Exercise Vital Sign    Days of Exercise per Week: 5 days    Minutes of Exercise per Session: 30 min  Stress: No Stress Concern Present (09/27/2021)   [employer] of Occupational Health - Occupational Stress Questionnaire    Feeling of Stress : Not at all  Social Connections: Moderately Isolated (09/27/2021)   Social Connection and Isolation Panel [NHANES]    Frequency of Communication with Friends and Family: Twice a week    Frequency of Social Gatherings with Friends and Family: Once a week    Attends Religious Services: Never    Database administrator or Organizations:  No    Attends Club or Organization Meetings: Never    Marital Status: Married  Catering manager Violence: Not At Risk (09/27/2021)   Humiliation, Afraid, Rape, and Kick questionnaire    Fear of Current or Ex-Partner: No    Emotionally Abused: No    Physically Abused: No    Sexually Abused: No    Outpatient Medications Prior to Visit  Medication Sig Dispense Refill   allopurinol (ZYLOPRIM) 300 MG tablet TAKE 1 TABLET ONCE DAILY. 30 tablet 0   amLODipine-olmesartan (AZOR) 10-40 MG tablet take 1 tablet once daily. 30 tablet 0   busPIRone (BUSPAR) 5 MG tablet Take by mouth.     colchicine 0.6 MG tablet TAKE 2 TABS BY MOUTH INITALLY THEN TAKE 1 TAB 1 HOUR LATER- WAIT 12 HOURS THEN TAKE 1 TAB TWICE DAILY UNTIL FLARE UP RESOLVES. 14 tablet 0   DULoxetine (CYMBALTA) 20 MG capsule Take 20 mg by mouth daily.     gabapentin (NEURONTIN) 400 MG capsule Take by mouth.      GOODSENSE ASPIRIN LOW DOSE 81 MG tablet take 1 tablet once daily. 30 tablet 0   hydrochlorothiazide (HYDRODIURIL) 12.5 MG tablet Take 1/2 tablet by mouth daily 90 tablet 3   hydrOXYzine (VISTARIL) 25 MG capsule Take 1 capsule (25 mg total) by mouth at bedtime as needed. 30 capsule 0   NEEDLE, DISP, 18 G 18G X 1-1/2" MISC Use to draw up testosterone for injection. 2 each 12   NEEDLE, DISP, 18 G 18G X 1-1/2" MISC Use to draw up testosterone 4 each 4   oxyCODONE-acetaminophen (PERCOCET) 10-325 MG tablet Take 1 tablet by mouth 4 (four) times daily as needed.     rosuvastatin (CRESTOR) 10 MG tablet take 1 tablet by mouth at bedtime. 30 tablet 0   SYRINGE-NEEDLE, DISP, 3 ML (LUER LOCK SAFETY SYRINGES) 22G X 1-1/2" 3 ML MISC Use to inject testosterone 2 each 12   SYRINGE-NEEDLE, DISP, 3 ML (LUER LOCK SAFETY SYRINGES) 25G X 1" 3 ML MISC Use to inject testosterone 4 each 4   tadalafil (CIALIS) 20 MG tablet TAKE 1/2 TO 1 TABLET EVERY OTHER DAY AS NEEDED FOR ERECTILE DYSFUNCTION 15 tablet 0   testosterone cypionate (DEPOTESTOSTERONE CYPIONATE) 200 MG/ML injection Inject 0.5 mLs (100 mg total) into the muscle every 7 (seven) days. 6 mL 1   No facility-administered medications prior to visit.    Allergies  Allergen Reactions   Morphine And Codeine Other (See Comments)    abd pain and inability to urinate    ROS Review of Systems    Objective:    Physical Exam  There were no vitals taken for this visit. Wt Readings from Last 3 Encounters:  05/05/22 215 lb (97.5 kg)  04/20/22 219 lb (99.3 kg)  04/14/22 210 lb (95.3 kg)    Lab Results  Component Value Date   TSH 2.200 04/20/2022   Lab Results  Component Value Date   WBC 9.1 04/20/2022   HGB 16.4 04/20/2022   HCT 47.1 04/20/2022   MCV 90 04/20/2022   PLT 248 04/20/2022   Lab Results  Component Value Date   NA 140 04/20/2022   K 4.4 04/20/2022   CO2 23 04/20/2022   GLUCOSE 105 (H) 04/20/2022   BUN 16 04/20/2022   CREATININE  1.28 (H) 04/20/2022   BILITOT 0.3 04/20/2022   ALKPHOS 85 04/20/2022   AST 32 04/20/2022   ALT 39 04/20/2022   PROT 7.2 04/20/2022   ALBUMIN 4.6 04/20/2022  CALCIUM 10.0 04/20/2022   ANIONGAP 7 10/20/2017   EGFR 62 04/20/2022   Lab Results  Component Value Date   CHOL 168 04/20/2022   Lab Results  Component Value Date   HDL 44 04/20/2022   Lab Results  Component Value Date   LDLCALC 91 04/20/2022   Lab Results  Component Value Date   TRIG 196 (H) 04/20/2022   Lab Results  Component Value Date   CHOLHDL 3.8 04/20/2022   Lab Results  Component Value Date   HGBA1C 5.8 (H) 04/20/2022      Assessment & Plan:  There are no diagnoses linked to this encounter.  Follow-up: No follow-ups on file.   [name], FNP

## 2022-08-02 ENCOUNTER — Ambulatory Visit (INDEPENDENT_AMBULATORY_CARE_PROVIDER_SITE_OTHER): Payer: Medicare HMO | Admitting: Family Medicine

## 2022-08-02 ENCOUNTER — Encounter: Payer: Self-pay | Admitting: Family Medicine

## 2022-08-02 VITALS — BP 132/72 | HR 81 | Ht 72.0 in | Wt 213.1 lb

## 2022-08-02 DIAGNOSIS — R7301 Impaired fasting glucose: Secondary | ICD-10-CM

## 2022-08-02 DIAGNOSIS — G8929 Other chronic pain: Secondary | ICD-10-CM | POA: Insufficient documentation

## 2022-08-02 DIAGNOSIS — M25532 Pain in left wrist: Secondary | ICD-10-CM

## 2022-08-02 DIAGNOSIS — E038 Other specified hypothyroidism: Secondary | ICD-10-CM | POA: Diagnosis not present

## 2022-08-02 DIAGNOSIS — M5010 Cervical disc disorder with radiculopathy, unspecified cervical region: Secondary | ICD-10-CM | POA: Diagnosis not present

## 2022-08-02 DIAGNOSIS — E7849 Other hyperlipidemia: Secondary | ICD-10-CM | POA: Diagnosis not present

## 2022-08-02 DIAGNOSIS — M5451 Vertebrogenic low back pain: Secondary | ICD-10-CM | POA: Diagnosis not present

## 2022-08-02 DIAGNOSIS — E559 Vitamin D deficiency, unspecified: Secondary | ICD-10-CM

## 2022-08-02 DIAGNOSIS — M542 Cervicalgia: Secondary | ICD-10-CM | POA: Diagnosis not present

## 2022-08-02 DIAGNOSIS — F411 Generalized anxiety disorder: Secondary | ICD-10-CM | POA: Diagnosis not present

## 2022-08-02 DIAGNOSIS — G894 Chronic pain syndrome: Secondary | ICD-10-CM | POA: Diagnosis not present

## 2022-08-02 DIAGNOSIS — I83812 Varicose veins of left lower extremities with pain: Secondary | ICD-10-CM | POA: Insufficient documentation

## 2022-08-02 DIAGNOSIS — M5417 Radiculopathy, lumbosacral region: Secondary | ICD-10-CM | POA: Diagnosis not present

## 2022-08-02 DIAGNOSIS — I10 Essential (primary) hypertension: Secondary | ICD-10-CM | POA: Diagnosis not present

## 2022-08-02 MED ORDER — DICLOFENAC SODIUM 1 % EX GEL
2.0000 g | Freq: Four times a day (QID) | CUTANEOUS | 0 refills | Status: AC
Start: 2022-08-02 — End: ?

## 2022-08-02 NOTE — Patient Instructions (Addendum)
I appreciate the opportunity to provide care to you today!    Follow up:  3 months  Labs: please stop by the lab during the week to get your blood drawn (CBC, CMP, TSH, Lipid profile, HgA1c, Vit D)  Left wrist pain  Treatment  Rest: to avoid activities that aggravate your symptoms.  Ice:You can put a cold gel pack, bag of ice, or bag of frozen vegetables on the painful or swollen area every 4 to 6 hours, for 15 minutes each time. A prescription for Voltaren cream is sent to your pharmacy to apply to the affected side  If treatment is ineffective I recommend following-up with orthopedics  Varicose veins daily elevation of 20-30 minutes above level of heart, daily compression stocking use 20-30 mmhg, exercise, weight reduction, refraining from prolonged sitting or standing.    Please report to the ED if you having the symptoms of unilateral swelling, warmth, pain and redness behind your calf as this is a sign of DVT, blood clot in your veins of your lower extremity   Please continue to a heart-healthy diet and increase your physical activities. Try to exercise for at least five days a week.      It was a pleasure to see you and I look forward to continuing to work together on your health and well-being. Please do not hesitate to call the office if you need care or have questions about your care.   Have a wonderful day and week. With Gratitude, [name] MSN, FNP-BC

## 2022-08-02 NOTE — Assessment & Plan Note (Signed)
Onset of symptoms 3 months ago  No recent injury or trauma reported No systematic symptoms noted No swelling, redness, or warmth noted ADL to use of the affected hand is intact Complains of radial wrist pain Mild tenderness reported with palpation Pain is rated today 4 out of 10 He reports using his wife's arthritis cream which relief his symptoms Will treat today symptomatically with rest, ice, and topical Voltaren gel Encouraged to follow-up with orthopedic if treatment is ineffective or if his symptoms worsens Patient verbalized understood

## 2022-08-02 NOTE — Assessment & Plan Note (Signed)
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

## 2022-08-02 NOTE — Assessment & Plan Note (Addendum)
Controlled Asymptomatic He takes amlodipine-olmesartan 10-40 mg daily and hydrochlorothiazide 12.5 mg (1/2 tablet 6.25 mg total) daily Encouraged to continue treatment regimen Low-sodium diet with increased physical activity encouraged  BP Readings from Last 3 Encounters:  08/02/22 132/72  04/20/22 (!) 142/86  04/14/22 (!) 167/81

## 2022-08-16 DIAGNOSIS — E559 Vitamin D deficiency, unspecified: Secondary | ICD-10-CM | POA: Diagnosis not present

## 2022-08-16 DIAGNOSIS — E038 Other specified hypothyroidism: Secondary | ICD-10-CM | POA: Diagnosis not present

## 2022-08-16 DIAGNOSIS — R7301 Impaired fasting glucose: Secondary | ICD-10-CM | POA: Diagnosis not present

## 2022-08-16 DIAGNOSIS — E7849 Other hyperlipidemia: Secondary | ICD-10-CM | POA: Diagnosis not present

## 2022-08-17 LAB — CBC WITH DIFFERENTIAL/PLATELET
Basophils Absolute: 0.1 10*3/uL (ref 0.0–0.2)
Basos: 1 %
EOS (ABSOLUTE): 0.3 10*3/uL (ref 0.0–0.4)
Eos: 3 %
Hematocrit: 49.6 % (ref 37.5–51.0)
Hemoglobin: 15.7 g/dL (ref 13.0–17.7)
Immature Grans (Abs): 0 10*3/uL (ref 0.0–0.1)
Immature Granulocytes: 0 %
Lymphocytes Absolute: 4 10*3/uL — ABNORMAL HIGH (ref 0.7–3.1)
Lymphs: 45 %
MCH: 28.4 pg (ref 26.6–33.0)
MCHC: 31.7 g/dL (ref 31.5–35.7)
MCV: 90 fL (ref 79–97)
Monocytes Absolute: 0.7 10*3/uL (ref 0.1–0.9)
Monocytes: 8 %
Neutrophils Absolute: 3.8 10*3/uL (ref 1.4–7.0)
Neutrophils: 43 %
Platelets: 222 10*3/uL (ref 150–450)
RBC: 5.53 x10E6/uL (ref 4.14–5.80)
RDW: 13.7 % (ref 11.6–15.4)
WBC: 8.9 10*3/uL (ref 3.4–10.8)

## 2022-08-17 LAB — CMP14+EGFR
ALT: 28 IU/L (ref 0–44)
AST: 17 IU/L (ref 0–40)
Albumin: 4.5 g/dL (ref 3.9–4.9)
Alkaline Phosphatase: 82 IU/L (ref 44–121)
BUN/Creatinine Ratio: 16 (ref 10–24)
BUN: 20 mg/dL (ref 8–27)
Bilirubin Total: 0.4 mg/dL (ref 0.0–1.2)
CO2: 23 mmol/L (ref 20–29)
Calcium: 10 mg/dL (ref 8.6–10.2)
Chloride: 102 mmol/L (ref 96–106)
Creatinine, Ser: 1.25 mg/dL (ref 0.76–1.27)
Globulin, Total: 2.5 g/dL (ref 1.5–4.5)
Glucose: 115 mg/dL — ABNORMAL HIGH (ref 70–99)
Potassium: 4.5 mmol/L (ref 3.5–5.2)
Sodium: 141 mmol/L (ref 134–144)
Total Protein: 7 g/dL (ref 6.0–8.5)
eGFR: 64 mL/min/{1.73_m2} (ref >59)

## 2022-08-17 LAB — HEMOGLOBIN A1C
Est. average glucose Bld gHb Est-mCnc: 128 mg/dL
Hgb A1c MFr Bld: 6.1 % — ABNORMAL HIGH (ref 4.8–5.6)

## 2022-08-17 LAB — LIPID PANEL
Chol/HDL Ratio: 4.7 ratio (ref 0.0–5.0)
Cholesterol, Total: 182 mg/dL (ref 100–199)
HDL: 39 mg/dL — ABNORMAL LOW (ref >39)
LDL Chol Calc (NIH): 95 mg/dL (ref 0–99)
Triglycerides: 286 mg/dL — ABNORMAL HIGH (ref 0–149)
VLDL Cholesterol Cal: 48 mg/dL — ABNORMAL HIGH (ref 5–40)

## 2022-08-17 LAB — TSH+FREE T4
Free T4: 1.17 ng/dL (ref 0.82–1.77)
TSH: 1.48 u[IU]/mL (ref 0.450–4.500)

## 2022-08-17 LAB — VITAMIN D 25 HYDROXY (VIT D DEFICIENCY, FRACTURES): Vit D, 25-Hydroxy: 37.1 ng/mL (ref 30.0–100.0)

## 2022-08-17 NOTE — Progress Notes (Signed)
Please encouraged the patient to start taking over-the-counter fish or 2000 mg twice daily for his triglyceride cholesterol which is elevated.  Please encourage the patient to continue taking rosuvastatin 10 mg daily. I recommend avoiding simple carbohydrates, including cakes, sweet desserts, ice cream, soda (diet or regular), sweet tea, candies, chips, cookies, store-bought juices, alcohol in excess of 1-2 drinks a day, lemonade, artificial sweeteners, donuts, coffee creamers, and sugar-free products.  I recommend avoiding greasy, fatty foods with increased physical activity.    His hemoglobin A1c is 6.1, this indicate that he is prediabetic.  I recommend decreasing his intake of high sugar foods and beverages with increased physical activity.

## 2022-08-19 ENCOUNTER — Other Ambulatory Visit: Payer: Self-pay | Admitting: Family Medicine

## 2022-08-19 DIAGNOSIS — I10 Essential (primary) hypertension: Secondary | ICD-10-CM

## 2022-08-19 DIAGNOSIS — M10071 Idiopathic gout, right ankle and foot: Secondary | ICD-10-CM

## 2022-08-30 DIAGNOSIS — M5451 Vertebrogenic low back pain: Secondary | ICD-10-CM | POA: Diagnosis not present

## 2022-08-30 DIAGNOSIS — Z79899 Other long term (current) drug therapy: Secondary | ICD-10-CM | POA: Diagnosis not present

## 2022-08-30 DIAGNOSIS — M5417 Radiculopathy, lumbosacral region: Secondary | ICD-10-CM | POA: Diagnosis not present

## 2022-08-30 DIAGNOSIS — G894 Chronic pain syndrome: Secondary | ICD-10-CM | POA: Diagnosis not present

## 2022-08-30 DIAGNOSIS — M542 Cervicalgia: Secondary | ICD-10-CM | POA: Diagnosis not present

## 2022-08-30 DIAGNOSIS — M5010 Cervical disc disorder with radiculopathy, unspecified cervical region: Secondary | ICD-10-CM | POA: Diagnosis not present

## 2022-08-30 DIAGNOSIS — F411 Generalized anxiety disorder: Secondary | ICD-10-CM | POA: Diagnosis not present

## 2022-08-30 DIAGNOSIS — Z79891 Long term (current) use of opiate analgesic: Secondary | ICD-10-CM | POA: Diagnosis not present

## 2022-08-31 ENCOUNTER — Other Ambulatory Visit: Payer: Self-pay | Admitting: *Deleted

## 2022-08-31 DIAGNOSIS — I83812 Varicose veins of left lower extremities with pain: Secondary | ICD-10-CM

## 2022-09-01 ENCOUNTER — Other Ambulatory Visit: Payer: Self-pay | Admitting: Urology

## 2022-09-09 DIAGNOSIS — H9212 Otorrhea, left ear: Secondary | ICD-10-CM | POA: Diagnosis not present

## 2022-09-20 ENCOUNTER — Other Ambulatory Visit: Payer: Self-pay | Admitting: Family Medicine

## 2022-09-20 DIAGNOSIS — M10071 Idiopathic gout, right ankle and foot: Secondary | ICD-10-CM

## 2022-09-20 DIAGNOSIS — I10 Essential (primary) hypertension: Secondary | ICD-10-CM

## 2022-09-21 ENCOUNTER — Ambulatory Visit: Payer: Medicare HMO | Admitting: Vascular Surgery

## 2022-09-21 ENCOUNTER — Ambulatory Visit (HOSPITAL_COMMUNITY)
Admission: RE | Admit: 2022-09-21 | Discharge: 2022-09-21 | Disposition: A | Discharge status: Home / Self Care | Payer: Medicare HMO | Source: Ambulatory Visit | Attending: Vascular Surgery | Admitting: Vascular Surgery

## 2022-09-21 ENCOUNTER — Encounter: Payer: Self-pay | Admitting: Vascular Surgery

## 2022-09-21 VITALS — BP 155/78 | HR 82 | Temp 98.2°F | Resp 18 | Ht 72.0 in | Wt 213.4 lb

## 2022-09-21 DIAGNOSIS — I70219 Atherosclerosis of native arteries of extremities with intermittent claudication, unspecified extremity: Secondary | ICD-10-CM

## 2022-09-21 DIAGNOSIS — I83812 Varicose veins of left lower extremities with pain: Secondary | ICD-10-CM | POA: Diagnosis not present

## 2022-09-21 NOTE — Progress Notes (Signed)
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  He gets bilateral lower extremity claudication that involves his hips thighs and calves.  This is brought on by ambulation and relieved with rest.  Although he has palpable dorsalis pedis pulses at rest I suspect suspect that after exercise these would not be present.  He has not had any formal arterial testing.  We have had a long discussion about the importance of tobacco cessation.  I also encouraged him to get on a structured walking program.  I explained that we would typically not consider arteriography unless he develops disabling claudication, rest pain, or nonhealing ulcer.  I have ordered follow-up ABIs in 6 months and also an aortoiliac duplex.  Of note, he thinks he has a family history of aneurysmal disease and so when he had his duplex at that time we should be able to sort that out.  I have explained that I will be retiring so he will be seen on the PA schedule at that time.  Certainly if his symptoms progress he could be scheduled for an arteriogram.  He is on aspirin and is on a statin.  REASON FOR CONSULT:    Left leg pain.  The consult is requested by [name], NP.  HPI:   Stephen Fuller is a 67 y.o. male who is referred with left leg pain.  On my history, the patient describes pain in both legs which is brought on by ambulation and relieved with rest.  This predominantly involves his calves but he also has pain in his hips and thighs bilaterally.  His symptoms are equal on both sides.  He can walk about a half a block before experiencing symptoms.  He does not experience symptoms simply with standing.  He denies any rest pain.  He denies any history of nonhealing ulcers.  He has no history of DVT.  His risk factors for peripheral arterial disease include hypertension, and tobacco use.  He smokes a half  a pack [employer] been smoking for about 40 years.  He denies any history of diabetes, hypercholesterolemia, or family history of premature cardiovascular disease.  Upon questioning him, he does state that his father had an aneurysm in his abdomen he thinks.  Past Medical History:  Diagnosis Date   Arthritis    Chest pain    Hepatitis C virus infection cured after antiviral drug therapy    Hypertension    Palpitations     Family History  Problem Relation Age of Onset   Diabetes Mother    Stroke Mother    Hypertension Mother    Heart disease Unknown        No family history   Diabetes Brother    Cirrhosis Brother    Colon cancer Neg Hx     SOCIAL HISTORY: Social History   Tobacco Use   Smoking status: Every Day    Current packs/day: 0.50    Average packs/day: 0.5 packs/day for 37.0 years (18.5 ttl pk-yrs)    Types: Cigarettes   Smokeless tobacco: Never   Tobacco comments:    1/2 a pack a day  Substance Use Topics   Alcohol use: Yes    Alcohol/week: 2.0 standard drinks of alcohol    Types: 2 Standard drinks or equivalent per week    Comment: Occasional    Allergies  Allergen Reactions   Morphine And  Codeine Other (See Comments)    abd pain and inability to urinate    Current Outpatient Medications  Medication Sig Dispense Refill   allopurinol (ZYLOPRIM) 300 MG tablet take 1 tablet once daily. 30 tablet 0   amLODipine-olmesartan (AZOR) 10-40 MG tablet take 1 tablet once daily. 30 tablet 0   busPIRone (BUSPAR) 5 MG tablet Take by mouth.     diclofenac Sodium (VOLTAREN) 1 % GEL Apply 2 g topically 4 (four) times daily. 2 g 0   gabapentin (NEURONTIN) 400 MG capsule Take by mouth.     GOODSENSE ASPIRIN LOW DOSE 81 MG tablet take 1 tablet once daily. 30 tablet 0   hydrochlorothiazide (HYDRODIURIL) 12.5 MG tablet take (1/2) tablet by mouth daily. 15 tablet 0   oxyCODONE-acetaminophen (PERCOCET) 10-325 MG tablet Take 1 tablet by mouth 4 (four) times daily as needed.      rosuvastatin (CRESTOR) 10 MG tablet take 1 tablet by mouth at bedtime. 30 tablet 0   tadalafil (CIALIS) 20 MG tablet TAKE 1/2 TO 1 TABLET EVERY OTHER DAY AS NEEDED FOR ERECTILE DYSFUNCTION 15 tablet 0   testosterone cypionate (DEPOTESTOSTERONE CYPIONATE) 200 MG/ML injection INJECT 0.5 MLS (100 MG TOTAL) INTO THE MUSCLE EVERY 7 (SEVEN) DAYS. 4 mL 2   XTAMPZA ER 13.5 MG C12A Take 1 capsule by mouth every 12 (twelve) hours.     hydrOXYzine (VISTARIL) 25 MG capsule Take 1 capsule (25 mg total) by mouth at bedtime as needed. 30 capsule 0   No current facility-administered medications for this visit.    REVIEW OF SYSTEMS:  [X]  denotes positive finding, [ ]  denotes negative finding Cardiac  Comments:  Chest pain or chest pressure:    Shortness of breath upon exertion:    Short of breath when lying flat:    Irregular heart rhythm:        Vascular    Pain in calf, thigh, or hip brought on by ambulation: x   Pain in feet at night that wakes you up from your sleep:     Blood clot in your veins:    Leg swelling:         Pulmonary    Oxygen at home:    Productive cough:     Wheezing:         Neurologic    Sudden weakness in arms or legs:     Sudden numbness in arms or legs:     Sudden onset of difficulty speaking or slurred speech:    Temporary loss of vision in one eye:     Problems with dizziness:         Gastrointestinal    Blood in stool:     Vomited blood:         Genitourinary    Burning when urinating:     Blood in urine:        Psychiatric    Major depression:         Hematologic    Bleeding problems:    Problems with blood clotting too easily:        Skin    Rashes or ulcers:        Constitutional    Fever or chills:    -  PHYSICAL EXAM:   Vitals:   09/21/22 1405  BP: (!) 155/78  Pulse: 82  Resp: 18  Temp: 98.2 F (36.8 C)  TempSrc: Temporal  SpO2: 99%  Weight: 213 lb 6.4 oz (96.8 kg)  Height: 6' (1.829  m)   Body mass index is 28.94 kg/m. GENERAL:  The patient is a well-nourished male, in no acute distress. The vital signs are documented above. CARDIAC: There is a regular rate and rhythm.  VASCULAR: I do not detect carotid bruits. He has slightly diminished femoral pulses. He has slightly diminished but palpable dorsalis pedis pulses. On the right side he has a biphasic dorsalis pedis signal and a brisk but monophasic posterior tibial signal. On the left side, he has a dampened monophasic posterior tibial signal and a fairly brisk but monophasic dorsalis pedis signal. He has no significant lower extremity swelling.  He has no hyperpigmentation. PULMONARY: There is good air exchange bilaterally without wheezing or rales. ABDOMEN: Soft and non-tender with normal pitched bowel sounds.  I do not palpate an abdominal aortic aneurysm. MUSCULOSKELETAL: There are no major deformities. NEUROLOGIC: No focal weakness or paresthesias are detected. SKIN: There are no ulcers or rashes noted. PSYCHIATRIC: The patient has a normal affect.  DATA:    VENOUS DUPLEX: I have independently interpreted his venous duplex scan today.  This was of the left lower extremity only.  There was no evidence of DVT.  There was no deep venous reflux.  There was no superficial venous reflux.  Waverly Ferrari [facility]

## 2022-09-26 ENCOUNTER — Other Ambulatory Visit: Payer: Medicare HMO

## 2022-09-26 DIAGNOSIS — R972 Elevated prostate specific antigen [PSA]: Secondary | ICD-10-CM

## 2022-09-26 DIAGNOSIS — E230 Hypopituitarism: Secondary | ICD-10-CM

## 2022-09-28 ENCOUNTER — Other Ambulatory Visit: Payer: Self-pay

## 2022-09-28 DIAGNOSIS — I70219 Atherosclerosis of native arteries of extremities with intermittent claudication, unspecified extremity: Secondary | ICD-10-CM

## 2022-09-29 ENCOUNTER — Other Ambulatory Visit: Payer: Medicare HMO

## 2022-09-29 DIAGNOSIS — H7112 Cholesteatoma of tympanum, left ear: Secondary | ICD-10-CM | POA: Diagnosis not present

## 2022-09-30 ENCOUNTER — Encounter (HOSPITAL_COMMUNITY): Payer: Medicare HMO

## 2022-09-30 ENCOUNTER — Encounter: Payer: Medicare HMO | Admitting: Vascular Surgery

## 2022-10-06 ENCOUNTER — Encounter: Payer: Self-pay | Admitting: Urology

## 2022-10-06 ENCOUNTER — Ambulatory Visit: Payer: Medicare HMO | Admitting: Urology

## 2022-10-06 VITALS — BP 155/68 | HR 102

## 2022-10-06 DIAGNOSIS — R972 Elevated prostate specific antigen [PSA]: Secondary | ICD-10-CM | POA: Diagnosis not present

## 2022-10-06 DIAGNOSIS — E230 Hypopituitarism: Secondary | ICD-10-CM

## 2022-10-06 DIAGNOSIS — N5201 Erectile dysfunction due to arterial insufficiency: Secondary | ICD-10-CM | POA: Diagnosis not present

## 2022-10-06 DIAGNOSIS — N528 Other male erectile dysfunction: Secondary | ICD-10-CM

## 2022-10-06 LAB — URINALYSIS, ROUTINE W REFLEX MICROSCOPIC
Bilirubin, UA: NEGATIVE
Glucose, UA: NEGATIVE
Ketones, UA: NEGATIVE
Leukocytes,UA: NEGATIVE
Nitrite, UA: NEGATIVE
Protein,UA: NEGATIVE
RBC, UA: NEGATIVE
Specific Gravity, UA: 1.02 (ref 1.005–1.030)
Urobilinogen, Ur: 0.2 mg/dL (ref 0.2–1.0)
pH, UA: 6 (ref 5.0–7.5)

## 2022-10-06 MED ORDER — TESTOSTERONE CYPIONATE 200 MG/ML IM SOLN: 100.0000 mg | INTRAMUSCULAR | 2 refills | Status: DC

## 2022-10-06 MED ORDER — TADALAFIL 20 MG PO TABS
ORAL_TABLET | ORAL | 11 refills | Status: DC
Start: 2022-10-06 — End: 2023-02-14

## 2022-10-06 NOTE — Progress Notes (Signed)
Subjective: 1. Elevated PSA   2. Hypogonadotropic hypogonadism (HCC)   3. Erectile dysfunction due to arterial insufficiency   4. Other male erectile dysfunction    10/06/22: Stephen Fuller returns today for his history below. He remains on 100mg  T weekly and his testosterone level is 420 with a Hgb of 15.7 and a PSA of 4.3 with a 27% f/t ratio.  His energy level is good.  He has a good response to the tadalafil.  His UA is clear today.    04/14/22: Stephen Fuller returns today in f/u for his history of hypogonadism.  His T is 776 and his Hgb is 16.9 omn 04/07/22.  His is rising and was 4.3 with a 24.9% f/t ratio on 12/16/21 and it is now up to 5.3 with an 18.1% f/t ratio on 02/22/22.  His testosterone level was 286 on 02/22/22 2 months after the testopel and he was run down so he went back to the injectable testosterone 100mg  weekly.    He remains on tadalafil for ED with a good response.     01/13/22: Stephen Fuller returns today in f/u for testopel implant.  He had his labs sent from [place] from 10/28/21 and his PSA was 2.93 with a T of 664 and a Hgb of 16.3.   12/16/21: Stephen Fuller is a 67 yo male who is here to discuss testosterone replacement therapy.  He has been on injectable testosterone for over a year.  He has chronic pain and has been on oxycodone.   He was getting TRT through an MD in [place] but he can't do that anymore because of new regulations. He was getting a pill with the injections but he doesn't know what that was.  He was getting 100mg  weekly.  He had been doing well on therapy with a T of in the 600's.  He is on tadalafil for ED with success.   He is voiding well with with an IPSS of 0.   He last had labs done about 6 weeks ago.  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  His Cr was 1.31 on 11/24/21 and his LFT's were normal.  His GFR is 60.  ROS:  Review of Systems  Constitutional:  Negative for weight loss.  Respiratory:  Negative for shortness of breath.   Cardiovascular:   Negative for chest pain.  Musculoskeletal:  Positive for back pain and neck pain.    Allergies  Allergen Reactions   Morphine And Codeine Other (See Comments)    abd pain and inability to urinate    Past Medical History:  Diagnosis Date   Arthritis    Chest pain    Hepatitis C virus infection cured after antiviral drug therapy    Hypertension    Palpitations     Past Surgical History:  Procedure Laterality Date   CHOLECYSTECTOMY  2005   COLONOSCOPY  2019   LIVER BIOPSY  2019    Social History   Socioeconomic History   Marital status: Married    Spouse name: Not on file   Number of children: 2   Years of education: Not on file   Highest education level: Not on file  Occupational History   Occupation: UNEMPLOYED  Tobacco Use   Smoking status: Every Day    Current packs/day: 0.50    Average packs/day: 0.5 packs/day for 37.0 years (18.5 ttl pk-yrs)    Types: Cigarettes   Smokeless tobacco: Never   Tobacco comments:  1/2 a pack a day  Vaping Use   Vaping status: Never Used  Substance and Sexual Activity   Alcohol use: Yes    Alcohol/week: 2.0 standard drinks of alcohol    Types: 2 Standard drinks or equivalent per week    Comment: Occasional   Drug use: No   Sexual activity: Not on file  Other Topics Concern   Not on file  Social History Narrative   Not on file   Social Determinants of Health   Financial Resource Strain: Low Risk  (09/27/2021)   Overall Financial Resource Strain (CARDIA)    Difficulty of Paying Living Expenses: Not hard at all  Food Insecurity: Low Risk  (09/09/2022)   Received from [facility]   Food vital sign    Within the past 12 months, you worried that your food would run out before you got money to buy more: Never true    Within the past 12 months, the food you bought just didn't last and you didn't have money to get more. : Never true  Transportation Needs: Not on file (09/09/2022)  Physical Activity: Sufficiently Active  (09/27/2021)   Exercise Vital Sign    Days of Exercise per Week: 5 days    Minutes of Exercise per Session: 30 min  Stress: No Stress Concern Present (09/27/2021)   [employer] of Occupational Health - Occupational Stress Questionnaire    Feeling of Stress : Not at all  Social Connections: Moderately Isolated (09/27/2021)   Social Connection and Isolation Panel [NHANES]    Frequency of Communication with Friends and Family: Twice a week    Frequency of Social Gatherings with Friends and Family: Once a week    Attends Religious Services: Never    Database administrator or Organizations: No    Attends Banker Meetings: Never    Marital Status: Married  Catering manager Violence: Not At Risk (09/27/2021)   Humiliation, Afraid, Rape, and Kick questionnaire    Fear of Current or Ex-Partner: No    Emotionally Abused: No    Physically Abused: No    Sexually Abused: No    Family History  Problem Relation Age of Onset   Diabetes Mother    Stroke Mother    Hypertension Mother    Heart disease Unknown        No family history   Diabetes Brother    Cirrhosis Brother    Colon cancer Neg Hx     Anti-infectives: Anti-infectives (From admission, onward)    None       Current Outpatient Medications  Medication Sig Dispense Refill   allopurinol (ZYLOPRIM) 300 MG tablet take 1 tablet once daily. 30 tablet 0   amLODipine-olmesartan (AZOR) 10-40 MG tablet take 1 tablet once daily. 30 tablet 0   busPIRone (BUSPAR) 5 MG tablet Take by mouth.     diclofenac Sodium (VOLTAREN) 1 % GEL Apply 2 g topically 4 (four) times daily. 2 g 0   gabapentin (NEURONTIN) 400 MG capsule Take by mouth.     GOODSENSE ASPIRIN LOW DOSE 81 MG tablet take 1 tablet once daily. 30 tablet 0   hydrochlorothiazide (HYDRODIURIL) 12.5 MG tablet take (1/2) tablet by mouth daily. 15 tablet 0   oxyCODONE-acetaminophen (PERCOCET) 10-325 MG tablet Take 1 tablet by mouth 4 (four) times daily as needed.      rosuvastatin (CRESTOR) 10 MG tablet take 1 tablet by mouth at bedtime. 30 tablet 0   tadalafil (CIALIS) 20 MG tablet  TAKE 1/2 TO 1 TABLET EVERY OTHER DAY AS NEEDED FOR ERECTILE DYSFUNCTION 15 tablet 11   testosterone cypionate (DEPOTESTOSTERONE CYPIONATE) 200 MG/ML injection Inject 0.5 mLs (100 mg total) into the muscle every 7 (seven) days. 4 mL 2   XTAMPZA ER 13.5 MG C12A Take 1 capsule by mouth every 12 (twelve) hours.     No current facility-administered medications for this visit.     Objective: Vital signs in last 24 hours: BP (!) 155/68   Pulse (!) 102   Intake/Output from previous day: No intake/output data recorded. Intake/Output this shift: @IOTHISSHIFT @   Physical Exam He declined a rectal exam.   Lab Results:  Results for orders placed or performed in visit on 10/06/22 (from the past 24 hour(s))  Urinalysis, Routine w reflex microscopic     Status: None   Collection Time: 10/06/22  9:34 AM  Result Value Ref Range   Specific Gravity, UA 1.020 1.005 - 1.030   pH, UA 6.0 5.0 - 7.5   Color, UA Yellow Yellow   Appearance Ur Clear Clear   Leukocytes,UA Negative Negative   Protein,UA Negative Negative/Trace   Glucose, UA Negative Negative   Ketones, UA Negative Negative   RBC, UA Negative Negative   Bilirubin, UA Negative Negative   Urobilinogen, Ur 0.2 0.2 - 1.0 mg/dL   Nitrite, UA Negative Negative   Microscopic Examination Comment    Narrative   Performed at:  [address] - [facility] [address], [place], [place]  [id number] Lab Director: Chinita Pester MT, Phone:  [phone]      Recent Results (from the past 2160 hour(s))  Hemoglobin A1c     Status: Abnormal   Collection Time: 08/16/22  9:28 AM  Result Value Ref Range   Hgb A1c MFr Bld 6.1 (H) 4.8 - 5.6 %    Comment:          Prediabetes: 5.7 - 6.4          Diabetes: >6.4          Glycemic control for adults with diabetes: <7.0    Est. average glucose Bld gHb Est-mCnc 128 mg/dL  VITAMIN D 25  Hydroxy (Vit-D Deficiency, Fractures)     Status: None   Collection Time: 08/16/22  9:28 AM  Result Value Ref Range   Vit D, 25-Hydroxy 37.1 30.0 - 100.0 ng/mL    Comment: Vitamin D deficiency has been defined by the Institute of Medicine and an Endocrine Society practice guideline as a level of serum 25-OH vitamin D less than 20 ng/mL (1,2). The Endocrine Society went on to further define vitamin D insufficiency as a level between 21 and 29 ng/mL (2). 1. IOM (Institute of Medicine). 2010. Dietary reference    intakes for calcium and D. [place]: The    [employer]. 2. Holick MF, [address], Bischoff-Ferrari HA, et al.    Evaluation, treatment, and prevention of vitamin D    deficiency: an Endocrine Society clinical practice    guideline. JCEM. 2011 Jul; 96(7):1911-30.   TSH + free T4     Status: None   Collection Time: 08/16/22  9:28 AM  Result Value Ref Range   TSH 1.480 0.450 - 4.500 uIU/mL   Free T4 1.17 0.82 - 1.77 ng/dL  Lipid panel     Status: Abnormal   Collection Time: 08/16/22  9:28 AM  Result Value Ref Range   Cholesterol, Total 182 100 - 199 mg/dL   Triglycerides 387 (H) 0 -  149 mg/dL   HDL 39 (L) >69 mg/dL   VLDL Cholesterol Cal 48 (H) 5 - 40 mg/dL   LDL Chol Calc (NIH) 95 0 - 99 mg/dL   Chol/HDL Ratio 4.7 0.0 - 5.0 ratio    Comment:                                   T. Chol/HDL Ratio                                             Men  Women                               1/2 Avg.Risk  3.4    3.3                                   Avg.Risk  5.0    4.4                                2X Avg.Risk  9.6    7.1                                3X Avg.Risk 23.4   11.0   CMP14+EGFR     Status: Abnormal   Collection Time: 08/16/22  9:28 AM  Result Value Ref Range   Glucose 115 (H) 70 - 99 mg/dL   BUN 20 8 - 27 mg/dL   Creatinine, Ser 6.29 0.76 - 1.27 mg/dL   eGFR 64 >52 WU/XLK/4.40   BUN/Creatinine Ratio 16 10 - 24   Sodium 141 134 - 144 mmol/L    Potassium 4.5 3.5 - 5.2 mmol/L   Chloride 102 96 - 106 mmol/L   CO2 23 20 - 29 mmol/L   Calcium 10.0 8.6 - 10.2 mg/dL   Total Protein 7.0 6.0 - 8.5 g/dL   Albumin 4.5 3.9 - 4.9 g/dL   Globulin, Total 2.5 1.5 - 4.5 g/dL   Bilirubin Total 0.4 0.0 - 1.2 mg/dL   Alkaline Phosphatase 82 44 - 121 IU/L   AST 17 0 - 40 IU/L   ALT 28 0 - 44 IU/L  CBC with Differential/Platelet     Status: Abnormal   Collection Time: 08/16/22  9:28 AM  Result Value Ref Range   WBC 8.9 3.4 - 10.8 x10E3/uL   RBC 5.53 4.14 - 5.80 x10E6/uL   Hemoglobin 15.7 13.0 - 17.7 g/dL   Hematocrit 10.2 72.5 - 51.0 %   MCV 90 79 - 97 fL   MCH 28.4 26.6 - 33.0 pg   MCHC 31.7 31.5 - 35.7 g/dL   RDW 36.6 44.0 - 34.7 %   Platelets 222 150 - 450 x10E3/uL   Neutrophils 43 Not Estab. %   Lymphs 45 Not Estab. %   Monocytes 8 Not Estab. %   Eos 3 Not Estab. %   Basos 1 Not Estab. %   Neutrophils Absolute 3.8 1.4 - 7.0 x10E3/uL   Lymphocytes Absolute 4.0 (H) 0.7 - 3.1 x10E3/uL   Monocytes Absolute 0.7  0.1 - 0.9 x10E3/uL   EOS (ABSOLUTE) 0.3 0.0 - 0.4 x10E3/uL   Basophils Absolute 0.1 0.0 - 0.2 x10E3/uL   Immature Granulocytes 0 Not Estab. %   Immature Grans (Abs) 0.0 0.0 - 0.1 x10E3/uL  Hemoglobin and hematocrit, blood     Status: None   Collection Time: 09/26/22 10:22 AM  Result Value Ref Range   Hemoglobin 15.7 13.0 - 17.7 g/dL   Hematocrit 81.1 91.4 - 51.0 %  Testosterone     Status: None   Collection Time: 09/26/22 10:22 AM  Result Value Ref Range   Testosterone 420 264 - 916 ng/dL    Comment: Adult male reference interval is based on a population of healthy nonobese males (BMI <30) between 54 and 50 years old. Travison, et.al. JCEM [phone]. PMID: [id number].   PSA, total and free     Status: Abnormal   Collection Time: 09/26/22 10:22 AM  Result Value Ref Range   Prostate Specific Ag, Serum 4.3 (H) 0.0 - 4.0 ng/mL    Comment: Roche ECLIA methodology. According to the American Urological Association,  Serum PSA should decrease and remain at undetectable levels after radical prostatectomy. The AUA defines biochemical recurrence as an initial PSA value 0.2 ng/mL or greater followed by a subsequent confirmatory PSA value 0.2 ng/mL or greater. Values obtained with different assay methods or kits cannot be used interchangeably. Results cannot be interpreted as absolute evidence of the presence or absence of malignant disease.    PSA, Free 1.18 N/A ng/mL    Comment: Roche ECLIA methodology.   PSA, Free Pct 27.4 %    Comment: The table below lists the probability of prostate cancer for men with non-suspicious DRE results and total PSA between 4 and 10 ng/mL, by patient age Damaris Schooner, JAMA 1998, [id number]).                   % Free PSA       50-64 yr        65-75 yr                   0.00-10.00%        56%             55%                  10.01-15.00%        24%             35%                  15.01-20.00%        17%             23%                  20.01-25.00%        10%             20%                       >25.00%         5%              9% Please note:  Catalona et al did not make specific               recommendations regarding the use of               percent free PSA for any other  population               of men.   Urinalysis, Routine w reflex microscopic     Status: None   Collection Time: 10/06/22  9:34 AM  Result Value Ref Range   Specific Gravity, UA 1.020 1.005 - 1.030   pH, UA 6.0 5.0 - 7.5   Color, UA Yellow Yellow   Appearance Ur Clear Clear   Leukocytes,UA Negative Negative   Protein,UA Negative Negative/Trace   Glucose, UA Negative Negative   Ketones, UA Negative Negative   RBC, UA Negative Negative   Bilirubin, UA Negative Negative   Urobilinogen, Ur 0.2 0.2 - 1.0 mg/dL   Nitrite, UA Negative Negative   Microscopic Examination Comment     Comment: Microscopic not indicated and not performed.    BMET No results for input(s): "NA", "K", "CL", "CO2",  "GLUCOSE", "BUN", "CREATININE", "CALCIUM" in the last 72 hours. PT/INR No results for input(s): "LABPROT", "INR" in the last 72 hours. ABG No results for input(s): "PHART", "HCO3" in the last 72 hours.  Invalid input(s): "PCO2", "PO2" Labs in EPIC reviewed including CMP and CBC.  Studies/Results: No results found.    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  He will continue weekly injections and return in 6 months with labs.     ED.  He is doing well on Tadalafil.  Elevated PSA.  His PSA is down from the high at 4.3 with a 27% f/t  ratio.  Meds ordered this encounter  Medications   testosterone cypionate (DEPOTESTOSTERONE CYPIONATE) 200 MG/ML injection    Sig: Inject 0.5 mLs (100 mg total) into the muscle every 7 (seven) days.    Dispense:  4 mL    Refill:  2    Not to exceed 4 additional fills before 11/29/2022   tadalafil (CIALIS) 20 MG tablet    Sig: TAKE 1/2 TO 1 TABLET EVERY OTHER DAY AS NEEDED FOR ERECTILE DYSFUNCTION    Dispense:  15 tablet    Refill:  11    NA     Orders Placed This Encounter  Procedures   Urinalysis, Routine w reflex microscopic   PSA, total and free    Standing Status:   Future    Standing Expiration Date:   10/06/2023   Testosterone    Standing Status:   Future    Standing Expiration Date:   10/06/2023   Hemoglobin and hematocrit, blood    Standing Status:   Future    Standing Expiration Date:   10/06/2023     Return in about 6 months (around 04/08/2023) for with labs. .    CC: [name] NP.      Bjorn Pippin 10/07/2022

## 2022-10-14 ENCOUNTER — Other Ambulatory Visit: Payer: Self-pay | Admitting: Family Medicine

## 2022-10-14 DIAGNOSIS — I10 Essential (primary) hypertension: Secondary | ICD-10-CM

## 2022-10-14 DIAGNOSIS — M10071 Idiopathic gout, right ankle and foot: Secondary | ICD-10-CM

## 2022-10-24 DIAGNOSIS — H903 Sensorineural hearing loss, bilateral: Secondary | ICD-10-CM | POA: Diagnosis not present

## 2022-10-25 DIAGNOSIS — M5451 Vertebrogenic low back pain: Secondary | ICD-10-CM | POA: Diagnosis not present

## 2022-10-25 DIAGNOSIS — G894 Chronic pain syndrome: Secondary | ICD-10-CM | POA: Diagnosis not present

## 2022-10-25 DIAGNOSIS — M5010 Cervical disc disorder with radiculopathy, unspecified cervical region: Secondary | ICD-10-CM | POA: Diagnosis not present

## 2022-10-25 DIAGNOSIS — Z79899 Other long term (current) drug therapy: Secondary | ICD-10-CM | POA: Diagnosis not present

## 2022-10-25 DIAGNOSIS — M5417 Radiculopathy, lumbosacral region: Secondary | ICD-10-CM | POA: Diagnosis not present

## 2022-10-25 DIAGNOSIS — Z79891 Long term (current) use of opiate analgesic: Secondary | ICD-10-CM | POA: Diagnosis not present

## 2022-10-25 DIAGNOSIS — F411 Generalized anxiety disorder: Secondary | ICD-10-CM | POA: Diagnosis not present

## 2022-10-25 DIAGNOSIS — M542 Cervicalgia: Secondary | ICD-10-CM | POA: Diagnosis not present

## 2022-11-02 ENCOUNTER — Ambulatory Visit (INDEPENDENT_AMBULATORY_CARE_PROVIDER_SITE_OTHER): Payer: Medicare HMO | Admitting: Family Medicine

## 2022-11-02 ENCOUNTER — Encounter: Payer: Self-pay | Admitting: Family Medicine

## 2022-11-02 VITALS — BP 138/82 | HR 83 | Ht 72.0 in | Wt 220.0 lb

## 2022-11-02 DIAGNOSIS — E7849 Other hyperlipidemia: Secondary | ICD-10-CM | POA: Diagnosis not present

## 2022-11-02 DIAGNOSIS — I10 Essential (primary) hypertension: Secondary | ICD-10-CM

## 2022-11-02 DIAGNOSIS — K047 Periapical abscess without sinus: Secondary | ICD-10-CM | POA: Diagnosis not present

## 2022-11-02 DIAGNOSIS — E559 Vitamin D deficiency, unspecified: Secondary | ICD-10-CM | POA: Diagnosis not present

## 2022-11-02 DIAGNOSIS — E785 Hyperlipidemia, unspecified: Secondary | ICD-10-CM | POA: Insufficient documentation

## 2022-11-02 DIAGNOSIS — R7301 Impaired fasting glucose: Secondary | ICD-10-CM | POA: Diagnosis not present

## 2022-11-02 DIAGNOSIS — E038 Other specified hypothyroidism: Secondary | ICD-10-CM

## 2022-11-02 MED ORDER — AMOXICILLIN-POT CLAVULANATE 875-125 MG PO TABS
1.0000 | ORAL_TABLET | Freq: Two times a day (BID) | ORAL | 0 refills | Status: AC
Start: 2022-11-02 — End: 2022-11-09

## 2022-11-02 NOTE — Patient Instructions (Addendum)
I appreciate the opportunity to provide care to you today!    Follow up:  4 months  Labs: please stop by the lab today to get your blood drawn (CBC, CMP, TSH, Lipid profile, HgA1c, Vit D)   Dental Abscess:  Start taking Augmentin twice daily for 7 days. I recommend taking Percocet as needed for pain relief. Apply ice to the affected area for 15 to 20 minutes to help reduce swelling and pain.   Here are some foods to avoid or reduce in your diet to help manage cholesterol levels:  Fried Foods:Deep-fried items such as french fries, fried chicken, and fried snacks are high in unhealthy fats and can raise LDL (bad) cholesterol levels. Processed Meats:Foods like bacon, sausage, hot dogs, and deli meats are often high in saturated fat and cholesterol. Full-Fat Dairy Products:Whole milk, full-fat yogurt, butter, cream, and cheese are rich in saturated fats, which can increase cholesterol levels. Baked Goods and Sweets:Pastries, cakes, cookies, and donuts often contain trans fats and added sugars, which can raise LDL cholesterol and lower HDL (good) cholesterol. Red Meat:Beef, lamb, and pork are high in saturated fat. Lean cuts or plant-based protein alternatives are better options. Lard and Shortening:Used in some baked goods, lard and shortening are high in trans fats and should be avoided. Fast Food:Many fast food items are cooked with unhealthy oils and contain high amounts of saturated and trans fats. Processed Snacks:Chips, crackers, and certain microwave popcorns can contain trans fats and high levels of unhealthy oils. Shellfish:While nutritious in other ways, some shellfish like shrimp, lobster, and crab are high in cholesterol. They should be consumed in moderation. Coconut and Palm Oils:these oils are high in saturated fat and can raise cholesterol levels when used in cooking or baking.    For managing prediabetes, I recommend the following lifestyle changes:  Reduce Intake of  High-Sugar Foods and Beverages: Limit foods and drinks high in sugar to help regulate blood sugar levels. Increase Consumption of Nutrient-Rich Foods: Focus on incorporating more fruits, vegetables, and whole grains into your diet. Choose Lean Proteins: Opt for lean proteins such as chicken, fish, beans, and legumes. Select Low-Fat Dairy Products: Choose low-fat or non-fat dairy options. Minimize Saturated Fats, Trans Fats, and Cholesterol: Reduce intake of foods high in saturated fats, trans fatty acids, and cholesterol. Engage in Regular Physical Activity: Aim for at least 30 minutes of brisk walking or other moderate activity at least 5 days a week.     Please continue to a heart-healthy diet and increase your physical activities. Try to exercise for at least five days a week.    It was a pleasure to see you and I look forward to continuing to work together on your health and well-being. Please do not hesitate to call the office if you need care or have questions about your care.  In case of emergency, please visit the Emergency Department for urgent care, or contact our clinic at [phone] to schedule an appointment. We're here to help you!   Have a wonderful day and week. With Gratitude, [name] MSN, FNP-BC

## 2022-11-02 NOTE — Progress Notes (Signed)
Established Patient Office Visit  Subjective:  Patient ID: Stephen Fuller, male    DOB: [birth year]  Age: 68 y.o. MRN: [id number]  CC:  Chief Complaint  Patient presents with   Care Management    3 month f/u   Dental Pain    Pt reports abscess on tooth, started yesterday , pain is a 9/10    HPI Stephen Fuller is a 67 y.o. male with past medical history of hypertension, hyperlipidemia presents for f/u of  chronic medical conditions.  Hypertension: The patient takes amlodipine-olmesartan 10-40 mg daily and hydrochlorothiazide 12.5 mg (1/2 tablet for a total of 6.25 mg) daily. He reports compliance with his treatment regimen and denies symptoms of chest pain, palpitations, or shortness of breath.  Hyperlipidemia: The patient has not been taking rosuvastatin 10 mg for about a year, as he was advised by his testosterone provider to discontinue its use.  Dental Pain: The patient rates his pain as 9 out of 10 and reports facial swelling on the affected side, with pain while chewing and swallowing. He denies fever or chills.    Past Medical History:  Diagnosis Date   Arthritis    Chest pain    Hepatitis C virus infection cured after antiviral drug therapy    Hypertension    Palpitations     Past Surgical History:  Procedure Laterality Date   CHOLECYSTECTOMY  2005   COLONOSCOPY  2019   LIVER BIOPSY  2019    Family History  Problem Relation Age of Onset   Diabetes Mother    Stroke Mother    Hypertension Mother    Heart disease Unknown        No family history   Diabetes Brother    Cirrhosis Brother    Colon cancer Neg Hx     Social History   Socioeconomic History   Marital status: Married    Spouse name: Not on file   Number of children: 2   Years of education: Not on file   Highest education level: Not on file  Occupational History   Occupation: UNEMPLOYED  Tobacco Use   Smoking status: Every Day    Current packs/day: 0.50    Average packs/day: 0.5  packs/day for 37.0 years (18.5 ttl pk-yrs)    Types: Cigarettes   Smokeless tobacco: Never   Tobacco comments:    1/2 a pack a day  Vaping Use   Vaping status: Never Used  Substance and Sexual Activity   Alcohol use: Yes    Alcohol/week: 2.0 standard drinks of alcohol    Types: 2 Standard drinks or equivalent per week    Comment: Occasional   Drug use: No   Sexual activity: Not on file  Other Topics Concern   Not on file  Social History Narrative   Not on file   Social Determinants of Health   Financial Resource Strain: Low Risk  (09/27/2021)   Overall Financial Resource Strain (CARDIA)    Difficulty of Paying Living Expenses: Not hard at all  Food Insecurity: Low Risk  (09/09/2022)   Received from [facility]   Hunger Vital Sign    Worried About Running Out of Food in the Last Year: Never true    Ran Out of Food in the Last Year: Never true  Transportation Needs: Not on file (09/09/2022)  Physical Activity: Sufficiently Active (09/27/2021)   Exercise Vital Sign    Days of Exercise per Week: 5 days    Minutes of  Exercise per Session: 30 min  Stress: No Stress Concern Present (09/27/2021)   [employer] of Occupational Health - Occupational Stress Questionnaire    Feeling of Stress : Not at all  Social Connections: Moderately Isolated (09/27/2021)   Social Connection and Isolation Panel [NHANES]    Frequency of Communication with Friends and Family: Twice a week    Frequency of Social Gatherings with Friends and Family: Once a week    Attends Religious Services: Never    Database administrator or Organizations: No    Attends Banker Meetings: Never    Marital Status: Married  Catering manager Violence: Not At Risk (09/27/2021)   Humiliation, Afraid, Rape, and Kick questionnaire    Fear of Current or Ex-Partner: No    Emotionally Abused: No    Physically Abused: No    Sexually Abused: No    Outpatient Medications Prior to Visit  Medication Sig Dispense  Refill   allopurinol (ZYLOPRIM) 300 MG tablet take 1 tablet once daily. 30 tablet 0   amLODipine-olmesartan (AZOR) 10-40 MG tablet take 1 tablet once daily. 30 tablet 0   busPIRone (BUSPAR) 5 MG tablet Take by mouth.     diclofenac Sodium (VOLTAREN) 1 % GEL Apply 2 g topically 4 (four) times daily. 2 g 0   gabapentin (NEURONTIN) 400 MG capsule Take by mouth.     GOODSENSE ASPIRIN LOW DOSE 81 MG tablet take 1 tablet once daily. 30 tablet 0   hydrochlorothiazide (HYDRODIURIL) 12.5 MG tablet take (1/2) tablet by mouth daily. 15 tablet 0   oxyCODONE-acetaminophen (PERCOCET) 10-325 MG tablet Take 1 tablet by mouth 4 (four) times daily as needed.     rosuvastatin (CRESTOR) 10 MG tablet take 1 tablet by mouth at bedtime. 30 tablet 0   tadalafil (CIALIS) 20 MG tablet TAKE 1/2 TO 1 TABLET EVERY OTHER DAY AS NEEDED FOR ERECTILE DYSFUNCTION 15 tablet 11   testosterone cypionate (DEPOTESTOSTERONE CYPIONATE) 200 MG/ML injection Inject 0.5 mLs (100 mg total) into the muscle every 7 (seven) days. 4 mL 2   XTAMPZA ER 13.5 MG C12A Take 1 capsule by mouth every 12 (twelve) hours.     No facility-administered medications prior to visit.    Allergies  Allergen Reactions   Morphine And Codeine Other (See Comments)    abd pain and inability to urinate    ROS Review of Systems  Constitutional:  Negative for fatigue and fever.  HENT:  Positive for dental problem.   Eyes:  Negative for visual disturbance.  Respiratory:  Negative for chest tightness and shortness of breath.   Cardiovascular:  Negative for chest pain and palpitations.  Neurological:  Negative for dizziness and headaches.      Objective:    Physical Exam HENT:     Head: Normocephalic.     Right Ear: External ear normal.     Left Ear: External ear normal.     Nose: No congestion or rhinorrhea.     Mouth/Throat:     Mouth: Mucous membranes are moist.     Dentition: Dental tenderness and dental abscesses present.  Cardiovascular:      Rate and Rhythm: Regular rhythm.     Heart sounds: No murmur heard. Pulmonary:     Effort: No respiratory distress.     Breath sounds: Normal breath sounds.  Neurological:     Mental Status: He is alert.     BP 138/82 (BP Location: Left Arm)   Pulse 83   Ht  6' (1.829 m)   Wt 220 lb 0.6 oz (99.8 kg)   SpO2 96%   BMI 29.84 kg/m  Wt Readings from Last 3 Encounters:  11/02/22 220 lb 0.6 oz (99.8 kg)  09/21/22 213 lb 6.4 oz (96.8 kg)  08/02/22 213 lb 1.9 oz (96.7 kg)    Lab Results  Component Value Date   TSH 2.150 11/02/2022   Lab Results  Component Value Date   WBC 10.1 11/02/2022   HGB 15.3 11/02/2022   HCT 44.9 11/02/2022   MCV 91 11/02/2022   PLT 251 11/02/2022   Lab Results  Component Value Date   NA 137 11/02/2022   K 5.0 11/02/2022   CO2 24 11/02/2022   GLUCOSE 98 11/02/2022   BUN 13 11/02/2022   CREATININE 1.23 11/02/2022   BILITOT 0.3 11/02/2022   ALKPHOS 80 11/02/2022   AST 21 11/02/2022   ALT 28 11/02/2022   PROT 7.1 11/02/2022   ALBUMIN 4.6 11/02/2022   CALCIUM 10.1 11/02/2022   ANIONGAP 7 10/20/2017   EGFR 65 11/02/2022   Lab Results  Component Value Date   CHOL 178 11/02/2022   Lab Results  Component Value Date   HDL 39 (L) 11/02/2022   Lab Results  Component Value Date   LDLCALC 93 11/02/2022   Lab Results  Component Value Date   TRIG 273 (H) 11/02/2022   Lab Results  Component Value Date   CHOLHDL 4.6 11/02/2022   Lab Results  Component Value Date   HGBA1C 6.0 (H) 11/02/2022      Assessment & Plan:  Tooth abscess Assessment & Plan: Start taking Augmentin twice daily for 7 days. I recommend taking Percocet as needed for pain relief. Apply ice to the affected area for 15 to 20 minutes to help reduce swelling and pain.  Orders: -     Amoxicillin-Pot Clavulanate; Take 1 tablet by mouth 2 (two) times daily for 7 days.  Dispense: 14 tablet; Refill: 0  Essential hypertension Assessment & Plan: Controlled Encouraged to  continue taking amlodipine-olmesartan 10-40 mg daily and hydrochlorothiazide 12.5 mg (1/2 tablet for a total of 6.25 mg) daily Low-sodium diet with increased physical activity encouraged BP Readings from Last 3 Encounters:  11/02/22 138/82  10/06/22 (!) 155/68  09/21/22 (!) 155/78      Other hyperlipidemia Assessment & Plan: Continue taking rosuvastatin 10 mg daily Encouraged a heart healthy diet with increased physical activity Lab Results  Component Value Date   CHOL 178 11/02/2022   HDL 39 (L) 11/02/2022   LDLCALC 93 11/02/2022   TRIG 273 (H) 11/02/2022   CHOLHDL 4.6 11/02/2022      Orders: -     Lipid panel -     CMP14+EGFR -     CBC with Differential/Platelet  IFG (impaired fasting glucose) -     Hemoglobin A1c  Vitamin D deficiency -     VITAMIN D 25 Hydroxy (Vit-D Deficiency, Fractures)  Other specified hypothyroidism -     TSH + free T4  Note: This chart has been completed using Engineer, civil (consulting) software, and while attempts have been made to ensure accuracy, certain words and phrases may not be transcribed as intended.    Follow-up: Return in about 4 months (around 03/04/2023).   [name], FNP

## 2022-11-02 NOTE — Assessment & Plan Note (Signed)
Controlled Encouraged to continue taking amlodipine-olmesartan 10-40 mg daily and hydrochlorothiazide 12.5 mg (1/2 tablet for a total of 6.25 mg) daily Low-sodium diet with increased physical activity encouraged BP Readings from Last 3 Encounters:  11/02/22 138/82  10/06/22 (!) 155/68  09/21/22 (!) 155/78

## 2022-11-02 NOTE — Assessment & Plan Note (Signed)
Start taking Augmentin twice daily for 7 days. I recommend taking Percocet as needed for pain relief. Apply ice to the affected area for 15 to 20 minutes to help reduce swelling and pain.

## 2022-11-03 LAB — CBC WITH DIFFERENTIAL/PLATELET
Basophils Absolute: 0.1 10*3/uL (ref 0.0–0.2)
Basos: 1 %
EOS (ABSOLUTE): 0.2 10*3/uL (ref 0.0–0.4)
Eos: 2 %
Hematocrit: 44.9 % (ref 37.5–51.0)
Hemoglobin: 15.3 g/dL (ref 13.0–17.7)
Immature Grans (Abs): 0 10*3/uL (ref 0.0–0.1)
Immature Granulocytes: 0 %
Lymphocytes Absolute: 4.2 10*3/uL — ABNORMAL HIGH (ref 0.7–3.1)
Lymphs: 42 %
MCH: 30.9 pg (ref 26.6–33.0)
MCHC: 34.1 g/dL (ref 31.5–35.7)
MCV: 91 fL (ref 79–97)
Monocytes Absolute: 0.8 10*3/uL (ref 0.1–0.9)
Monocytes: 8 %
Neutrophils Absolute: 4.8 10*3/uL (ref 1.4–7.0)
Neutrophils: 47 %
Platelets: 251 10*3/uL (ref 150–450)
RBC: 4.95 x10E6/uL (ref 4.14–5.80)
RDW: 13.5 % (ref 11.6–15.4)
WBC: 10.1 10*3/uL (ref 3.4–10.8)

## 2022-11-03 LAB — CMP14+EGFR
ALT: 28 IU/L (ref 0–44)
AST: 21 IU/L (ref 0–40)
Albumin: 4.6 g/dL (ref 3.9–4.9)
Alkaline Phosphatase: 80 IU/L (ref 44–121)
BUN/Creatinine Ratio: 11 (ref 10–24)
BUN: 13 mg/dL (ref 8–27)
Bilirubin Total: 0.3 mg/dL (ref 0.0–1.2)
CO2: 24 mmol/L (ref 20–29)
Calcium: 10.1 mg/dL (ref 8.6–10.2)
Chloride: 99 mmol/L (ref 96–106)
Creatinine, Ser: 1.23 mg/dL (ref 0.76–1.27)
Globulin, Total: 2.5 g/dL (ref 1.5–4.5)
Glucose: 98 mg/dL (ref 70–99)
Potassium: 5 mmol/L (ref 3.5–5.2)
Sodium: 137 mmol/L (ref 134–144)
Total Protein: 7.1 g/dL (ref 6.0–8.5)
eGFR: 65 mL/min/{1.73_m2} (ref >59)

## 2022-11-03 LAB — HEMOGLOBIN A1C
Est. average glucose Bld gHb Est-mCnc: 126 mg/dL
Hgb A1c MFr Bld: 6 % — ABNORMAL HIGH (ref 4.8–5.6)

## 2022-11-03 LAB — LIPID PANEL
Chol/HDL Ratio: 4.6 ratio (ref 0.0–5.0)
Cholesterol, Total: 178 mg/dL (ref 100–199)
HDL: 39 mg/dL — ABNORMAL LOW (ref >39)
LDL Chol Calc (NIH): 93 mg/dL (ref 0–99)
Triglycerides: 273 mg/dL — ABNORMAL HIGH (ref 0–149)
VLDL Cholesterol Cal: 46 mg/dL — ABNORMAL HIGH (ref 5–40)

## 2022-11-03 LAB — VITAMIN D 25 HYDROXY (VIT D DEFICIENCY, FRACTURES): Vit D, 25-Hydroxy: 37.3 ng/mL (ref 30.0–100.0)

## 2022-11-03 LAB — TSH+FREE T4
Free T4: 1.19 ng/dL (ref 0.82–1.77)
TSH: 2.15 u[IU]/mL (ref 0.450–4.500)

## 2022-11-04 NOTE — Assessment & Plan Note (Signed)
Continue taking rosuvastatin 10 mg daily Encouraged a heart healthy diet with increased physical activity Lab Results  Component Value Date   CHOL 178 11/02/2022   HDL 39 (L) 11/02/2022   LDLCALC 93 11/02/2022   TRIG 273 (H) 11/02/2022   CHOLHDL 4.6 11/02/2022

## 2022-11-05 NOTE — Progress Notes (Signed)
Please inform the patient that his hemoglobin A1c has decreased from 6.1 to 6.0, which is a great improvement. I encourage him to continue with his current treatment regimen. I also recommend reducing the intake of greasy, fatty, and starchy foods, along with increasing physical activity. Additionally, he can start taking over-the-counter fish oil 2000 mg twice daily to help improve triglyceride levels. All other lab results are stable.

## 2022-11-21 ENCOUNTER — Other Ambulatory Visit: Payer: Self-pay | Admitting: Family Medicine

## 2022-11-21 DIAGNOSIS — M10071 Idiopathic gout, right ankle and foot: Secondary | ICD-10-CM

## 2022-11-21 DIAGNOSIS — I10 Essential (primary) hypertension: Secondary | ICD-10-CM

## 2022-12-09 ENCOUNTER — Encounter: Payer: Self-pay | Admitting: Family Medicine

## 2022-12-09 ENCOUNTER — Telehealth: Payer: Self-pay

## 2022-12-09 ENCOUNTER — Telehealth: Payer: Medicare HMO | Admitting: Family Medicine

## 2022-12-09 VITALS — Ht 73.0 in | Wt 215.0 lb

## 2022-12-09 DIAGNOSIS — J069 Acute upper respiratory infection, unspecified: Secondary | ICD-10-CM | POA: Insufficient documentation

## 2022-12-09 MED ORDER — PREDNISONE 20 MG PO TABS
20.0000 mg | ORAL_TABLET | Freq: Two times a day (BID) | ORAL | 0 refills | Status: AC
Start: 2022-12-09 — End: 2022-12-14

## 2022-12-09 MED ORDER — AZITHROMYCIN 250 MG PO TABS
ORAL_TABLET | ORAL | 0 refills | Status: DC
Start: 2022-12-09 — End: 2023-03-30

## 2022-12-09 MED ORDER — BENZONATATE 200 MG PO CAPS
200.0000 mg | ORAL_CAPSULE | Freq: Two times a day (BID) | ORAL | 1 refills | Status: DC | PRN
Start: 2022-12-09 — End: 2023-03-30

## 2022-12-09 NOTE — Assessment & Plan Note (Signed)
Azithromycin 250 mg twice daily x 5 days,  Prednisone 20 mg twice day x 5 days , Benzonatate 200 mg PRN,  Advise patient to rest to support your body's recovery. Stay hydrated by drinking water, tea, or broth. Using a humidifier can help soothe throat irritation and ease nasal congestion. For fever or pain, acetaminophen (Tylenol) is recommended. To relieve other symptoms, try saline nasal sprays, throat lozenges, or gargling with saltwater. Focus on eating light, healthy meals like fruits and vegetables to keep your strength up. Practice good hygiene by washing your hands frequently and covering your mouth when coughing or sneezing.Follow-up for worsening or persistent symptoms. Patient verbalizes understanding regarding plan of care and all questions answered

## 2022-12-09 NOTE — Progress Notes (Signed)
Virtual Visit via Video Note  I connected with Stephen Fuller on 12/09/22 at 11:00 AM EDT by a video enabled telemedicine application and verified that I am speaking with the correct person using two identifiers.  Patient Location: Home Provider Location: Office/Clinic  I discussed the limitations, risks, security, and privacy concerns of performing an evaluation and management service by video and the availability of in person appointments. I also discussed with the patient that there may be a patient responsible charge related to this service. The patient expressed understanding and agreed to proceed.  Subjective: PCP: [name], FNP  Chief Complaint  Patient presents with   Nasal Congestion    Chest congestion, with mucus. Stuffy nose. Otc not helping.   Stephen Fuller 67 year old male, presents via telehealth, reports muscle ache productive cough symptoms of chest congestion, shortness of breath, facial pain, runny nose, fatigue, malaise, and Sime sputum production. These symptoms began several days ago and have progressively worsened. He denies experiencing chest pain. Current treatment with over-the-counter analgesics and antipyretics has been ineffective.Pulmonary history is unremarkable, with no prior history of pneumonia or bronchitis. At home Covid test negative.           ROS: Per HPI  Current Outpatient Medications:    allopurinol (ZYLOPRIM) 300 MG tablet, take 1 tablet once daily., Disp: 30 tablet, Rfl: 0   amLODipine-olmesartan (AZOR) 10-40 MG tablet, take 1 tablet once daily., Disp: 30 tablet, Rfl: 0   azithromycin (ZITHROMAX) 250 MG tablet, Take 2 tablets on day 1, then 1 tablet daily on days 2 through 5, Disp: 6 tablet, Rfl: 0   benzonatate (TESSALON) 200 MG capsule, Take 1 capsule (200 mg total) by mouth 2 (two) times daily as needed for cough., Disp: 20 capsule, Rfl: 1   busPIRone (BUSPAR) 5 MG tablet, Take by mouth., Disp: , Rfl:    diclofenac  Sodium (VOLTAREN) 1 % GEL, Apply 2 g topically 4 (four) times daily., Disp: 2 g, Rfl: 0   gabapentin (NEURONTIN) 400 MG capsule, Take by mouth., Disp: , Rfl:    GOODSENSE ASPIRIN LOW DOSE 81 MG tablet, take 1 tablet once daily., Disp: 30 tablet, Rfl: 0   hydrochlorothiazide (HYDRODIURIL) 12.5 MG tablet, take (1/2) tablet by mouth daily., Disp: 15 tablet, Rfl: 0   oxyCODONE-acetaminophen (PERCOCET) 10-325 MG tablet, Take 1 tablet by mouth 4 (four) times daily as needed., Disp: , Rfl:    predniSONE (DELTASONE) 20 MG tablet, Take 1 tablet (20 mg total) by mouth 2 (two) times daily with a meal for 5 days., Disp: 10 tablet, Rfl: 0   rosuvastatin (CRESTOR) 10 MG tablet, take 1 tablet by mouth at bedtime., Disp: 30 tablet, Rfl: 0   tadalafil (CIALIS) 20 MG tablet, TAKE 1/2 TO 1 TABLET EVERY OTHER DAY AS NEEDED FOR ERECTILE DYSFUNCTION, Disp: 15 tablet, Rfl: 11   testosterone cypionate (DEPOTESTOSTERONE CYPIONATE) 200 MG/ML injection, Inject 0.5 mLs (100 mg total) into the muscle every 7 (seven) days., Disp: 4 mL, Rfl: 2   XTAMPZA ER 13.5 MG C12A, Take 1 capsule by mouth every 12 (twelve) hours., Disp: , Rfl:   Observations/Objective: Today's Vitals   12/09/22 1048  Weight: 215 lb (97.5 kg)  Height: 6\' 1"  (1.854 m)  PainSc: 2   PainLoc: Back   Physical Exam Patient is alert and no acute distress noted.   Assessment and Plan: Upper respiratory tract infection, unspecified type Assessment & Plan: Azithromycin 250 mg twice daily x 5 days,  Prednisone 20 mg twice day x 5 days , Benzonatate 200 mg PRN,  Advise patient to rest to support your body's recovery. Stay hydrated by drinking water, tea, or broth. Using a humidifier can help soothe throat irritation and ease nasal congestion. For fever or pain, acetaminophen (Tylenol) is recommended. To relieve other symptoms, try saline nasal sprays, throat lozenges, or gargling with saltwater. Focus on eating light, healthy meals like fruits and vegetables to  keep your strength up. Practice good hygiene by washing your hands frequently and covering your mouth when coughing or sneezing.Follow-up for worsening or persistent symptoms. Patient verbalizes understanding regarding plan of care and all questions answered    Orders: -     predniSONE; Take 1 tablet (20 mg total) by mouth 2 (two) times daily with a meal for 5 days.  Dispense: 10 tablet; Refill: 0 -     Benzonatate; Take 1 capsule (200 mg total) by mouth 2 (two) times daily as needed for cough.  Dispense: 20 capsule; Refill: 1 -     Azithromycin; Take 2 tablets on day 1, then 1 tablet daily on days 2 through 5  Dispense: 6 tablet; Refill: 0     Follow Up Instructions: No follow-ups on file.   I discussed the assessment and treatment plan with the patient. The patient was provided an opportunity to ask questions, and all were answered. The patient agreed with the plan and demonstrated an understanding of the instructions.   The patient was advised to call back or seek an in-person evaluation if the symptoms worsen or if the condition fails to improve as anticipated.  The above assessment and management plan was discussed with the patient. The patient verbalized understanding of and has agreed to the management plan.   Cruzita Lederer Newman Nip, FNP

## 2022-12-09 NOTE — Telephone Encounter (Signed)
Patient left message stating [employer] needs PA for testosterone. He left number [phone]

## 2022-12-13 NOTE — Telephone Encounter (Signed)
PA faxed to [employer]

## 2022-12-15 ENCOUNTER — Other Ambulatory Visit: Payer: Self-pay | Admitting: Family Medicine

## 2022-12-15 DIAGNOSIS — I10 Essential (primary) hypertension: Secondary | ICD-10-CM

## 2022-12-15 DIAGNOSIS — M10071 Idiopathic gout, right ankle and foot: Secondary | ICD-10-CM

## 2022-12-20 DIAGNOSIS — Z79891 Long term (current) use of opiate analgesic: Secondary | ICD-10-CM | POA: Diagnosis not present

## 2022-12-20 DIAGNOSIS — M5451 Vertebrogenic low back pain: Secondary | ICD-10-CM | POA: Diagnosis not present

## 2022-12-20 DIAGNOSIS — F411 Generalized anxiety disorder: Secondary | ICD-10-CM | POA: Diagnosis not present

## 2022-12-20 DIAGNOSIS — G894 Chronic pain syndrome: Secondary | ICD-10-CM | POA: Diagnosis not present

## 2022-12-20 DIAGNOSIS — M542 Cervicalgia: Secondary | ICD-10-CM | POA: Diagnosis not present

## 2022-12-20 DIAGNOSIS — M5417 Radiculopathy, lumbosacral region: Secondary | ICD-10-CM | POA: Diagnosis not present

## 2023-01-11 ENCOUNTER — Ambulatory Visit: Payer: Medicare HMO

## 2023-01-11 VITALS — Ht 73.0 in | Wt 215.0 lb

## 2023-01-11 DIAGNOSIS — Z0000 Encounter for general adult medical examination without abnormal findings: Secondary | ICD-10-CM | POA: Diagnosis not present

## 2023-01-11 NOTE — Patient Instructions (Signed)
Mr. Biese , Thank you for taking time to come for your Medicare Wellness Visit. I appreciate your ongoing commitment to your health goals. Please review the following plan we discussed and let me know if I can assist you in the future.   Referrals/Orders/Follow-Ups/Clinician Recommendations: Aim for 30 minutes of exercise or brisk walking, 6-8 glasses of water, and 5 servings of fruits and vegetables each day.  This is a list of the screening recommended for you and due dates:  Health Maintenance  Topic Date Due   COVID-19 Vaccine (1) Never done   Flu Shot  05/15/2023*   Medicare Annual Wellness Visit  01/11/2024   Colon Cancer Screening  06/23/2026   DTaP/Tdap/Td vaccine (2 - Td or Tdap) 07/27/2031   Pneumonia Vaccine  Completed   Hepatitis C Screening  Completed   Zoster (Shingles) Vaccine  Completed   HPV Vaccine  Aged Out  *Topic was postponed. The date shown is not the original due date.    Advanced directives: (ACP Link)Information on Advanced Care Planning can be found at [facility] Advance Health Care Directives Advance Health Care Directives ([email or web address]   Next Medicare Annual Wellness Visit scheduled for next year: Yes

## 2023-01-11 NOTE — Progress Notes (Signed)
Subjective:   Stephen Fuller is a 67 y.o. male who presents for an Initial Medicare Annual Wellness Visit.  Visit Complete: Virtual I connected with  Stephen Fuller on 01/11/23 by a audio enabled telemedicine application and verified that I am speaking with the correct person using two identifiers.  Patient Location: Home  Provider Location: Home Office  I discussed the limitations of evaluation and management by telemedicine. The patient expressed understanding and agreed to proceed.  Vital Signs: Because this visit was a virtual/telehealth visit, some criteria may be missing or patient reported. Any vitals not documented were not able to be obtained and vitals that have been documented are patient reported.  Patient Medicare AWV questionnaire was completed by the patient on 01/08/23; I have confirmed that all information answered by patient is correct and no changes since this date.  Cardiac Risk Factors include: advanced age (>42men, >59 women);hypertension;male gender;smoking/ tobacco exposure;dyslipidemia     Objective:    Today's Vitals   01/11/23 1331  Weight: 215 lb (97.5 kg)  Height: 6\' 1"  (1.854 m)   Body mass index is 28.37 kg/m.     01/11/2023    1:53 PM 09/27/2021    8:49 AM 10/27/2017    1:44 PM 08/21/2017    2:24 PM 06/14/2017   10:59 AM 05/09/2017   10:17 AM 03/09/2017    7:29 AM  Advanced Directives  Does Patient Have a Medical Advance Directive? No No No No No No No  Would patient like information on creating a medical advance directive? Yes (MAU/Ambulatory/Procedural Areas - Information given) No - Patient declined No - Patient declined No - Patient declined No - Patient declined No - Patient declined No - Patient declined    Current Medications (verified) Outpatient Encounter Medications as of 01/11/2023  Medication Sig   allopurinol (ZYLOPRIM) 300 MG tablet take 1 tablet once daily.   amLODipine-olmesartan (AZOR) 10-40 MG tablet take 1 tablet once  daily.   azithromycin (ZITHROMAX) 250 MG tablet Take 2 tablets on day 1, then 1 tablet daily on days 2 through 5   benzonatate (TESSALON) 200 MG capsule Take 1 capsule (200 mg total) by mouth 2 (two) times daily as needed for cough.   buprenorphine (BUTRANS) 7.5 MCG/HR SMARTSIG:Topical   busPIRone (BUSPAR) 5 MG tablet Take by mouth.   diclofenac Sodium (VOLTAREN) 1 % GEL Apply 2 g topically 4 (four) times daily.   gabapentin (NEURONTIN) 400 MG capsule Take by mouth.   GOODSENSE ASPIRIN LOW DOSE 81 MG tablet take 1 tablet once daily.   hydrochlorothiazide (HYDRODIURIL) 12.5 MG tablet take (1/2) tablet by mouth daily.   oxyCODONE-acetaminophen (PERCOCET) 10-325 MG tablet Take 1 tablet by mouth 4 (four) times daily as needed.   rosuvastatin (CRESTOR) 10 MG tablet take 1 tablet by mouth at bedtime.   tadalafil (CIALIS) 20 MG tablet TAKE 1/2 TO 1 TABLET EVERY OTHER DAY AS NEEDED FOR ERECTILE DYSFUNCTION   testosterone cypionate (DEPOTESTOSTERONE CYPIONATE) 200 MG/ML injection Inject 0.5 mLs (100 mg total) into the muscle every 7 (seven) days.   XTAMPZA ER 13.5 MG C12A Take 1 capsule by mouth every 12 (twelve) hours.   No facility-administered encounter medications on file as of 01/11/2023.    Allergies (verified) Morphine and codeine   History: Past Medical History:  Diagnosis Date   Anxiety 2022   Arthritis    Chest pain    Hepatitis C virus infection cured after antiviral drug therapy    Hypertension  Palpitations    Past Surgical History:  Procedure Laterality Date   CHOLECYSTECTOMY  2005   COLONOSCOPY  2019   LIVER BIOPSY  2019   Family History  Problem Relation Age of Onset   Diabetes Mother    Stroke Mother    Hypertension Mother    Heart disease Other        No family history   Diabetes Brother    Cirrhosis Brother    Colon cancer Neg Hx    Social History   Socioeconomic History   Marital status: Married    Spouse name: Not on file   Number of children: 2    Years of education: Not on file   Highest education level: Not on file  Occupational History   Occupation: UNEMPLOYED  Tobacco Use   Smoking status: Every Day    Current packs/day: 0.50    Average packs/day: 0.5 packs/day for 37.0 years (18.5 ttl pk-yrs)    Types: Cigarettes   Smokeless tobacco: Never   Tobacco comments:    1/2 a pack a day  Vaping Use   Vaping status: Never Used  Substance and Sexual Activity   Alcohol use: Not Currently    Alcohol/week: 2.0 standard drinks of alcohol    Types: 2 Standard drinks or equivalent per week    Comment: Occasional   Drug use: No   Sexual activity: Not on file  Other Topics Concern   Not on file  Social History Narrative   Not on file   Social Determinants of Health   Financial Resource Strain: Medium Risk (01/08/2023)   Overall Financial Resource Strain (CARDIA)    Difficulty of Paying Living Expenses: Somewhat hard  Food Insecurity: Food Insecurity Present (01/08/2023)   Hunger Vital Sign    Worried About Running Out of Food in the Last Year: Never true    Ran Out of Food in the Last Year: Sometimes true  Transportation Needs: No Transportation Needs (01/08/2023)   PRAPARE - Administrator, Civil Service (Medical): No    Lack of Transportation (Non-Medical): No  Physical Activity: Sufficiently Active (01/08/2023)   Exercise Vital Sign    Days of Exercise per Week: 5 days    Minutes of Exercise per Session: 110 min  Stress: Stress Concern Present (01/08/2023)   [employer] of Occupational Health - Occupational Stress Questionnaire    Feeling of Stress : To some extent  Social Connections: Moderately Isolated (01/08/2023)   Social Connection and Isolation Panel [NHANES]    Frequency of Communication with Friends and Family: More than three times a week    Frequency of Social Gatherings with Friends and Family: Once a week    Attends Religious Services: Never    Database administrator or Organizations: No     Attends Engineer, structural: Never    Marital Status: Married    Tobacco Counseling Ready to quit: No Counseling given: Not Answered Tobacco comments: 1/2 a pack a day   Clinical Intake:  Pre-visit preparation completed: Yes  Pain : No/denies pain     Diabetes: No  How often do you need to have someone help you when you read instructions, pamphlets, or other written materials from your doctor or pharmacy?: 3 - Sometimes  Interpreter Needed?: No  Information entered by :: Kandis Fantasia LPN   Activities of Daily Living    01/08/2023   12:17 PM  In your present state of health, do you have any difficulty  performing the following activities:  Hearing? 1  Vision? 0  Difficulty concentrating or making decisions? 0  Walking or climbing stairs? 1  Dressing or bathing? 0  Doing errands, shopping? 0  Preparing Food and eating ? Y  Using the Toilet? N  In the past six months, have you accidently leaked urine? N  Do you have problems with loss of bowel control? N  Managing your Medications? N  Managing your Finances? N  Housekeeping or managing your Housekeeping? N    Patient Care Team: [name], FNP as PCP - General (Family Medicine) Laqueta Linden, MD (Inactive) as Attending Physician (Cardiology) [facility] [place] Bjorn Pippin, MD as Attending Physician (Urology) Roanna Epley, NP as Nurse Practitioner (Gastroenterology)  Indicate any recent Medical Services you may have received from other than [facility] providers in the past year (date may be approximate).     Assessment:   This is a routine wellness examination for Cully.  Hearing/Vision screen Hearing Screening - Comments:: Followed by audiology; bilateral hearing aids  Vision Screening - Comments::  up to date with routine eye exams with [facility]    Goals Addressed             This Visit's Progress    Remain active and independent         Depression Screen    01/11/2023    1:52 PM 08/02/2022    9:27 AM 08/02/2022    9:07 AM 04/20/2022    8:12 AM 01/24/2022    8:55 AM 12/27/2021    8:56 AM 11/30/2021    1:55 PM  PHQ 2/9 Scores  PHQ - 2 Score 0 0 0 0 0 0 0  PHQ- 9 Score 2 3 0 3 0 0     Fall Risk    01/11/2023    1:57 PM 01/08/2023   12:17 PM 08/02/2022    9:26 AM 08/02/2022    9:07 AM 04/20/2022    8:12 AM  Fall Risk   Falls in the past year? 0 0 0 0 0  Number falls in past yr: 0  0 0 0  Injury with Fall? 0  0 0 0  Risk for fall due to : Impaired mobility  No Fall Risks No Fall Risks No Fall Risks  Follow up Falls prevention discussed;Education provided;Falls evaluation completed  Falls evaluation completed Falls evaluation completed Falls evaluation completed    MEDICARE RISK AT HOME: Medicare Risk at Home Any stairs in or around the home?: Yes If so, are there any without handrails?: No Home free of loose throw rugs in walkways, pet beds, electrical cords, etc?: Yes Adequate lighting in your home to reduce risk of falls?: Yes Life alert?: No Use of a cane, walker or w/c?: No Grab bars in the bathroom?: No Shower chair or bench in shower?: No Elevated toilet seat or a handicapped toilet?: No  TIMED UP AND GO:  Was the test performed? No    Cognitive Function:        01/11/2023    1:58 PM  6CIT Screen  What Year? 0 points  What month? 0 points  What time? 0 points  Count back from 20 0 points  Months in reverse 2 points  Repeat phrase 2 points  Total Score 4 points    Immunizations Immunization History  Administered Date(s) Administered   Fluzone Influenza virus vaccine,trivalent (IIV3), split virus 05/01/2009, 02/20/2012   Influenza,inj,Quad PF,6+ Mos 12/15/2017   Influenza,inj,quad,  With Preservative 11/19/2016, 12/15/2017   PNEUMOCOCCAL CONJUGATE-20 10/25/2021   Tdap 07/26/2021   Zoster Recombinant(Shingrix) 07/26/2021, 10/25/2021    TDAP status: Up to date  Flu Vaccine status:  Due, Education has been provided regarding the importance of this vaccine. Advised may receive this vaccine at local pharmacy or Health Dept. Aware to provide a copy of the vaccination record if obtained from local pharmacy or Health Dept. Verbalized acceptance and understanding.  Pneumococcal vaccine status: Up to date  Covid-19 vaccine status: Information provided on how to obtain vaccines.   Qualifies for Shingles Vaccine? Yes   Zostavax completed No   Shingrix Completed?: Yes  Screening Tests Health Maintenance  Topic Date Due   COVID-19 Vaccine (1) Never done   INFLUENZA VACCINE  05/15/2023 (Originally 09/15/2022)   Medicare Annual Wellness (AWV)  01/11/2024   Colonoscopy  06/23/2026   DTaP/Tdap/Td (2 - Td or Tdap) 07/27/2031   Pneumonia Vaccine 79+ Years old  Completed   Hepatitis C Screening  Completed   Zoster Vaccines- Shingrix  Completed   HPV VACCINES  Aged Out    Health Maintenance  Health Maintenance Due  Topic Date Due   COVID-19 Vaccine (1) Never done    Colorectal cancer screening: Type of screening: Colonoscopy. Completed 06/22/16. Repeat every 10 years  Lung Cancer Screening: (Low Dose CT Chest recommended if Age 75-80 years, 20 pack-year currently smoking OR have quit w/in 15years.) does not qualify.   Lung Cancer Screening Referral: n/a  Additional Screening:  Hepatitis C Screening: does qualify; Completed 03/09/17  Vision Screening: Recommended annual ophthalmology exams for early detection of glaucoma and other disorders of the eye. Is the patient up to date with their annual eye exam?  Yes  Who is the provider or what is the name of the office in which the patient attends annual eye exams? [facility] If pt is not established with a provider, would they like to be referred to a provider to establish care? No .   Dental Screening: Recommended annual dental exams for proper oral hygiene  Community Resource Referral / Chronic Care Management: CRR  required this visit?  No   CCM required this visit?  No    Plan:     I have personally reviewed and noted the following in the patient's chart:   Medical and social history Use of alcohol, tobacco or illicit drugs  Current medications and supplements including opioid prescriptions. Patient is currently taking opioid prescriptions. Information provided to patient regarding non-opioid alternatives. Patient advised to discuss non-opioid treatment plan with their provider. Functional ability and status Nutritional status Physical activity Advanced directives List of other physicians Hospitalizations, surgeries, and ER visits in previous 12 months Vitals Screenings to include cognitive, depression, and falls Referrals and appointments  In addition, I have reviewed and discussed with patient certain preventive protocols, quality metrics, and best practice recommendations. A written personalized care plan for preventive services as well as general preventive health recommendations were provided to patient.     Kandis Fantasia [place], [place]   [id number]/1191   After Visit Summary: (MyChart) Due to this being a telephonic visit, the after visit summary with patients personalized plan was offered to patient via MyChart   Nurse Notes: No concerns at this time

## 2023-01-16 ENCOUNTER — Other Ambulatory Visit: Payer: Self-pay | Admitting: Family Medicine

## 2023-01-16 DIAGNOSIS — I10 Essential (primary) hypertension: Secondary | ICD-10-CM

## 2023-01-16 DIAGNOSIS — M10071 Idiopathic gout, right ankle and foot: Secondary | ICD-10-CM

## 2023-02-14 ENCOUNTER — Other Ambulatory Visit: Payer: Self-pay | Admitting: Family Medicine

## 2023-02-14 DIAGNOSIS — I10 Essential (primary) hypertension: Secondary | ICD-10-CM

## 2023-02-14 DIAGNOSIS — M10071 Idiopathic gout, right ankle and foot: Secondary | ICD-10-CM

## 2023-02-14 DIAGNOSIS — N528 Other male erectile dysfunction: Secondary | ICD-10-CM

## 2023-02-18 ENCOUNTER — Other Ambulatory Visit: Payer: Self-pay | Admitting: Urology

## 2023-03-06 ENCOUNTER — Ambulatory Visit (INDEPENDENT_AMBULATORY_CARE_PROVIDER_SITE_OTHER): Payer: Medicare (Managed Care) | Admitting: Family Medicine

## 2023-03-06 ENCOUNTER — Encounter: Payer: Self-pay | Admitting: Family Medicine

## 2023-03-06 VITALS — BP 150/82 | HR 82 | Ht 72.0 in | Wt 233.0 lb

## 2023-03-06 DIAGNOSIS — Z720 Tobacco use: Secondary | ICD-10-CM | POA: Diagnosis not present

## 2023-03-06 DIAGNOSIS — N528 Other male erectile dysfunction: Secondary | ICD-10-CM

## 2023-03-06 DIAGNOSIS — E7849 Other hyperlipidemia: Secondary | ICD-10-CM

## 2023-03-06 DIAGNOSIS — R7301 Impaired fasting glucose: Secondary | ICD-10-CM

## 2023-03-06 DIAGNOSIS — I10 Essential (primary) hypertension: Secondary | ICD-10-CM

## 2023-03-06 DIAGNOSIS — E559 Vitamin D deficiency, unspecified: Secondary | ICD-10-CM

## 2023-03-06 DIAGNOSIS — E038 Other specified hypothyroidism: Secondary | ICD-10-CM

## 2023-03-06 MED ORDER — TADALAFIL 20 MG PO TABS: ORAL_TABLET | ORAL | 0 refills | Status: DC

## 2023-03-06 MED ORDER — HYDROCHLOROTHIAZIDE 12.5 MG PO TABS
ORAL_TABLET | ORAL | 1 refills | Status: DC
Start: 2023-03-06 — End: 2023-11-09

## 2023-03-06 MED ORDER — AMLODIPINE-OLMESARTAN 10-40 MG PO TABS: 1.0000 | ORAL_TABLET | Freq: Every day | ORAL | 1 refills | Status: DC

## 2023-03-06 NOTE — Patient Instructions (Signed)
I appreciate the opportunity to provide care to you today!    Follow up:  4 months  Fasting Labs: please stop by the lab today to get your blood drawn (CBC, CMP, TSH, Lipid profile, HgA1c, Vit D)   I recommend smoking cessation as a critical step for improving your overall health. Smoking significantly increases your risk for a range of serious health issues, including cancer, chronic obstructive pulmonary disease (COPD), high blood pressure, cataracts, and various digestive problems. Additionally, smoking can lead to oral health issues such as gum disease, mouth sores, tooth loss, and diminished taste and smell. It also irritates the throat and contributes to persistent coughing. Quitting smoking can greatly reduce these risks and improve your quality of life.      Attached with your AVS, you will find valuable resources for self-education. I highly recommend dedicating some time to thoroughly examine them.   Please continue to a heart-healthy diet and increase your physical activities. Try to exercise for at least five days a week.    It was a pleasure to see you and I look forward to continuing to work together on your health and well-being. Please do not hesitate to call the office if you need care or have questions about your care.  In case of emergency, please visit the Emergency Department for urgent care, or contact our clinic at [phone] to schedule an appointment. We're here to help you!   Have a wonderful day and week. With Gratitude, [name] MSN, FNP-BC

## 2023-03-06 NOTE — Assessment & Plan Note (Signed)
Smokes about 1/2 pack/day  Asked about quitting: confirms that she currently smokes cigarettes Advise to quit smoking: Educated about QUITTING to reduce the risk of cancer, cardio and cerebrovascular disease. Assess willingness: Unwilling to quit at this time, but is working on cutting back. Assist with counseling and pharmacotherapy: Counseled for 5 minutes and literature provided. Arrange for follow up: follow up in 3 months and continue to offer help. 

## 2023-03-06 NOTE — Progress Notes (Signed)
Established Patient Office Visit  Subjective:  Patient ID: Stephen Fuller, male    DOB: 29-Mar-1955  Age: 68 y.o. MRN: [id number]  CC:  Chief Complaint  Patient presents with   Follow-up    HTN      HPI Stephen Fuller is a 68 y.o. male with past medical history of high blood pressure, tobacco use, erectile dysfunction presents for f/u of  chronic medical conditions. For the details of today's visit, please refer to the assessment and plan.     Past Medical History:  Diagnosis Date   Anxiety 2022   Arthritis    Chest pain    Hepatitis C virus infection cured after antiviral drug therapy    Hypertension    Palpitations     Past Surgical History:  Procedure Laterality Date   CHOLECYSTECTOMY  2005   COLONOSCOPY  2019   LIVER BIOPSY  2019    Family History  Problem Relation Age of Onset   Diabetes Mother    Stroke Mother    Hypertension Mother    Heart disease Other        No family history   Diabetes Brother    Cirrhosis Brother    Colon cancer Neg Hx     Social History   Socioeconomic History   Marital status: Married    Spouse name: Not on file   Number of children: 2   Years of education: Not on file   Highest education level: Never attended school  Occupational History   Occupation: UNEMPLOYED  Tobacco Use   Smoking status: Every Day    Current packs/day: 0.50    Average packs/day: 0.5 packs/day for 37.0 years (18.5 ttl pk-yrs)    Types: Cigarettes   Smokeless tobacco: Never   Tobacco comments:    1/2 a pack a day  Vaping Use   Vaping status: Never Used  Substance and Sexual Activity   Alcohol use: Not Currently    Alcohol/week: 2.0 standard drinks of alcohol    Types: 2 Standard drinks or equivalent per week    Comment: Occasional   Drug use: No   Sexual activity: Not on file  Other Topics Concern   Not on file  Social History Narrative   Not on file   Social Drivers of Health   Financial Resource Strain: Low Risk  (03/02/2023)    Overall Financial Resource Strain (CARDIA)    Difficulty of Paying Living Expenses: Not hard at all  Recent Concern: Financial Resource Strain - Medium Risk (01/08/2023)   Overall Financial Resource Strain (CARDIA)    Difficulty of Paying Living Expenses: Somewhat hard  Food Insecurity: No Food Insecurity (03/02/2023)   Hunger Vital Sign    Worried About Running Out of Food in the Last Year: Never true    Ran Out of Food in the Last Year: Never true  Recent Concern: Food Insecurity - Food Insecurity Present (01/08/2023)   Hunger Vital Sign    Worried About Running Out of Food in the Last Year: Never true    Ran Out of Food in the Last Year: Sometimes true  Transportation Needs: No Transportation Needs (03/02/2023)   PRAPARE - Administrator, Civil Service (Medical): No    Lack of Transportation (Non-Medical): No  Physical Activity: Sufficiently Active (03/02/2023)   Exercise Vital Sign    Days of Exercise per Week: 4 days    Minutes of Exercise per Session: 60 min  Stress: Stress Concern Present (  03/02/2023)   [employer] - Occupational Stress Questionnaire    Feeling of Stress : To some extent  Social Connections: Socially Isolated (03/02/2023)   Social Connection and Isolation Panel [NHANES]    Frequency of Communication with Friends and Family: Once a week    Frequency of Social Gatherings with Friends and Family: Once a week    Attends Religious Services: 1 to 4 times per year    Active Member of [employer] or Organizations: No    Attends Banker Meetings: Never    Marital Status: Separated  Intimate Partner Violence: Not At Risk (01/11/2023)   Humiliation, Afraid, Rape, and Kick questionnaire    Fear of Current or Ex-Partner: No    Emotionally Abused: No    Physically Abused: No    Sexually Abused: No    Outpatient Medications Prior to Visit  Medication Sig Dispense Refill   allopurinol (ZYLOPRIM) 300 MG tablet take 1 tablet  once daily. 30 tablet 0   azithromycin (ZITHROMAX) 250 MG tablet Take 2 tablets on day 1, then 1 tablet daily on days 2 through 5 6 tablet 0   benzonatate (TESSALON) 200 MG capsule Take 1 capsule (200 mg total) by mouth 2 (two) times daily as needed for cough. 20 capsule 1   buprenorphine (BUTRANS) 7.5 MCG/HR SMARTSIG:Topical     busPIRone (BUSPAR) 5 MG tablet Take by mouth.     diclofenac Sodium (VOLTAREN) 1 % GEL Apply 2 g topically 4 (four) times daily. 2 g 0   gabapentin (NEURONTIN) 400 MG capsule Take by mouth.     GOODSENSE ASPIRIN LOW DOSE 81 MG tablet take 1 tablet once daily. 30 tablet 0   oxyCODONE-acetaminophen (PERCOCET) 10-325 MG tablet Take 1 tablet by mouth 4 (four) times daily as needed.     rosuvastatin (CRESTOR) 10 MG tablet take 1 tablet by mouth at bedtime. 30 tablet 0   testosterone cypionate (DEPOTESTOSTERONE CYPIONATE) 200 MG/ML injection INJECT 0.5 MLS (100 MG TOTAL) INTO THE MUSCLE EVERY 7 (SEVEN) DAYS. 4 mL 2   XTAMPZA ER 13.5 MG C12A Take 1 capsule by mouth every 12 (twelve) hours.     amLODipine-olmesartan (AZOR) 10-40 MG tablet take 1 tablet once daily. 30 tablet 0   hydrochlorothiazide (HYDRODIURIL) 12.5 MG tablet take (1/2) tablet by mouth daily. 15 tablet 0   tadalafil (CIALIS) 20 MG tablet take ONE-HALF (1/2) to 1 tablet every other day as needed for erectile dysfunction 15 tablet 0   No facility-administered medications prior to visit.    Allergies  Allergen Reactions   Morphine And Codeine Other (See Comments)    abd pain and inability to urinate    ROS Review of Systems  Constitutional:  Negative for fatigue and fever.  Eyes:  Negative for visual disturbance.  Respiratory:  Negative for chest tightness and shortness of breath.   Cardiovascular:  Negative for chest pain and palpitations.  Neurological:  Negative for dizziness and headaches.      Objective:    Physical Exam HENT:     Head: Normocephalic.     Right Ear: External ear normal.      Left Ear: External ear normal.     Nose: No congestion or rhinorrhea.     Mouth/Throat:     Mouth: Mucous membranes are moist.  Cardiovascular:     Rate and Rhythm: Regular rhythm.     Heart sounds: No murmur heard. Pulmonary:     Effort: No respiratory distress.  Breath sounds: Normal breath sounds.  Neurological:     Mental Status: He is alert.     BP (!) 150/82   Pulse 82   Ht 6' (1.829 m)   Wt 233 lb 0.6 oz (105.7 kg)   SpO2 93%   BMI 31.61 kg/m  Wt Readings from Last 3 Encounters:  03/06/23 233 lb 0.6 oz (105.7 kg)  01/11/23 215 lb (97.5 kg)  12/09/22 215 lb (97.5 kg)    Lab Results  Component Value Date   TSH 2.150 11/02/2022   Lab Results  Component Value Date   WBC 10.1 11/02/2022   HGB 15.3 11/02/2022   HCT 44.9 11/02/2022   MCV 91 11/02/2022   PLT 251 11/02/2022   Lab Results  Component Value Date   NA 137 11/02/2022   K 5.0 11/02/2022   CO2 24 11/02/2022   GLUCOSE 98 11/02/2022   BUN 13 11/02/2022   CREATININE 1.23 11/02/2022   BILITOT 0.3 11/02/2022   ALKPHOS 80 11/02/2022   AST 21 11/02/2022   ALT 28 11/02/2022   PROT 7.1 11/02/2022   ALBUMIN 4.6 11/02/2022   CALCIUM 10.1 11/02/2022   ANIONGAP 7 10/20/2017   EGFR 65 11/02/2022   Lab Results  Component Value Date   CHOL 178 11/02/2022   Lab Results  Component Value Date   HDL 39 (L) 11/02/2022   Lab Results  Component Value Date   LDLCALC 93 11/02/2022   Lab Results  Component Value Date   TRIG 273 (H) 11/02/2022   Lab Results  Component Value Date   CHOLHDL 4.6 11/02/2022   Lab Results  Component Value Date   HGBA1C 6.0 (H) 11/02/2022      Assessment & Plan:  Essential hypertension Assessment & Plan: The patient's blood pressure remains uncontrolled in the clinic, although he is asymptomatic and reports compliance with his treatment regimen. He takes amlodipine-olmesartan 10/40 mg and hydrochlorothiazide 6.25 mg daily. He states his medications are typically  packaged by the pharmacy and reiterates that he remains compliant. A follow-up nurse visit is scheduled in one week to reassess his blood pressure. A low-sodium diet of less than 2,300 mg daily is recommended, along with moderate-intensity physical activity totaling 150 minutes per week. The patient is encouraged to continue these lifestyle modifications to better manage his blood pressure.    Orders: -     amLODIPine-Olmesartan; Take 1 tablet by mouth daily.  Dispense: 90 tablet; Refill: 1 -     hydroCHLOROthiazide; TAKE (1/2) TABLET BY MOUTH DAILY.  Dispense: 60 tablet; Refill: 1  Tobacco use Assessment & Plan: Smokes about 1/2 pack/day  Asked about quitting: confirms that she currently smokes cigarettes Advise to quit smoking: Educated about QUITTING to reduce the risk of cancer, cardio and cerebrovascular disease. Assess willingness: Unwilling to quit at this time, but is working on cutting back. Assist with counseling and pharmacotherapy: Counseled for 5 minutes and literature provided. Arrange for follow up: follow up in 3 months and continue to offer help.    Other male erectile dysfunction Assessment & Plan: Refill sent   Orders: -     Tadalafil; take ONE-HALF (1/2) to 1 tablet every other day as needed for erectile dysfunction  Dispense: 15 tablet; Refill: 0  IFG (impaired fasting glucose) -     Hemoglobin A1c  Vitamin D deficiency -     VITAMIN D 25 Hydroxy (Vit-D Deficiency, Fractures)  TSH (thyroid-stimulating hormone deficiency) -     TSH + free T4  Other hyperlipidemia -     Lipid panel -     CMP14+EGFR -     CBC with Differential/Platelet  Note: This chart has been completed using Engineer, civil (consulting) software, and while attempts have been made to ensure accuracy, certain words and phrases may not be transcribed as intended.    Follow-up: No follow-ups on file.   [name], FNP

## 2023-03-06 NOTE — Assessment & Plan Note (Signed)
The patient's blood pressure remains uncontrolled in the clinic, although he is asymptomatic and reports compliance with his treatment regimen. He takes amlodipine-olmesartan 10/40 mg and hydrochlorothiazide 6.25 mg daily. He states his medications are typically packaged by the pharmacy and reiterates that he remains compliant. A follow-up nurse visit is scheduled in one week to reassess his blood pressure. A low-sodium diet of less than 2,300 mg daily is recommended, along with moderate-intensity physical activity totaling 150 minutes per week. The patient is encouraged to continue these lifestyle modifications to better manage his blood pressure.

## 2023-03-06 NOTE — Assessment & Plan Note (Signed)
Refill sent.

## 2023-03-13 ENCOUNTER — Ambulatory Visit: Payer: Medicare (Managed Care)

## 2023-03-14 LAB — LIPID PANEL
Chol/HDL Ratio: 6.2 {ratio} — ABNORMAL HIGH (ref 0.0–5.0)
Cholesterol, Total: 234 mg/dL — ABNORMAL HIGH (ref 100–199)
HDL: 38 mg/dL — ABNORMAL LOW (ref >39)
LDL Chol Calc (NIH): 118 mg/dL — ABNORMAL HIGH (ref 0–99)
Triglycerides: 442 mg/dL — ABNORMAL HIGH (ref 0–149)
VLDL Cholesterol Cal: 78 mg/dL — ABNORMAL HIGH (ref 5–40)

## 2023-03-14 LAB — HEMOGLOBIN A1C
Est. average glucose Bld gHb Est-mCnc: 128 mg/dL
Hgb A1c MFr Bld: 6.1 % — ABNORMAL HIGH (ref 4.8–5.6)

## 2023-03-14 LAB — CBC WITH DIFFERENTIAL/PLATELET
Basophils Absolute: 0.1 10*3/uL (ref 0.0–0.2)
Basos: 1 %
EOS (ABSOLUTE): 0.3 10*3/uL (ref 0.0–0.4)
Eos: 3 %
Hematocrit: 46.6 % (ref 37.5–51.0)
Hemoglobin: 16 g/dL (ref 13.0–17.7)
Immature Grans (Abs): 0 10*3/uL (ref 0.0–0.1)
Immature Granulocytes: 0 %
Lymphocytes Absolute: 4.6 10*3/uL — ABNORMAL HIGH (ref 0.7–3.1)
Lymphs: 46 %
MCH: 30.8 pg (ref 26.6–33.0)
MCHC: 34.3 g/dL (ref 31.5–35.7)
MCV: 90 fL (ref 79–97)
Monocytes Absolute: 0.7 10*3/uL (ref 0.1–0.9)
Monocytes: 7 %
Neutrophils Absolute: 4.2 10*3/uL (ref 1.4–7.0)
Neutrophils: 43 %
Platelets: 229 10*3/uL (ref 150–450)
RBC: 5.19 x10E6/uL (ref 4.14–5.80)
RDW: 13.8 % (ref 11.6–15.4)
WBC: 9.9 10*3/uL (ref 3.4–10.8)

## 2023-03-14 LAB — CMP14+EGFR
ALT: 32 [IU]/L (ref 0–44)
AST: 17 [IU]/L (ref 0–40)
Albumin: 4.6 g/dL (ref 3.9–4.9)
Alkaline Phosphatase: 74 [IU]/L (ref 44–121)
BUN/Creatinine Ratio: 12 (ref 10–24)
BUN: 14 mg/dL (ref 8–27)
Bilirubin Total: 0.4 mg/dL (ref 0.0–1.2)
CO2: 24 mmol/L (ref 20–29)
Calcium: 10 mg/dL (ref 8.6–10.2)
Chloride: 98 mmol/L (ref 96–106)
Creatinine, Ser: 1.18 mg/dL (ref 0.76–1.27)
Globulin, Total: 2.2 g/dL (ref 1.5–4.5)
Glucose: 142 mg/dL — ABNORMAL HIGH (ref 70–99)
Potassium: 4.2 mmol/L (ref 3.5–5.2)
Sodium: 137 mmol/L (ref 134–144)
Total Protein: 6.8 g/dL (ref 6.0–8.5)
eGFR: 68 mL/min/{1.73_m2} (ref >59)

## 2023-03-14 LAB — TSH+FREE T4
Free T4: 1.23 ng/dL (ref 0.82–1.77)
TSH: 1.66 u[IU]/mL (ref 0.450–4.500)

## 2023-03-14 LAB — VITAMIN D 25 HYDROXY (VIT D DEFICIENCY, FRACTURES): Vit D, 25-Hydroxy: 23.8 ng/mL — ABNORMAL LOW (ref 30.0–100.0)

## 2023-03-16 ENCOUNTER — Other Ambulatory Visit: Payer: Self-pay | Admitting: Family Medicine

## 2023-03-16 DIAGNOSIS — E559 Vitamin D deficiency, unspecified: Secondary | ICD-10-CM

## 2023-03-16 MED ORDER — VITAMIN D (ERGOCALCIFEROL) 1.25 MG (50000 UNIT) PO CAPS: 50000.0000 [IU] | ORAL_CAPSULE | ORAL | 1 refills | Status: DC

## 2023-03-16 MED ORDER — ROSUVASTATIN CALCIUM 10 MG PO TABS: 10.0000 mg | ORAL_TABLET | Freq: Every day | ORAL | 1 refills | Status: DC

## 2023-03-16 NOTE — Progress Notes (Signed)
Please inform the patient that a refill for rosuvastatin 10 mg has been sent to his pharmacy to take as his cholesterol levels are elevated. I also recommend that he decrease his intake of greasy, fatty, and starchy foods while increasing physical activity.  His vitamin D levels are slightly low, and a refill for his weekly vitamin D supplement has also been sent to his pharmacy.  His hemoglobin A1c is 6.1, indicating that he is prediabetic. I recommend that he reduce his intake of high-sugar foods and beverages and increase physical activity.  All other labs are stable.

## 2023-03-17 ENCOUNTER — Other Ambulatory Visit: Payer: Self-pay | Admitting: Family Medicine

## 2023-03-17 DIAGNOSIS — M10071 Idiopathic gout, right ankle and foot: Secondary | ICD-10-CM

## 2023-03-21 ENCOUNTER — Encounter: Payer: Self-pay | Admitting: Family Medicine

## 2023-03-21 ENCOUNTER — Encounter: Payer: Self-pay | Admitting: Urology

## 2023-03-22 ENCOUNTER — Ambulatory Visit: Payer: Self-pay | Admitting: Family Medicine

## 2023-03-22 ENCOUNTER — Ambulatory Visit: Payer: Medicare HMO

## 2023-03-22 ENCOUNTER — Ambulatory Visit (HOSPITAL_COMMUNITY): Payer: Medicare (Managed Care)

## 2023-03-22 NOTE — Telephone Encounter (Signed)
  Chief Complaint: let ear drainage Symptoms: left ear drainage Frequency: x 4 days Pertinent Negatives: Patient denies runny nose, cough, fever, swelling, bleeding from ear, ear pain Disposition: [] ED /[] Urgent Care (no appt availability in office) / [x] Appointment(In office/virtual)/ []  [facility]/ [] Home Care/ [x] Refused Recommended Disposition /[] [facility]/ []  Follow-up with PCP Additional Notes: Patient states in the past his PCP has sent in ear drops of him. Read off patient message encounter from [facility] CMA yesterday that states patient will need to schedule an appointment for evaluation. Offered available appointment with [facility] today for acute visit and patient states he would rather wait until next week when he sees his PCP. Advised patient to call back for any new or worsening symptoms or go to urgent care.  Copied From CRM [id number]. Reason for Triage: Patient has severe ear pain, had requested drops but was instructed to come in for a visit per the request of provider, however, there's nothing available until the 12th. Patient states he cannot wait that long, he is in severe pain.  [phone]   Reason for Disposition  Earache  (Exceptions: brief ear pain of < 60 minutes duration, earache occurring during air travel  Answer Assessment - Initial Assessment Questions 1. LOCATION: Which ear is involved?     Left ear.  2. ONSET: When did the ear start hurting      X 4 days.  3. SEVERITY: How bad is the pain?  (Scale 1-10; mild, moderate or severe)   - MILD (1-3): doesn't interfere with normal activities    - MODERATE (4-7): interferes with normal activities or awakens from sleep    - SEVERE (8-10): excruciating pain, unable to do any normal activities      Denies any pain/pressure/discomfort.  4. URI SYMPTOMS: Do you have a runny nose or cough?     Denies.  5. FEVER: Do you have a fever? If Yes, ask: What is your temperature, how was it  measured, and when did it start?     Denies.  6. CAUSE: Have you been swimming recently?, How often do you use Q-TIPS?, Have you had any recent air travel or scuba diving?     Denies any swimming or submerging ear in water. Denies using Q tips.  7. OTHER SYMPTOMS: Do you have any other symptoms? (e.g., headache, stiff neck, dizziness, vomiting, runny nose, decreased hearing)     Ear drainage from left ear (denies any color).  Protocols used: [place]illa

## 2023-03-26 NOTE — Telephone Encounter (Signed)
 Kindly report to the ED

## 2023-03-27 NOTE — Telephone Encounter (Signed)
 Not able to contact pt. Lvm for him to report to the ED.

## 2023-03-30 ENCOUNTER — Encounter: Payer: Self-pay | Admitting: Family Medicine

## 2023-03-30 ENCOUNTER — Ambulatory Visit (INDEPENDENT_AMBULATORY_CARE_PROVIDER_SITE_OTHER): Payer: Medicare HMO | Admitting: Family Medicine

## 2023-03-30 VITALS — BP 167/67 | HR 101 | Resp 16 | Ht 72.0 in | Wt 230.0 lb

## 2023-03-30 DIAGNOSIS — R0683 Snoring: Secondary | ICD-10-CM | POA: Diagnosis not present

## 2023-03-30 DIAGNOSIS — H9212 Otorrhea, left ear: Secondary | ICD-10-CM

## 2023-03-30 MED ORDER — OFLOXACIN 0.3 % OT SOLN: OTIC | 1 refills | Status: DC

## 2023-03-30 NOTE — Progress Notes (Unsigned)
Acute Office Visit  Subjective:    Patient ID: Stephen Fuller, male    DOB: December 28, 1955, 68 y.o.   MRN: [id number]  Chief Complaint  Patient presents with   Ear Pain    Left ear drainage, brownish in color. Started about a week ago. He used some of his friends prescription drops until he could come in for his appt.    Constipation    His pain management Dr is recommending he get a prescription for colace from his pcp. Advised him it was otc    HPI The patient is seen today with complaints of left ear drainage, described as brownish in color, which started several weeks ago. He denies fever, chills, or hearing loss in the affected ear. Further evaluation is needed to determine the cause, such as otitis externa, chronic otitis media, or another underlying condition.  Snoring: The patient complains of excessive snoring, and his wife has observed apnea episodes while sleeping. He is requesting evaluation for a home sleep study to assess for obstructive sleep apnea (OSA).   Past Medical History:  Diagnosis Date   Anxiety 2022   Arthritis    Chest pain    Hepatitis C virus infection cured after antiviral drug therapy    Hypertension    Palpitations     Past Surgical History:  Procedure Laterality Date   CHOLECYSTECTOMY  2005   COLONOSCOPY  2019   LIVER BIOPSY  2019    Family History  Problem Relation Age of Onset   Diabetes Mother    Stroke Mother    Hypertension Mother    Heart disease Other        No family history   Diabetes Brother    Cirrhosis Brother    Colon cancer Neg Hx     Social History   Socioeconomic History   Marital status: Married    Spouse name: Not on file   Number of children: 2   Years of education: Not on file   Highest education level: Never attended school  Occupational History   Occupation: UNEMPLOYED  Tobacco Use   Smoking status: Every Day    Current packs/day: 0.50    Average packs/day: 0.5 packs/day for 37.0 years (18.5 ttl pk-yrs)     Types: Cigarettes   Smokeless tobacco: Never   Tobacco comments:    1/2 a pack a day  Vaping Use   Vaping status: Never Used  Substance and Sexual Activity   Alcohol use: Not Currently    Alcohol/week: 2.0 standard drinks of alcohol    Types: 2 Standard drinks or equivalent per week    Comment: Occasional   Drug use: No   Sexual activity: Not on file  Other Topics Concern   Not on file  Social History Narrative   Not on file   Social Drivers of Health   Financial Resource Strain: Low Risk  (03/02/2023)   Overall Financial Resource Strain (CARDIA)    Difficulty of Paying Living Expenses: Not hard at all  Recent Concern: Financial Resource Strain - Medium Risk (01/08/2023)   Overall Financial Resource Strain (CARDIA)    Difficulty of Paying Living Expenses: Somewhat hard  Food Insecurity: No Food Insecurity (03/02/2023)   Hunger Vital Sign    Worried About Running Out of Food in the Last Year: Never true    Ran Out of Food in the Last Year: Never true  Recent Concern: Food Insecurity - Food Insecurity Present (01/08/2023)   Hunger Vital Sign  Worried About Programme researcher, broadcasting/film/video in the Last Year: Never true    Ran Out of Food in the Last Year: Sometimes true  Transportation Needs: No Transportation Needs (03/02/2023)   PRAPARE - Administrator, Civil Service (Medical): No    Lack of Transportation (Non-Medical): No  Physical Activity: Sufficiently Active (03/02/2023)   Exercise Vital Sign    Days of Exercise per Week: 4 days    Minutes of Exercise per Session: 60 min  Stress: Stress Concern Present (03/02/2023)   [employer] of Occupational [employer] - Occupational Stress Questionnaire    Feeling of Stress : To some extent  Social Connections: Socially Isolated (03/02/2023)   Social Connection and Isolation Panel [NHANES]    Frequency of Communication with Friends and Family: Once a week    Frequency of Social Gatherings with Friends and Family: Once a week     Attends Religious Services: 1 to 4 times per year    Active Member of [employer] or Organizations: No    Attends Banker Meetings: Never    Marital Status: Separated  Intimate Partner Violence: Not At Risk (01/11/2023)   Humiliation, Afraid, Rape, and Kick questionnaire    Fear of Current or Ex-Partner: No    Emotionally Abused: No    Physically Abused: No    Sexually Abused: No    Outpatient Medications Prior to Visit  Medication Sig Dispense Refill   allopurinol (ZYLOPRIM) 300 MG tablet take 1 tablet once daily. 30 tablet 0   amLODipine-olmesartan (AZOR) 10-40 MG tablet Take 1 tablet by mouth daily. 90 tablet 1   busPIRone (BUSPAR) 5 MG tablet Take by mouth.     diclofenac Sodium (VOLTAREN) 1 % GEL Apply 2 g topically 4 (four) times daily. 2 g 0   gabapentin (NEURONTIN) 400 MG capsule Take by mouth.     GOODSENSE ASPIRIN LOW DOSE 81 MG tablet take 1 tablet once daily. 30 tablet 0   hydrochlorothiazide (HYDRODIURIL) 12.5 MG tablet TAKE (1/2) TABLET BY MOUTH DAILY. 60 tablet 1   HYDROmorphone HCl (EXALGO) 8 MG TB24 Take 8 mg by mouth daily.     oxyCODONE-acetaminophen (PERCOCET) 10-325 MG tablet Take 1 tablet by mouth 4 (four) times daily as needed.     rosuvastatin (CRESTOR) 10 MG tablet Take 1 tablet (10 mg total) by mouth at bedtime. 90 tablet 1   tadalafil (CIALIS) 20 MG tablet take ONE-HALF (1/2) to 1 tablet every other day as needed for erectile dysfunction 15 tablet 0   testosterone cypionate (DEPOTESTOSTERONE CYPIONATE) 200 MG/ML injection INJECT 0.5 MLS (100 MG TOTAL) INTO THE MUSCLE EVERY 7 (SEVEN) DAYS. 4 mL 2   Vitamin D, Ergocalciferol, (DRISDOL) 1.25 MG (50000 UNIT) CAPS capsule Take 1 capsule (50,000 Units total) by mouth every 7 (seven) days. 20 capsule 1   XTAMPZA ER 13.5 MG C12A Take 1 capsule by mouth every 12 (twelve) hours.     buprenorphine (BUTRANS) 7.5 MCG/HR SMARTSIG:Topical (Patient not taking: Reported on 03/30/2023)     azithromycin (ZITHROMAX) 250 MG  tablet Take 2 tablets on day 1, then 1 tablet daily on days 2 through 5 6 tablet 0   benzonatate (TESSALON) 200 MG capsule Take 1 capsule (200 mg total) by mouth 2 (two) times daily as needed for cough. 20 capsule 1   No facility-administered medications prior to visit.    Allergies  Allergen Reactions   Morphine And Codeine Other (See Comments)    abd pain and inability  to urinate    Review of Systems  Constitutional:  Negative for fatigue and fever.  HENT:  Positive for ear discharge.   Eyes:  Negative for visual disturbance.  Respiratory:  Negative for chest tightness and shortness of breath.   Cardiovascular:  Negative for chest pain and palpitations.  Neurological:  Negative for dizziness and headaches.       Objective:    Physical Exam HENT:     Left Ear: Drainage and tenderness present. Tympanic membrane is bulging.     BP (!) 167/67   Pulse (!) 101   Resp 16   Ht 6' (1.829 m)   Wt 230 lb (104.3 kg)   SpO2 94%   BMI 31.19 kg/m  Wt Readings from Last 3 Encounters:  03/30/23 230 lb (104.3 kg)  03/06/23 233 lb 0.6 oz (105.7 kg)  01/11/23 215 lb (97.5 kg)       Assessment & Plan:  Drainage from left ear Assessment & Plan: The patient was encouraged to apply Ofloxacin 0.3% otic solution, instilling 5 drops into the left ear twice daily (BID). He was advised to keep the ear dry, avoiding swimming or getting water in the ear while using the drops. The patient was also instructed not to insert Q-tips or any objects into the ear to prevent further irritation or injury. He was encouraged to monitor for worsening symptoms, such as increased pain, fever, hearing loss, or foul-smelling discharge, and to follow up if symptoms persist or worsen.   Orders: -     Ofloxacin; Place 5 drops into the left ear twice daily for 7 days  Dispense: 5 mL; Refill: 1  Snoring Assessment & Plan: Referral placed for a home sleep study to assess for obstructive sleep  apnea. Encourage lifestyle modifications such as weight management, avoiding alcohol before bed, and sleeping on the side instead of the back. Discuss potential CPAP therapy or other treatment options if sleep apnea is diagnosed.   Orders: -     Home sleep test  Note: This chart has been completed using Engineer, civil (consulting) software, and while attempts have been made to ensure accuracy, certain words and phrases may not be transcribed as intended.    [name], FNP

## 2023-03-30 NOTE — Patient Instructions (Addendum)
I appreciate the opportunity to provide care to you today!    Left Ear Drainage - Treatment Plan Start applying Ofloxacin 0.3% otic solution: Instill 5 drops into the left ear twice daily (BID). Keep the ear dry - Avoid swimming or getting water in the ear while using the drops. Do not insert Q-tips or any objects into the ear. Monitor for worsening symptoms, such as pain, fever, hearing loss, or foul-smelling discharge, and follow up if symptoms persist or worsen.    Referrals today-  Home sleep study     Please continue to a heart-healthy diet and increase your physical activities. Try to exercise for at least five days a week.    It was a pleasure to see you and I look forward to continuing to work together on your health and well-being. Please do not hesitate to call the office if you need care or have questions about your care.  In case of emergency, please visit the Emergency Department for urgent care, or contact our clinic at [phone] to schedule an appointment. We're here to help you!   Have a wonderful day and week. With Gratitude, [name] MSN, FNP-BC

## 2023-03-31 DIAGNOSIS — R0683 Snoring: Secondary | ICD-10-CM | POA: Insufficient documentation

## 2023-03-31 DIAGNOSIS — H9212 Otorrhea, left ear: Secondary | ICD-10-CM | POA: Insufficient documentation

## 2023-03-31 NOTE — Assessment & Plan Note (Signed)
The patient was encouraged to apply Ofloxacin 0.3% otic solution, instilling 5 drops into the left ear twice daily (BID). He was advised to keep the ear dry, avoiding swimming or getting water in the ear while using the drops. The patient was also instructed not to insert Q-tips or any objects into the ear to prevent further irritation or injury. He was encouraged to monitor for worsening symptoms, such as increased pain, fever, hearing loss, or foul-smelling discharge, and to follow up if symptoms persist or worsen.

## 2023-03-31 NOTE — Assessment & Plan Note (Addendum)
Referral placed for a home sleep study to assess for obstructive sleep apnea. Encourage lifestyle modifications such as weight management, avoiding alcohol before bed, and sleeping on the side instead of the back. Discuss potential CPAP therapy or other treatment options if sleep apnea is diagnosed.

## 2023-04-07 ENCOUNTER — Other Ambulatory Visit: Payer: Medicare HMO

## 2023-04-12 DIAGNOSIS — G4730 Sleep apnea, unspecified: Secondary | ICD-10-CM | POA: Diagnosis not present

## 2023-04-13 ENCOUNTER — Ambulatory Visit: Payer: Medicare HMO | Admitting: Urology

## 2023-04-17 ENCOUNTER — Telehealth: Payer: Self-pay

## 2023-04-17 ENCOUNTER — Other Ambulatory Visit: Payer: Self-pay | Admitting: Family Medicine

## 2023-04-17 DIAGNOSIS — M10071 Idiopathic gout, right ankle and foot: Secondary | ICD-10-CM

## 2023-04-17 DIAGNOSIS — N528 Other male erectile dysfunction: Secondary | ICD-10-CM

## 2023-04-17 NOTE — Telephone Encounter (Signed)
 Medication prior authorization request received.  Completed PA request through cover my meds for drug Testosterone Cypionate. KEY:[id number]  Approved: Pending

## 2023-04-18 ENCOUNTER — Ambulatory Visit: Admitting: Family Medicine

## 2023-04-18 ENCOUNTER — Ambulatory Visit: Payer: Self-pay | Admitting: Family Medicine

## 2023-04-18 DIAGNOSIS — M5451 Vertebrogenic low back pain: Secondary | ICD-10-CM | POA: Diagnosis not present

## 2023-04-18 DIAGNOSIS — Z79891 Long term (current) use of opiate analgesic: Secondary | ICD-10-CM | POA: Diagnosis not present

## 2023-04-18 DIAGNOSIS — M5417 Radiculopathy, lumbosacral region: Secondary | ICD-10-CM | POA: Diagnosis not present

## 2023-04-18 DIAGNOSIS — G894 Chronic pain syndrome: Secondary | ICD-10-CM | POA: Diagnosis not present

## 2023-04-18 DIAGNOSIS — M542 Cervicalgia: Secondary | ICD-10-CM | POA: Diagnosis not present

## 2023-04-18 DIAGNOSIS — F411 Generalized anxiety disorder: Secondary | ICD-10-CM | POA: Diagnosis not present

## 2023-04-18 NOTE — Telephone Encounter (Signed)
 Chief Complaint: Cough with phlegm, sinus congestion Symptoms: see above Frequency: 3 days Pertinent Negatives: Patient denies fever Disposition: [] ED /[] Urgent Care (no appt availability in office) / [x] Appointment(In office/virtual)/ []  [facility]/ [] Home Care/ [] Refused Recommended Disposition /[] [facility]/ []  Follow-up with PCP Additional Notes: Patient called in stating he was supposed to have a virtual appointment today and was notified that provider was running late, but patient stated he waited up to 30 minutes and provider did not come onto the call so he hung up and called into the office. Patient appt rescheduled for tomorrow. Patient having cough with [place] phlegm, nasal congestion and chest congestion with wheezing. Patient denies fever, denies known exposure to covid or the flu.   Copied from CRM [id number]. Topic: Clinical - Red Word Triage >> Apr 18, 2023  5:39 PM Emylou G wrote: [facility] that prompted transfer to Nurse Triage: increased cough.. doctor didn't show up to his mychart appt. Reason for Disposition  SEVERE coughing spells (e.g., whooping sound after coughing, vomiting after coughing)  Answer Assessment - Initial Assessment Questions 1. ONSET: "When did the cough begin?"      3 days 2. SEVERITY: "How bad is the cough today?"      Intermittent 3. SPUTUM: "Describe the color of your sputum" (none, dry cough; clear, white, yellow, green)     [place] 4. HEMOPTYSIS: "Are you coughing up any blood?" If so ask: "How much?" (flecks, streaks, tablespoons, etc.)     No 5. DIFFICULTY BREATHING: "Are you having difficulty breathing?" If Yes, ask: "How bad is it?" (e.g., mild, moderate, severe)    - MILD: No SOB at rest, mild SOB with walking, speaks normally in sentences, can lie down, no retractions, pulse < 100.    - MODERATE: SOB at rest, SOB with minimal exertion and prefers to sit, cannot lie down flat, speaks in phrases, mild retractions,  audible wheezing, pulse 100-120.    - SEVERE: Very SOB at rest, speaks in single words, struggling to breathe, sitting hunched forward, retractions, pulse > 120      Head congestion, nasal congestion  6. FEVER: "Do you have a fever?" If Yes, ask: "What is your temperature, how was it measured, and when did it start?"     No 7. CARDIAC HISTORY: "Do you have any history of heart disease?" (e.g., heart attack, congestive heart failure)      No 8. LUNG HISTORY: "Do you have any history of lung disease?"  (e.g., pulmonary embolus, asthma, emphysema)     No 9. PE RISK FACTORS: "Do you have a history of blood clots?" (or: recent major surgery, recent prolonged travel, bedridden)     No 10. OTHER SYMPTOMS: "Do you have any other symptoms?" (e.g., runny nose, wheezing, chest pain)       Wheezing 12. TRAVEL: "Have you traveled out of the country in the last month?" (e.g., travel history, exposures)       No  Protocols used: Cough - Acute Productive-A-AH

## 2023-04-19 ENCOUNTER — Telehealth: Payer: Self-pay | Admitting: Family Medicine

## 2023-04-19 VITALS — Wt 225.0 lb

## 2023-04-19 DIAGNOSIS — J069 Acute upper respiratory infection, unspecified: Secondary | ICD-10-CM

## 2023-04-19 MED ORDER — ALBUTEROL SULFATE HFA 108 (90 BASE) MCG/ACT IN AERS: 2.0000 | INHALATION_SPRAY | Freq: Four times a day (QID) | RESPIRATORY_TRACT | 2 refills | Status: AC | PRN

## 2023-04-19 MED ORDER — PROMETHAZINE-DM 6.25-15 MG/5ML PO SYRP: 5.0000 mL | ORAL_SOLUTION | Freq: Four times a day (QID) | ORAL | 0 refills | Status: DC | PRN

## 2023-04-19 MED ORDER — AZITHROMYCIN 250 MG PO TABS
ORAL_TABLET | ORAL | 0 refills | Status: DC
Start: 2023-04-19 — End: 2023-07-06

## 2023-04-19 NOTE — Assessment & Plan Note (Signed)
 Azithromycin 250 mg twice daily x 5 days,  promethazine syrup PRN, albuterol prn Advised patient to rest, stay hydrated, and use a humidifier for comfort. Recommend acetaminophen (Tylenol) for fever or pain, saline nasal spray, lozenges, and saltwater gargles for symptom relief. Encouraged light, healthy meals and good hygiene. Patient understands plan of care and will follow up if symptoms worsen.

## 2023-04-19 NOTE — Progress Notes (Signed)
 Virtual Visit via Video Note  I connected with [name] on 04/19/23 at 10:00 AM EST by a video enabled telemedicine application and verified that I am speaking with the correct person using two identifiers.  Patient Location: Home Provider Location: Office/Clinic  I discussed the limitations, risks, security, and privacy concerns of performing an evaluation and management service by video and the availability of in person appointments. I also discussed with the patient that there may be a patient responsible charge related to this service. The patient expressed understanding and agreed to proceed.  Subjective: PCP: [name], FNP  Chief Complaint  Patient presents with   Cough    X 3-4 days, Head is stopped up completely, coughing up light [place] phlegm, congested in chest. No fever.    Patient complains of persistent cough. Patient describes symptoms of cough, fatigue, malaise, sputum production, wheezing, and nasal congestion. Symptoms began several days ago and are gradually worsening since that time. Patient denies dyspnea or nausea and vomiting. Treatment thus far includes OTC analgesics/antipyretics: not very effective and anti-tussive: not very effective Past pulmonary history is significant for no bronchitis        ROS: Per HPI  Current Outpatient Medications:    albuterol (VENTOLIN HFA) 108 (90 Base) MCG/ACT inhaler, Inhale 2 puffs into the lungs every 6 (six) hours as needed for wheezing or shortness of breath., Disp: 8 g, Rfl: 2   allopurinol (ZYLOPRIM) 300 MG tablet, take 1 tablet once daily., Disp: 30 tablet, Rfl: 0   amLODipine-olmesartan (AZOR) 10-40 MG tablet, Take 1 tablet by mouth daily., Disp: 90 tablet, Rfl: 1   azithromycin (ZITHROMAX) 250 MG tablet, Take 2 tablets on day 1, then 1 tablet daily on days 2 through 5, Disp: 6 tablet, Rfl: 0   busPIRone (BUSPAR) 5 MG tablet, Take by mouth., Disp: , Rfl:    diclofenac Sodium (VOLTAREN) 1 % GEL, Apply 2  g topically 4 (four) times daily., Disp: 2 g, Rfl: 0   gabapentin (NEURONTIN) 400 MG capsule, Take by mouth., Disp: , Rfl:    GOODSENSE ASPIRIN LOW DOSE 81 MG tablet, take 1 tablet once daily., Disp: 30 tablet, Rfl: 0   hydrochlorothiazide (HYDRODIURIL) 12.5 MG tablet, TAKE (1/2) TABLET BY MOUTH DAILY., Disp: 60 tablet, Rfl: 1   HYDROmorphone HCl (EXALGO) 8 MG TB24, Take 8 mg by mouth daily., Disp: , Rfl:    ofloxacin (FLOXIN) 0.3 % OTIC solution, Place 5 drops into the left ear twice daily for 7 days, Disp: 5 mL, Rfl: 1   oxyCODONE-acetaminophen (PERCOCET) 10-325 MG tablet, Take 1 tablet by mouth 4 (four) times daily as needed., Disp: , Rfl:    promethazine-dextromethorphan (PROMETHAZINE-DM) 6.25-15 MG/5ML syrup, Take 5 mLs by mouth 4 (four) times daily as needed., Disp: 118 mL, Rfl: 0   rosuvastatin (CRESTOR) 10 MG tablet, Take 1 tablet (10 mg total) by mouth at bedtime., Disp: 90 tablet, Rfl: 1   tadalafil (CIALIS) 20 MG tablet, take one-half (1/2) to 1 tablet every other day as needed for erectile dysfunction, Disp: 15 tablet, Rfl: 0   testosterone cypionate (DEPOTESTOSTERONE CYPIONATE) 200 MG/ML injection, INJECT 0.5 MLS (100 MG TOTAL) INTO THE MUSCLE EVERY 7 (SEVEN) DAYS., Disp: 4 mL, Rfl: 2   Vitamin D, Ergocalciferol, (DRISDOL) 1.25 MG (50000 UNIT) CAPS capsule, Take 1 capsule (50,000 Units total) by mouth every 7 (seven) days., Disp: 20 capsule, Rfl: 1   XTAMPZA ER 13.5 MG C12A, Take 1 capsule by mouth every 12 (twelve)  hours., Disp: , Rfl:   Observations/Objective: Today's Vitals   04/19/23 0950  Weight: 225 lb (102.1 kg)   Physical Exam Patient is alert and no acute distress noted.  Assessment and Plan: Upper respiratory tract infection, unspecified type Assessment & Plan: Azithromycin 250 mg twice daily x 5 days,  promethazine syrup PRN, albuterol prn Advised patient to rest, stay hydrated, and use a humidifier for comfort. Recommend acetaminophen (Tylenol) for fever or pain,  saline nasal spray, lozenges, and saltwater gargles for symptom relief. Encouraged light, healthy meals and good hygiene. Patient understands plan of care and will follow up if symptoms worsen.  Orders: -     Albuterol Sulfate HFA; Inhale 2 puffs into the lungs every 6 (six) hours as needed for wheezing or shortness of breath.  Dispense: 8 g; Refill: 2 -     Azithromycin; Take 2 tablets on day 1, then 1 tablet daily on days 2 through 5  Dispense: 6 tablet; Refill: 0 -     Promethazine-DM; Take 5 mLs by mouth 4 (four) times daily as needed.  Dispense: 118 mL; Refill: 0    Follow Up Instructions: No follow-ups on file.   I discussed the assessment and treatment plan with the patient. The patient was provided an opportunity to ask questions, and all were answered. The patient agreed with the plan and demonstrated an understanding of the instructions.   The patient was advised to call back or seek an in-person evaluation if the symptoms worsen or if the condition fails to improve as anticipated.  The above assessment and management plan was discussed with the patient. The patient verbalized understanding of and has agreed to the management plan.   Stephen Lederer Newman Nip, FNP

## 2023-04-20 ENCOUNTER — Other Ambulatory Visit: Payer: Self-pay | Admitting: Family Medicine

## 2023-04-20 DIAGNOSIS — J069 Acute upper respiratory infection, unspecified: Secondary | ICD-10-CM

## 2023-04-20 NOTE — Telephone Encounter (Signed)
 Please see note below: med loaded with correct pharmacy  Copied from CRM [id number]. Topic: Clinical - Prescription Issue >> Apr 20, 2023  4:33 PM Shon Hale wrote: Reason for CRM: [facility] cannot fill promethazine-dextromethorphan (PROMETHAZINE-DM) 6.25-15 MG/5ML syrup prescription. It is on back order.   Patient requesting rx to be sent to [facility] - [address], [place] [address]

## 2023-04-21 ENCOUNTER — Ambulatory Visit: Payer: Self-pay | Admitting: Family Medicine

## 2023-04-21 ENCOUNTER — Other Ambulatory Visit: Payer: Self-pay | Admitting: Family Medicine

## 2023-04-21 DIAGNOSIS — J069 Acute upper respiratory infection, unspecified: Secondary | ICD-10-CM

## 2023-04-21 MED ORDER — PROMETHAZINE-DM 6.25-15 MG/5ML PO SYRP
5.0000 mL | ORAL_SOLUTION | Freq: Four times a day (QID) | ORAL | 0 refills | Status: DC | PRN
Start: 2023-04-21 — End: 2023-07-06

## 2023-04-21 NOTE — Telephone Encounter (Signed)
 Had virtual visit on 04/19/23, prescribed inhaler, cough medicine, zpac. Inhaler not effective. Increased congestion, mucous is making him wake up during the night. "Wheezing". Speaking in full sentences on the call. Caller unable to hear nurse, will need call back advising urgent care since no visits available today. Attempted to call back twice but he is unable to hear this writer speak.    Copied from CRM [id number]. Topic: Clinical - Red Word Triage >> Apr 21, 2023 12:17 PM Alessandra Bevels wrote: Red Word that prompted transfer to Nurse Triage: Pt is calling to report had a virtual appointment on 04/19/2023. Reporting that the medication is not helping. Reporting wheezing and congestion. Reason for Disposition  [1] MILD difficulty breathing (e.g., minimal/no SOB at rest, SOB with walking, pulse <100) AND [2] still present when not coughing  Answer Assessment - Initial Assessment Questions 1. ONSET: "When did the cough begin?"      5 days ago 2. SEVERITY: "How bad is the cough today?"      Worsening 3. SPUTUM: "Describe the color of your sputum" (none, dry cough; clear, white, yellow, green)     [place] mucous 4. HEMOPTYSIS: "Are you coughing up any blood?" If so ask: "How much?" (flecks, streaks, tablespoons, etc.)     denies 5. DIFFICULTY BREATHING: "Are you having difficulty breathing?" If Yes, ask: "How bad is it?" (e.g., mild, moderate, severe)    - MILD: No SOB at rest, mild SOB with walking, speaks normally in sentences, can lie down, no retractions, pulse < 100.    - MODERATE: SOB at rest, SOB with minimal exertion and prefers to sit, cannot lie down flat, speaks in phrases, mild retractions, audible wheezing, pulse 100-120.    - SEVERE: Very SOB at rest, speaks in single words, struggling to breathe, sitting hunched forward, retractions, pulse > 120      Moderate due to mucous 6. FEVER: "Do you have a fever?" If Yes, ask: "What is your temperature, how was it measured, and when did it start?"      Afebrile 7. CARDIAC HISTORY: "Do you have any history of heart disease?" (e.g., heart attack, congestive heart failure)      No 8. LUNG HISTORY: "Do you have any history of lung disease?"  (e.g., pulmonary embolus, asthma, emphysema)     No 9. PE RISK FACTORS: "Do you have a history of blood clots?" (or: recent major surgery, recent prolonged travel, bedridden)     NO 10. OTHER SYMPTOMS: "Do you have any other symptoms?" (e.g., runny nose, wheezing, chest pain)       none  Protocols used: Cough - Acute Productive-A-AH

## 2023-04-21 NOTE — Telephone Encounter (Signed)
 Hi patient was seen :)

## 2023-04-21 NOTE — Telephone Encounter (Signed)
 sent

## 2023-04-21 NOTE — Telephone Encounter (Signed)
 Patient is calling back after call dropped during conversation with another triage RN. Per disposition, patient needs to be seen within 4 hours. No availability with PCP or in PCP office. No availability with offices within reasonable driving distance. This RN advised UC. Patient declined. Patient stated he would consider UC if symptoms worsened. This RN encouraged patient to call back if symptoms worsen.

## 2023-04-28 ENCOUNTER — Other Ambulatory Visit: Payer: Self-pay | Admitting: Family Medicine

## 2023-04-28 NOTE — Progress Notes (Signed)
 The patient's sleep study results indicated severe obstructive sleep apnea.

## 2023-05-01 ENCOUNTER — Telehealth: Payer: Self-pay

## 2023-05-01 NOTE — Telephone Encounter (Signed)
 The patient's sleep study results indicated severe obstructive sleep apnea.orders have been placed for a CPAP

## 2023-05-01 NOTE — Telephone Encounter (Signed)
 Copied from CRM [id number]. Topic: General - Other >> May 01, 2023  2:17 PM Eunice Blase wrote: Reason for CRM: Pt called for results of Home sleep test (Order [id number]). Please call pt at  [phone].

## 2023-05-04 NOTE — Telephone Encounter (Signed)
 Copied from CRM [id number]. Topic: Referral - Status >> May 03, 2023  8:06 AM Carlatta H wrote: Reason for CRM: Called to advised that patients insurance is out of network with [facility]

## 2023-05-14 ENCOUNTER — Other Ambulatory Visit: Payer: Self-pay | Admitting: Urology

## 2023-05-14 ENCOUNTER — Other Ambulatory Visit: Payer: Self-pay | Admitting: Family Medicine

## 2023-05-14 DIAGNOSIS — M10071 Idiopathic gout, right ankle and foot: Secondary | ICD-10-CM

## 2023-05-16 ENCOUNTER — Other Ambulatory Visit: Payer: Medicare HMO

## 2023-05-16 DIAGNOSIS — R972 Elevated prostate specific antigen [PSA]: Secondary | ICD-10-CM

## 2023-05-16 DIAGNOSIS — E230 Hypopituitarism: Secondary | ICD-10-CM | POA: Diagnosis not present

## 2023-05-17 LAB — TESTOSTERONE: Testosterone: 456 ng/dL (ref 264–916)

## 2023-05-17 LAB — PSA, TOTAL AND FREE
PSA, Free Pct: 23.3 %
PSA, Free: 1.19 ng/mL
Prostate Specific Ag, Serum: 5.1 ng/mL — ABNORMAL HIGH (ref 0.0–4.0)

## 2023-05-17 LAB — HEMOGLOBIN AND HEMATOCRIT, BLOOD
Hematocrit: 46.1 % (ref 37.5–51.0)
Hemoglobin: 15.9 g/dL (ref 13.0–17.7)

## 2023-05-25 ENCOUNTER — Encounter: Payer: Self-pay | Admitting: Urology

## 2023-05-25 ENCOUNTER — Ambulatory Visit (INDEPENDENT_AMBULATORY_CARE_PROVIDER_SITE_OTHER): Payer: Medicare HMO | Admitting: Urology

## 2023-05-25 VITALS — BP 125/69 | HR 98

## 2023-05-25 DIAGNOSIS — R972 Elevated prostate specific antigen [PSA]: Secondary | ICD-10-CM

## 2023-05-25 DIAGNOSIS — E230 Hypopituitarism: Secondary | ICD-10-CM | POA: Diagnosis not present

## 2023-05-25 DIAGNOSIS — N5201 Erectile dysfunction due to arterial insufficiency: Secondary | ICD-10-CM

## 2023-05-25 NOTE — Progress Notes (Signed)
 Subjective: 1. Hypogonadotropic hypogonadism (HCC)   2. Erectile dysfunction due to arterial insufficiency   3. Elevated PSA     05/25/23: Stephen Fuller returns today in f/u his history of hypogonadism, ED and an elevated PSA.  He is on TRT with 100mg  weekly.  His T was 456 on 05/16/23 with a Hgb of 15.9.  His PSA is 5.1 with a 23.3% f/t ratio which is up some from the last but below the level in 1/24 of 5.3.  He has been on tadalafil with a good response.   His IPSS is 1 with nocturia x.1. He had refills sent on 05/15/23 for the testosterone.  He remains on Xtampa for his back pain and he is considering surgery.  His UA is clear.   10/06/22: Stephen Fuller returns today for his history below. He remains on 100mg  T weekly and his testosterone level is 420 with a Hgb of 15.7 and a PSA of 4.3 with a 27% f/t ratio.  His energy level is good.  He has a good response to the tadalafil.  His UA is clear today.    04/14/22: Stephen Fuller returns today in f/u for his history of hypogonadism.  His T is 776 and his Hgb is 16.9 omn 04/07/22.  His is rising and was 4.3 with a 24.9% f/t ratio on 12/16/21 and it is now up to 5.3 with an 18.1% f/t ratio on 02/22/22.  His testosterone level was 286 on 02/22/22 2 months after the testopel and he was run down so he went back to the injectable testosterone 100mg  weekly.    He remains on tadalafil for ED with a good response.     01/13/22: Stephen Fuller returns today in f/u for testopel implant.  He had his labs sent from [place] from 10/28/21 and his PSA was 2.93 with a T of 664 and a Hgb of 16.3.   12/16/21: Stephen Fuller is a 68 yo male who is here to discuss testosterone replacement therapy.  He has been on injectable testosterone for over a year.  He has chronic pain and has been on oxycodone.   He was getting TRT through an MD in [place] but he can't do that anymore because of new regulations. He was getting a pill with the injections but he doesn't know what that was.  He was getting 100mg  weekly.  He had been doing  well on therapy with a T of in the 600's.  He is on tadalafil for ED with success.   He is voiding well with with an IPSS of 0.   He last had labs done about 6 weeks ago.  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  His Cr was 1.31 on 11/24/21 and his LFT's were normal.  His GFR is 60.  ROS:  Review of Systems  Constitutional:  Negative for weight loss.  Respiratory:  Negative for shortness of breath.   Cardiovascular:  Negative for chest pain.  Musculoskeletal:  Positive for back pain and neck pain.    Allergies  Allergen Reactions   Morphine And Codeine Other (See Comments)    abd pain and inability to urinate    Past Medical History:  Diagnosis Date   Anxiety 2022   Arthritis    Chest pain    Hepatitis C virus infection cured after antiviral drug therapy    Hypertension    Palpitations     Past Surgical History:  Procedure Laterality Date   CHOLECYSTECTOMY  2005   COLONOSCOPY  2019   LIVER BIOPSY  2019    Social History   Socioeconomic History   Marital status: Married    Spouse name: Not on file   Number of children: 2   Years of education: Not on file   Highest education level: Never attended school  Occupational History   Occupation: UNEMPLOYED  Tobacco Use   Smoking status: Every Day    Current packs/day: 0.50    Average packs/day: 0.5 packs/day for 37.0 years (18.5 ttl pk-yrs)    Types: Cigarettes   Smokeless tobacco: Never   Tobacco comments:    1/2 a pack a day  Vaping Use   Vaping status: Never Used  Substance and Sexual Activity   Alcohol use: Not Currently    Alcohol/week: 2.0 standard drinks of alcohol    Types: 2 Standard drinks or equivalent per week    Comment: Occasional   Drug use: No   Sexual activity: Not on file  Other Topics Concern   Not on file  Social History Narrative   Not on file   Social Drivers of Health   Financial Resource Strain: Low Risk  (03/02/2023)   Overall Financial Resource  Strain (CARDIA)    Difficulty of Paying Living Expenses: Not hard at all  Recent Concern: Financial Resource Strain - Medium Risk (01/08/2023)   Overall Financial Resource Strain (CARDIA)    Difficulty of Paying Living Expenses: Somewhat hard  Food Insecurity: No Food Insecurity (03/02/2023)   Hunger Vital Sign    Worried About Running Out of Food in the Last Year: Never true    Ran Out of Food in the Last Year: Never true  Recent Concern: Food Insecurity - Food Insecurity Present (01/08/2023)   Hunger Vital Sign    Worried About Running Out of Food in the Last Year: Never true    Ran Out of Food in the Last Year: Sometimes true  Transportation Needs: No Transportation Needs (03/02/2023)   PRAPARE - Administrator, Civil Service (Medical): No    Lack of Transportation (Non-Medical): No  Physical Activity: Sufficiently Active (03/02/2023)   Exercise Vital Sign    Days of Exercise per Week: 4 days    Minutes of Exercise per Session: 60 min  Stress: Stress Concern Present (03/02/2023)   [employer] of Occupational Health - Occupational Stress Questionnaire    Feeling of Stress : To some extent  Social Connections: Socially Isolated (03/02/2023)   Social Connection and Isolation Panel [NHANES]    Frequency of Communication with Friends and Family: Once a week    Frequency of Social Gatherings with Friends and Family: Once a week    Attends Religious Services: 1 to 4 times per year    Active Member of [employer] or Organizations: No    Attends Banker Meetings: Never    Marital Status: Separated  Intimate Partner Violence: Not At Risk (01/11/2023)   Humiliation, Afraid, Rape, and Kick questionnaire    Fear of Current or Ex-Partner: No    Emotionally Abused: No    Physically Abused: No    Sexually Abused: No    Family History  Problem Relation Age of Onset   Diabetes Mother    Stroke Mother    Hypertension Mother    Heart disease Other        No family  history   Diabetes Brother    Cirrhosis Brother    Colon cancer Neg Hx  Anti-infectives: Anti-infectives (From admission, onward)    None       Current Outpatient Medications  Medication Sig Dispense Refill   albuterol (VENTOLIN HFA) 108 (90 Base) MCG/ACT inhaler Inhale 2 puffs into the lungs every 6 (six) hours as needed for wheezing or shortness of breath. 8 g 2   allopurinol (ZYLOPRIM) 300 MG tablet take 1 tablet once daily. 30 tablet 0   amLODipine-olmesartan (AZOR) 10-40 MG tablet Take 1 tablet by mouth daily. 90 tablet 1   azithromycin (ZITHROMAX) 250 MG tablet Take 2 tablets on day 1, then 1 tablet daily on days 2 through 5 6 tablet 0   busPIRone (BUSPAR) 5 MG tablet Take by mouth.     diclofenac Sodium (VOLTAREN) 1 % GEL Apply 2 g topically 4 (four) times daily. 2 g 0   gabapentin (NEURONTIN) 400 MG capsule Take by mouth.     GOODSENSE ASPIRIN LOW DOSE 81 MG tablet take 1 tablet once daily. 30 tablet 0   hydrochlorothiazide (HYDRODIURIL) 12.5 MG tablet TAKE (1/2) TABLET BY MOUTH DAILY. 60 tablet 1   HYDROmorphone HCl (EXALGO) 8 MG TB24 Take 8 mg by mouth daily.     ofloxacin (FLOXIN) 0.3 % OTIC solution Place 5 drops into the left ear twice daily for 7 days 5 mL 1   oxyCODONE-acetaminophen (PERCOCET) 10-325 MG tablet Take 1 tablet by mouth 4 (four) times daily as needed.     promethazine-dextromethorphan (PROMETHAZINE-DM) 6.25-15 MG/5ML syrup Take 5 mLs by mouth 4 (four) times daily as needed. 118 mL 0   rosuvastatin (CRESTOR) 10 MG tablet Take 1 tablet (10 mg total) by mouth at bedtime. 90 tablet 1   tadalafil (CIALIS) 20 MG tablet take one-half (1/2) to 1 tablet every other day as needed for erectile dysfunction 15 tablet 0   testosterone cypionate (DEPOTESTOSTERONE CYPIONATE) 200 MG/ML injection INJECT 0.5 MLS (100 MG TOTAL) INTO THE MUSCLE EVERY 7 (SEVEN) DAYS. 4 mL 2   Vitamin D, Ergocalciferol, (DRISDOL) 1.25 MG (50000 UNIT) CAPS capsule Take 1 capsule (50,000 Units  total) by mouth every 7 (seven) days. 20 capsule 1   XTAMPZA ER 13.5 MG C12A Take 1 capsule by mouth every 12 (twelve) hours.     No current facility-administered medications for this visit.     Objective: Vital signs in last 24 hours: BP 125/69   Pulse 98   Intake/Output from previous day: No intake/output data recorded. Intake/Output this shift: @IOTHISSHIFT @   Physical Exam Vitals reviewed.  Constitutional:      Appearance: Normal appearance.  Neurological:     Mental Status: He is alert.    He declined a rectal exam.   Lab Results:  No results found for this or any previous visit (from the past 24 hours).     Recent Results (from the past 2160 hours)  Hemoglobin A1c     Status: Abnormal   Collection Time: 03/13/23  8:12 AM  Result Value Ref Range   Hgb A1c MFr Bld 6.1 (H) 4.8 - 5.6 %    Comment:          Prediabetes: 5.7 - 6.4          Diabetes: >6.4          Glycemic control for adults with diabetes: <7.0    Est. average glucose Bld gHb Est-mCnc 128 mg/dL  VITAMIN D 25 Hydroxy (Vit-D Deficiency, Fractures)     Status: Abnormal   Collection Time: 03/13/23  8:12 AM  Result Value  Ref Range   Vit D, 25-Hydroxy 23.8 (L) 30.0 - 100.0 ng/mL    Comment: Vitamin D deficiency has been defined by the Institute of Medicine and an Endocrine Society practice guideline as a level of serum 25-OH vitamin D less than 20 ng/mL (1,2). The Endocrine Society went on to further define vitamin D insufficiency as a level between 21 and 29 ng/mL (2). 1. IOM (Institute of Medicine). 2010. Dietary reference    intakes for calcium and D. [place]: The    [employer]. 2. Holick MF, Binkley [place], Bischoff-Ferrari HA, et al.    Evaluation, treatment, and prevention of vitamin D    deficiency: an Endocrine Society clinical practice    guideline. JCEM. 2011 Jul; 96(7):1911-30.   TSH + free T4     Status: None   Collection Time: 03/13/23  8:12 AM  Result Value Ref  Range   TSH 1.660 0.450 - 4.500 uIU/mL   Free T4 1.23 0.82 - 1.77 ng/dL  Lipid panel     Status: Abnormal   Collection Time: 03/13/23  8:12 AM  Result Value Ref Range   Cholesterol, Total 234 (H) 100 - 199 mg/dL   Triglycerides 161 (H) 0 - 149 mg/dL   HDL 38 (L) >09 mg/dL   VLDL Cholesterol Cal 78 (H) 5 - 40 mg/dL   LDL Chol Calc (NIH) 604 (H) 0 - 99 mg/dL   Chol/HDL Ratio 6.2 (H) 0.0 - 5.0 ratio    Comment:                                   T. Chol/HDL Ratio                                             Men  Women                               1/2 Avg.Risk  3.4    3.3                                   Avg.Risk  5.0    4.4                                2X Avg.Risk  9.6    7.1                                3X Avg.Risk 23.4   11.0   CMP14+EGFR     Status: Abnormal   Collection Time: 03/13/23  8:12 AM  Result Value Ref Range   Glucose 142 (H) 70 - 99 mg/dL   BUN 14 8 - 27 mg/dL   Creatinine, Ser 5.40 0.76 - 1.27 mg/dL   eGFR 68 >98 JX/BJY/7.82   BUN/Creatinine Ratio 12 10 - 24   Sodium 137 134 - 144 mmol/L   Potassium 4.2 3.5 - 5.2 mmol/L   Chloride 98 96 - 106 mmol/L   CO2 24 20 - 29 mmol/L   Calcium 10.0 8.6 - 10.2 mg/dL   Total  Protein 6.8 6.0 - 8.5 g/dL   Albumin 4.6 3.9 - 4.9 g/dL   Globulin, Total 2.2 1.5 - 4.5 g/dL   Bilirubin Total 0.4 0.0 - 1.2 mg/dL   Alkaline Phosphatase 74 44 - 121 IU/L   AST 17 0 - 40 IU/L   ALT 32 0 - 44 IU/L  CBC with Differential/Platelet     Status: Abnormal   Collection Time: 03/13/23  8:12 AM  Result Value Ref Range   WBC 9.9 3.4 - 10.8 x10E3/uL   RBC 5.19 4.14 - 5.80 x10E6/uL   Hemoglobin 16.0 13.0 - 17.7 g/dL   Hematocrit 42.5 95.6 - 51.0 %   MCV 90 79 - 97 fL   MCH 30.8 26.6 - 33.0 pg   MCHC 34.3 31.5 - 35.7 g/dL   RDW 38.7 56.4 - 33.2 %   Platelets 229 150 - 450 x10E3/uL   Neutrophils 43 Not Estab. %   Lymphs 46 Not Estab. %   Monocytes 7 Not Estab. %   Eos 3 Not Estab. %   Basos 1 Not Estab. %   Neutrophils Absolute 4.2 1.4  - 7.0 x10E3/uL   Lymphocytes Absolute 4.6 (H) 0.7 - 3.1 x10E3/uL   Monocytes Absolute 0.7 0.1 - 0.9 x10E3/uL   EOS (ABSOLUTE) 0.3 0.0 - 0.4 x10E3/uL   Basophils Absolute 0.1 0.0 - 0.2 x10E3/uL   Immature Granulocytes 0 Not Estab. %   Immature Grans (Abs) 0.0 0.0 - 0.1 x10E3/uL  PSA, total and free     Status: Abnormal   Collection Time: 05/16/23 10:21 AM  Result Value Ref Range   Prostate Specific Ag, Serum 5.1 (H) 0.0 - 4.0 ng/mL    Comment: Roche ECLIA methodology. According to the American Urological Association, Serum PSA should decrease and remain at undetectable levels after radical prostatectomy. The AUA defines biochemical recurrence as an initial PSA value 0.2 ng/mL or greater followed by a subsequent confirmatory PSA value 0.2 ng/mL or greater. Values obtained with different assay methods or kits cannot be used interchangeably. Results cannot be interpreted as absolute evidence of the presence or absence of malignant disease.    PSA, Free 1.19 N/A ng/mL    Comment: Roche ECLIA methodology.   PSA, Free Pct 23.3 %    Comment: The table below lists the probability of prostate cancer for men with non-suspicious DRE results and total PSA between 4 and 10 ng/mL, by patient age Damaris Schooner, JAMA 1998, [id number]).                   % Free PSA       50-64 yr        65-75 yr                   0.00-10.00%        56%             55%                  10.01-15.00%        24%             35%                  15.01-20.00%        17%             23%                  20.01-25.00%  10%             20%                       >25.00%         5%              9% Please note:  Catalona et al did not make specific               recommendations regarding the use of               percent free PSA for any other population               of men.   Testosterone     Status: None   Collection Time: 05/16/23 10:21 AM  Result Value Ref Range   Testosterone 456 264 - 916 ng/dL    Comment:  Adult male reference interval is based on a population of healthy nonobese males (BMI <30) between 58 and 26 years old. [place], et.al. JCEM [phone]. PMID: [id number].   Hemoglobin and hematocrit, blood     Status: None   Collection Time: 05/16/23 10:21 AM  Result Value Ref Range   Hemoglobin 15.9 13.0 - 17.7 g/dL   Hematocrit 62.1 30.8 - 51.0 %    BMET No results for input(s): "NA", "K", "CL", "CO2", "GLUCOSE", "BUN", "CREATININE", "CALCIUM" in the last 72 hours. PT/INR No results for input(s): "LABPROT", "INR" in the last 72 hours. ABG No results for input(s): "PHART", "HCO3" in the last 72 hours.  Invalid input(s): "PCO2", "PO2" Labs in EPIC reviewed including CMP and CBC.  Studies/Results: No results found.    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  He will continue weekly injections and return in 6 months with labs.     ED.  He is doing well on Tadalafil.  Elevated PSA.  His PSA is 5.1 with a f/t ratio of 23.4%.  He is not interested in an MRI or biopsy.  No orders of the defined types were placed in this encounter.    Orders Placed This Encounter  Procedures   Urinalysis, Routine w reflex microscopic   PSA, total and free    Standing Status:   Future    Expected Date:   11/24/2023    Expiration Date:   05/24/2024   Testosterone    Standing Status:   Future    Expected Date:   11/24/2023    Expiration Date:   05/24/2024   Hemoglobin and hematocrit, blood    Standing Status:   Future    Expected Date:   11/24/2023    Expiration Date:   05/24/2024     Return in about 6 months (around 11/24/2023) for available provider with labs. .    CC: [name] NP.      Bjorn Pippin 05/25/2023

## 2023-05-26 LAB — URINALYSIS, ROUTINE W REFLEX MICROSCOPIC
Bilirubin, UA: NEGATIVE
Glucose, UA: NEGATIVE
Ketones, UA: NEGATIVE
Leukocytes,UA: NEGATIVE
Nitrite, UA: NEGATIVE
Protein,UA: NEGATIVE
RBC, UA: NEGATIVE
Specific Gravity, UA: 1.02 (ref 1.005–1.030)
Urobilinogen, Ur: 0.2 mg/dL (ref 0.2–1.0)
pH, UA: 6 (ref 5.0–7.5)

## 2023-06-01 ENCOUNTER — Ambulatory Visit: Payer: Medicare HMO | Admitting: Urology

## 2023-06-07 NOTE — Telephone Encounter (Signed)
 Ordered through [facility]. Order has been accepted and waiting for delivery information to post

## 2023-06-07 NOTE — Telephone Encounter (Signed)
 Checking with [facility] to see who he would be in network with

## 2023-06-15 ENCOUNTER — Other Ambulatory Visit: Payer: Self-pay | Admitting: Family Medicine

## 2023-06-15 DIAGNOSIS — M10071 Idiopathic gout, right ankle and foot: Secondary | ICD-10-CM

## 2023-06-15 DIAGNOSIS — N528 Other male erectile dysfunction: Secondary | ICD-10-CM

## 2023-06-20 DIAGNOSIS — F411 Generalized anxiety disorder: Secondary | ICD-10-CM | POA: Diagnosis not present

## 2023-06-20 DIAGNOSIS — M5417 Radiculopathy, lumbosacral region: Secondary | ICD-10-CM | POA: Diagnosis not present

## 2023-06-20 DIAGNOSIS — Z79891 Long term (current) use of opiate analgesic: Secondary | ICD-10-CM | POA: Diagnosis not present

## 2023-06-20 DIAGNOSIS — M5451 Vertebrogenic low back pain: Secondary | ICD-10-CM | POA: Diagnosis not present

## 2023-06-20 DIAGNOSIS — M542 Cervicalgia: Secondary | ICD-10-CM | POA: Diagnosis not present

## 2023-06-20 DIAGNOSIS — G894 Chronic pain syndrome: Secondary | ICD-10-CM | POA: Diagnosis not present

## 2023-07-04 ENCOUNTER — Ambulatory Visit: Payer: Self-pay | Admitting: Family Medicine

## 2023-07-06 ENCOUNTER — Other Ambulatory Visit (HOSPITAL_COMMUNITY): Payer: Self-pay | Admitting: Anesthesiology

## 2023-07-06 ENCOUNTER — Ambulatory Visit (HOSPITAL_COMMUNITY)
Admission: RE | Admit: 2023-07-06 | Discharge: 2023-07-06 | Disposition: A | Discharge status: Home / Self Care | Source: Ambulatory Visit | Attending: Anesthesiology | Admitting: Anesthesiology

## 2023-07-06 ENCOUNTER — Ambulatory Visit (INDEPENDENT_AMBULATORY_CARE_PROVIDER_SITE_OTHER): Payer: Medicare (Managed Care) | Admitting: Family Medicine

## 2023-07-06 ENCOUNTER — Encounter: Payer: Self-pay | Admitting: Family Medicine

## 2023-07-06 VITALS — BP 178/68 | HR 79 | Resp 15 | Ht 72.0 in | Wt 229.0 lb

## 2023-07-06 DIAGNOSIS — G4700 Insomnia, unspecified: Secondary | ICD-10-CM | POA: Diagnosis not present

## 2023-07-06 DIAGNOSIS — R7301 Impaired fasting glucose: Secondary | ICD-10-CM

## 2023-07-06 DIAGNOSIS — E559 Vitamin D deficiency, unspecified: Secondary | ICD-10-CM

## 2023-07-06 DIAGNOSIS — E7849 Other hyperlipidemia: Secondary | ICD-10-CM | POA: Diagnosis not present

## 2023-07-06 DIAGNOSIS — E038 Other specified hypothyroidism: Secondary | ICD-10-CM

## 2023-07-06 DIAGNOSIS — I10 Essential (primary) hypertension: Secondary | ICD-10-CM

## 2023-07-06 DIAGNOSIS — M5417 Radiculopathy, lumbosacral region: Secondary | ICD-10-CM | POA: Diagnosis not present

## 2023-07-06 DIAGNOSIS — M4726 Other spondylosis with radiculopathy, lumbar region: Secondary | ICD-10-CM | POA: Diagnosis not present

## 2023-07-06 DIAGNOSIS — M545 Low back pain, unspecified: Secondary | ICD-10-CM | POA: Diagnosis not present

## 2023-07-06 DIAGNOSIS — G8929 Other chronic pain: Secondary | ICD-10-CM | POA: Diagnosis not present

## 2023-07-06 MED ORDER — TRAZODONE HCL 50 MG PO TABS
25.0000 mg | ORAL_TABLET | Freq: Every evening | ORAL | 1 refills | Status: DC | PRN
Start: 2023-07-06 — End: 2023-07-06

## 2023-07-06 MED ORDER — TRAZODONE HCL 50 MG PO TABS: 50.0000 mg | ORAL_TABLET | Freq: Every evening | ORAL | 1 refills | Status: DC | PRN

## 2023-07-06 NOTE — Progress Notes (Signed)
 Established Patient Office Visit  Subjective:  Patient ID: Stephen Fuller, male    DOB: April 03, 1955  Age: 68 y.o. MRN: [id number]  CC:  Chief Complaint  Patient presents with   Follow-up   Insomnia    Wakes up in the middle of the night and stays awake a few hrs before he can fall back asleep. Wants something to help him sleep    HPI Stephen Fuller is a 68 y.o. male with past medical history of essential hypertension, hyperlipidemia presents for f/u of  chronic medical conditions. For the details of today's visit, please refer to the assessment and plan.    Past Medical History:  Diagnosis Date   Anxiety 2022   Arthritis    Chest pain    Hepatitis C virus infection cured after antiviral drug therapy    Hypertension    Palpitations     Past Surgical History:  Procedure Laterality Date   CHOLECYSTECTOMY  2005   COLONOSCOPY  2019   LIVER BIOPSY  2019    Family History  Problem Relation Age of Onset   Diabetes Mother    Stroke Mother    Hypertension Mother    Heart disease Other        No family history   Diabetes Brother    Cirrhosis Brother    Colon cancer Neg Hx     Social History   Socioeconomic History   Marital status: Married    Spouse name: Not on file   Number of children: 2   Years of education: Not on file   Highest education level: Never attended school  Occupational History   Occupation: UNEMPLOYED  Tobacco Use   Smoking status: Every Day    Current packs/day: 0.50    Average packs/day: 0.5 packs/day for 37.0 years (18.5 ttl pk-yrs)    Types: Cigarettes   Smokeless tobacco: Never   Tobacco comments:    1/2 a pack a day  Vaping Use   Vaping status: Never Used  Substance and Sexual Activity   Alcohol use: Not Currently    Alcohol/week: 2.0 standard drinks of alcohol    Types: 2 Standard drinks or equivalent per week    Comment: Occasional   Drug use: No   Sexual activity: Not on file  Other Topics Concern   Not on file  Social  History Narrative   Not on file   Social Drivers of Health   Financial Resource Strain: Low Risk  (03/02/2023)   Overall Financial Resource Strain (CARDIA)    Difficulty of Paying Living Expenses: Not hard at all  Recent Concern: Financial Resource Strain - Medium Risk (01/08/2023)   Overall Financial Resource Strain (CARDIA)    Difficulty of Paying Living Expenses: Somewhat hard  Food Insecurity: No Food Insecurity (03/02/2023)   Hunger Vital Sign    Worried About Running Out of Food in the Last Year: Never true    Ran Out of Food in the Last Year: Never true  Recent Concern: Food Insecurity - Food Insecurity Present (01/08/2023)   Hunger Vital Sign    Worried About Running Out of Food in the Last Year: Never true    Ran Out of Food in the Last Year: Sometimes true  Transportation Needs: No Transportation Needs (03/02/2023)   PRAPARE - Administrator, Civil Service (Medical): No    Lack of Transportation (Non-Medical): No  Physical Activity: Sufficiently Active (03/02/2023)   Exercise Vital Sign    Days  of Exercise per Week: 4 days    Minutes of Exercise per Session: 60 min  Stress: Stress Concern Present (03/02/2023)   [employer] of Occupational Health - Occupational Stress Questionnaire    Feeling of Stress : To some extent  Social Connections: Socially Isolated (03/02/2023)   Social Connection and Isolation Panel [NHANES]    Frequency of Communication with Friends and Family: Once a week    Frequency of Social Gatherings with Friends and Family: Once a week    Attends Religious Services: 1 to 4 times per year    Active Member of [employer] or Organizations: No    Attends Banker Meetings: Never    Marital Status: Separated  Intimate Partner Violence: Not At Risk (01/11/2023)   Humiliation, Afraid, Rape, and Kick questionnaire    Fear of Current or Ex-Partner: No    Emotionally Abused: No    Physically Abused: No    Sexually Abused: No     Outpatient Medications Prior to Visit  Medication Sig Dispense Refill   albuterol  (VENTOLIN  HFA) 108 (90 Base) MCG/ACT inhaler Inhale 2 puffs into the lungs every 6 (six) hours as needed for wheezing or shortness of breath. 8 g 2   allopurinol  (ZYLOPRIM ) 300 MG tablet take 1 tablet once daily. 30 tablet 0   amLODipine -olmesartan  (AZOR ) 10-40 MG tablet Take 1 tablet by mouth daily. 90 tablet 1   busPIRone (BUSPAR) 5 MG tablet Take by mouth.     diclofenac  Sodium (VOLTAREN ) 1 % GEL Apply 2 g topically 4 (four) times daily. 2 g 0   gabapentin (NEURONTIN) 400 MG capsule Take by mouth.     GOODSENSE ASPIRIN  LOW DOSE 81 MG tablet take 1 tablet once daily. 30 tablet 0   hydrochlorothiazide  (HYDRODIURIL ) 12.5 MG tablet TAKE (1/2) TABLET BY MOUTH DAILY. 60 tablet 1   HYDROmorphone HCl (EXALGO) 8 MG TB24 Take 8 mg by mouth daily.     ofloxacin  (FLOXIN ) 0.3 % OTIC solution Place 5 drops into the left ear twice daily for 7 days 5 mL 1   oxyCODONE-acetaminophen  (PERCOCET) 10-325 MG tablet Take 1 tablet by mouth 4 (four) times daily as needed.     rosuvastatin  (CRESTOR ) 10 MG tablet Take 1 tablet (10 mg total) by mouth at bedtime. 90 tablet 1   tadalafil  (CIALIS ) 20 MG tablet take one-half (1/2) to 1 tablet every other day as needed for erectile dysfunction 15 tablet 0   testosterone  cypionate (DEPOTESTOSTERONE CYPIONATE) 200 MG/ML injection INJECT 0.5 MLS (100 MG TOTAL) INTO THE MUSCLE EVERY 7 (SEVEN) DAYS. 4 mL 2   Vitamin D , Ergocalciferol , (DRISDOL ) 1.25 MG (50000 UNIT) CAPS capsule Take 1 capsule (50,000 Units total) by mouth every 7 (seven) days. 20 capsule 1   XTAMPZA ER 13.5 MG C12A Take 1 capsule by mouth every 12 (twelve) hours.     azithromycin  (ZITHROMAX ) 250 MG tablet Take 2 tablets on day 1, then 1 tablet daily on days 2 through 5 6 tablet 0   promethazine -dextromethorphan (PROMETHAZINE -DM) 6.25-15 MG/5ML syrup Take 5 mLs by mouth 4 (four) times daily as needed. 118 mL 0   No  facility-administered medications prior to visit.    Allergies  Allergen Reactions   Morphine And Codeine Other (See Comments)    abd pain and inability to urinate    ROS Review of Systems  Constitutional:  Negative for fatigue and fever.  Eyes:  Negative for visual disturbance.  Respiratory:  Negative for chest tightness and shortness of  breath.   Cardiovascular:  Negative for chest pain and palpitations.  Neurological:  Negative for dizziness and headaches.      Objective:     Physical Exam HENT:     Head: Normocephalic.     Right Ear: External ear normal.     Left Ear: External ear normal.     Nose: No congestion or rhinorrhea.     Mouth/Throat:     Mouth: Mucous membranes are moist.  Cardiovascular:     Rate and Rhythm: Regular rhythm.     Heart sounds: No murmur heard. Pulmonary:     Effort: No respiratory distress.     Breath sounds: Normal breath sounds.  Neurological:     Mental Status: He is alert.     BP (!) 178/68   Pulse 79   Resp 15   Ht 6' (1.829 m)   Wt 229 lb (103.9 kg)   SpO2 94%   BMI 31.06 kg/m  Wt Readings from Last 3 Encounters:  07/06/23 229 lb (103.9 kg)  04/19/23 225 lb (102.1 kg)  03/30/23 230 lb (104.3 kg)    Lab Results  Component Value Date   TSH 1.660 03/13/2023   Lab Results  Component Value Date   WBC 9.9 03/13/2023   HGB 15.9 05/16/2023   HCT 46.1 05/16/2023   MCV 90 03/13/2023   PLT 229 03/13/2023   Lab Results  Component Value Date   NA 137 03/13/2023   K 4.2 03/13/2023   CO2 24 03/13/2023   GLUCOSE 142 (H) 03/13/2023   BUN 14 03/13/2023   CREATININE 1.18 03/13/2023   BILITOT 0.4 03/13/2023   ALKPHOS 74 03/13/2023   AST 17 03/13/2023   ALT 32 03/13/2023   PROT 6.8 03/13/2023   ALBUMIN 4.6 03/13/2023   CALCIUM  10.0 03/13/2023   ANIONGAP 7 10/20/2017   EGFR 68 03/13/2023   Lab Results  Component Value Date   CHOL 234 (H) 03/13/2023   Lab Results  Component Value Date   HDL 38 (L) 03/13/2023    Lab Results  Component Value Date   LDLCALC 118 (H) 03/13/2023   Lab Results  Component Value Date   TRIG 442 (H) 03/13/2023   Lab Results  Component Value Date   CHOLHDL 6.2 (H) 03/13/2023   Lab Results  Component Value Date   HGBA1C 6.1 (H) 03/13/2023      Assessment & Plan:  Essential hypertension Assessment & Plan: The patient's blood pressure remains uncontrolled in the clinic today; however, he is asymptomatic. He reports not taking his antihypertensive medication this morning. His current regimen includes Amlodipine -Olmesartan  10/40 mg and Hydrochlorothiazide  6.25 mg daily.  No changes will be made to his treatment regimen at this time. He is encouraged to resume taking his medication as prescribed and to follow up if his blood pressure remains consistently above 140/90.  Lifestyle recommendations include: Following a low-sodium diet (<2,300 mg/day) Engaging in moderate-intensity physical activity totaling at least 150 minutes per week The patient is encouraged to continue these lifestyle modifications to support blood pressure control.      Insomnia, unspecified type Assessment & Plan: Will treat with Trazodone 1-2 tablets as needed for sleep. Nonpharmacologic interventions discussed, including establishing a consistent bedtime routine, maintaining a regular sleep-wake schedule, avoiding electronic devices before bed, limiting time spent in bed to sleep only, and managing daily stress.  Orders: -     traZODone HCl; Take 1-2 tablets (50-100 mg total) by mouth at bedtime as needed for  sleep.  Dispense: 30 tablet; Refill: 1  IFG (impaired fasting glucose) -     Hemoglobin A1c  Vitamin D  deficiency -     VITAMIN D  25 Hydroxy (Vit-D Deficiency, Fractures)  TSH (thyroid -stimulating hormone deficiency) -     TSH + free T4  Other hyperlipidemia -     Lipid panel -     CMP14+EGFR -     CBC with Differential/Platelet  Note: This chart has been completed using  Engineer, civil (consulting) software, and while attempts have been made to ensure accuracy, certain words and phrases may not be transcribed as intended.    Follow-up: Return in about 4 months (around 11/06/2023).   Chaunta Bejarano, FNP

## 2023-07-06 NOTE — Patient Instructions (Addendum)
 I appreciate the opportunity to provide care to you today!    Follow up:  3 months  Labs: please stop by the lab today/ during the week to get your blood drawn (CBC, CMP, TSH, Lipid profile, HgA1c, Vit D)  Insomnia: Start taking Trazodone 50 mg, 1 to 2 tablets at bedtime as needed to help induce and maintain sleep. Monitor for side effects such as next-day drowsiness, dizziness, or dry mouth. Use caution when standing from a lying position due to the risk of orthostatic hypotension.  Follow up if symptoms persist or worsen. Insomnia For managing insomnia non-pharmacologically, consider the following sleep hygiene practices: Establish a Bedtime Routine: Create a consistent routine before bed to signal to your body that it is time to wind down. Maintain a Regular Sleep-Wake Schedule: Go to bed and wake up at the same time every day, even on weekends. Avoid Electronics Before Bed: Limit the use of computers and other electronic devices at least one hour before bedtime to reduce blue light exposure. Limit Time in Bed: If unable to fall asleep within 15 minutes, get out of bed and engage in a relaxing activity before trying again. Manage Daily Stress: Incorporate stress-reducing activities into your daily routine to alleviate anxiety and tension. Avoid Late Exercise: Refrain from vigorous exercise close to bedtime, as it can interfere with sleep. Use Bed for Sleep and Intimacy Only: Reserve the bed for sleep and sexual activity to strengthen the association between your bed and rest. Remove Electronics from [employer] Area: Consider keeping electronic devices away from the bedroom to minimize distractions.    Please continue to a heart-healthy diet and increase your physical activities. Try to exercise for at least five days a week.    It was a pleasure to see you and I look forward to continuing to work together on your health and well-being. Please do not hesitate to call the office  if you need care or have questions about your care.  In case of emergency, please visit the Emergency Department for urgent care, or contact our clinic at [phone] to schedule an appointment. We're here to help you!   Have a wonderful day and week. With Gratitude, Duchess Armendarez MSN, FNP-BC

## 2023-07-07 ENCOUNTER — Telehealth: Payer: Self-pay

## 2023-07-07 DIAGNOSIS — G4700 Insomnia, unspecified: Secondary | ICD-10-CM | POA: Insufficient documentation

## 2023-07-07 NOTE — Assessment & Plan Note (Signed)
 The patient's blood pressure remains uncontrolled in the clinic today; however, he is asymptomatic. He reports not taking his antihypertensive medication this morning. His current regimen includes Amlodipine -Olmesartan  10/40 mg and Hydrochlorothiazide  6.25 mg daily.  No changes will be made to his treatment regimen at this time. He is encouraged to resume taking his medication as prescribed and to follow up if his blood pressure remains consistently above 140/90.  Lifestyle recommendations include: Following a low-sodium diet (<2,300 mg/day) Engaging in moderate-intensity physical activity totaling at least 150 minutes per week The patient is encouraged to continue these lifestyle modifications to support blood pressure control.

## 2023-07-07 NOTE — Telephone Encounter (Signed)
 Will pick up on 05/27

## 2023-07-07 NOTE — Assessment & Plan Note (Signed)
 Will treat with Trazodone 1-2 tablets as needed for sleep. Nonpharmacologic interventions discussed, including establishing a consistent bedtime routine, maintaining a regular sleep-wake schedule, avoiding electronic devices before bed, limiting time spent in bed to sleep only, and managing daily stress.

## 2023-07-11 DIAGNOSIS — R7301 Impaired fasting glucose: Secondary | ICD-10-CM | POA: Diagnosis not present

## 2023-07-11 DIAGNOSIS — E038 Other specified hypothyroidism: Secondary | ICD-10-CM | POA: Diagnosis not present

## 2023-07-11 DIAGNOSIS — E7849 Other hyperlipidemia: Secondary | ICD-10-CM | POA: Diagnosis not present

## 2023-07-11 DIAGNOSIS — E559 Vitamin D deficiency, unspecified: Secondary | ICD-10-CM | POA: Diagnosis not present

## 2023-07-12 LAB — LIPID PANEL
Chol/HDL Ratio: 2.9 ratio (ref 0.0–5.0)
Cholesterol, Total: 106 mg/dL (ref 100–199)
HDL: 36 mg/dL — ABNORMAL LOW (ref >39)
LDL Chol Calc (NIH): 42 mg/dL (ref 0–99)
Triglycerides: 167 mg/dL — ABNORMAL HIGH (ref 0–149)
VLDL Cholesterol Cal: 28 mg/dL (ref 5–40)

## 2023-07-12 LAB — TSH+FREE T4
Free T4: 1.11 ng/dL (ref 0.82–1.77)
TSH: 1.72 u[IU]/mL (ref 0.450–4.500)

## 2023-07-12 LAB — CMP14+EGFR
ALT: 28 IU/L (ref 0–44)
AST: 22 IU/L (ref 0–40)
Albumin: 4.8 g/dL (ref 3.9–4.9)
Alkaline Phosphatase: 69 IU/L (ref 44–121)
BUN/Creatinine Ratio: 9 — ABNORMAL LOW (ref 10–24)
BUN: 10 mg/dL (ref 8–27)
Bilirubin Total: 0.3 mg/dL (ref 0.0–1.2)
CO2: 22 mmol/L (ref 20–29)
Calcium: 10.1 mg/dL (ref 8.6–10.2)
Chloride: 102 mmol/L (ref 96–106)
Creatinine, Ser: 1.09 mg/dL (ref 0.76–1.27)
Globulin, Total: 2.2 g/dL (ref 1.5–4.5)
Glucose: 112 mg/dL — ABNORMAL HIGH (ref 70–99)
Potassium: 4.4 mmol/L (ref 3.5–5.2)
Sodium: 140 mmol/L (ref 134–144)
Total Protein: 7 g/dL (ref 6.0–8.5)
eGFR: 74 mL/min/{1.73_m2} (ref >59)

## 2023-07-12 LAB — CBC WITH DIFFERENTIAL/PLATELET
Basophils Absolute: 0.1 10*3/uL (ref 0.0–0.2)
Basos: 1 %
EOS (ABSOLUTE): 0.2 10*3/uL (ref 0.0–0.4)
Eos: 2 %
Hematocrit: 46 % (ref 37.5–51.0)
Hemoglobin: 15.6 g/dL (ref 13.0–17.7)
Immature Grans (Abs): 0 10*3/uL (ref 0.0–0.1)
Immature Granulocytes: 0 %
Lymphocytes Absolute: 4.1 10*3/uL — ABNORMAL HIGH (ref 0.7–3.1)
Lymphs: 47 %
MCH: 31.4 pg (ref 26.6–33.0)
MCHC: 33.9 g/dL (ref 31.5–35.7)
MCV: 93 fL (ref 79–97)
Monocytes Absolute: 0.6 10*3/uL (ref 0.1–0.9)
Monocytes: 7 %
Neutrophils Absolute: 3.7 10*3/uL (ref 1.4–7.0)
Neutrophils: 43 %
Platelets: 237 10*3/uL (ref 150–450)
RBC: 4.97 x10E6/uL (ref 4.14–5.80)
RDW: 13.7 % (ref 11.6–15.4)
WBC: 8.7 10*3/uL (ref 3.4–10.8)

## 2023-07-12 LAB — HEMOGLOBIN A1C
Est. average glucose Bld gHb Est-mCnc: 123 mg/dL
Hgb A1c MFr Bld: 5.9 % — ABNORMAL HIGH (ref 4.8–5.6)

## 2023-07-12 LAB — VITAMIN D 25 HYDROXY (VIT D DEFICIENCY, FRACTURES): Vit D, 25-Hydroxy: 52.3 ng/mL (ref 30.0–100.0)

## 2023-07-14 ENCOUNTER — Other Ambulatory Visit: Payer: Self-pay | Admitting: Family Medicine

## 2023-07-14 DIAGNOSIS — M10071 Idiopathic gout, right ankle and foot: Secondary | ICD-10-CM

## 2023-08-05 ENCOUNTER — Other Ambulatory Visit: Payer: Self-pay | Admitting: Urology

## 2023-08-07 ENCOUNTER — Ambulatory Visit: Payer: Self-pay

## 2023-08-07 NOTE — Telephone Encounter (Signed)
 FYI Only or Action Required?: Action required by provider: clinical question for provider.  Patient was last seen in primary care on 07/06/2023 by Zarwolo, Gloria, FNP. Called Nurse Triage reporting Foot Swelling. Symptoms began several weeks ago. Interventions attempted: Nothing. Symptoms are: unchanged. Trazodone  causing swelling to both feet and ankles. Has stopped the medication. Did not help him sleep, asking for another medication. Triage Disposition: See PCP When Office is Open (Within 3 Days)  Patient/caregiver understands and will follow disposition?: No, wishes to speak with PCPCopied from CRM [phone]. Topic: Clinical - Red Word Triage >> Aug 07, 2023  2:31 PM Silvana PARAS wrote: Red Word that prompted transfer to Nurse Triage: Pt calling to state that traZODone  (DESYREL ) 50 MG tablet is causing both feet and ankle swelling and hangover like side effects. Pt looking for an alternative. Callback number is [phone]. Reason for Disposition  [1] MILD swelling of both ankles (i.e., pedal edema) AND [2] new-onset or worsening  Answer Assessment - Initial Assessment Questions 1. ONSET: When did the swelling start? (e.g., minutes, hours, days)     2-3 weeks 2. LOCATION: What part of the leg is swollen?  Are both legs swollen or just one leg?     Both feet, ankle 3. SEVERITY: How bad is the swelling? (e.g., localized; mild, moderate, severe)   - Localized: Small area of swelling localized to one leg.   - MILD pedal edema: Swelling limited to foot and ankle, pitting edema < 1/4 inch (6 mm) deep, rest and elevation eliminate most or all swelling.   - MODERATE edema: Swelling of lower leg to knee, pitting edema > 1/4 inch (6 mm) deep, rest and elevation only partially reduce swelling.   - SEVERE edema: Swelling extends above knee, facial or hand swelling present.      mild 4. REDNESS: Does the swelling look red or infected?     no 5. PAIN: Is the swelling painful to touch? If Yes,  ask: How painful is it?   (Scale 1-10; mild, moderate or severe)     no 6. FEVER: Do you have a fever? If Yes, ask: What is it, how was it measured, and when did it start?      no 7. CAUSE: What do you think is causing the leg swelling?     Trazodone   8. MEDICAL HISTORY: Do you have a history of blood clots (e.g., DVT), cancer, heart failure, kidney disease, or liver failure?     no 9. RECURRENT SYMPTOM: Have you had leg swelling before? If Yes, ask: When was the last time? What happened that time?     no 10. OTHER SYMPTOMS: Do you have any other symptoms? (e.g., chest pain, difficulty breathing)       no 11. PREGNANCY: Is there any chance you are pregnant? When was your last menstrual period?       n/a  Protocols used: Leg Swelling and Edema-A-AH

## 2023-08-07 NOTE — Telephone Encounter (Signed)
 Patient states he had to stop the trazodone  due to swelling and it wasn't helping him sleep either. Wants it changed to something else. Pls advise

## 2023-08-08 ENCOUNTER — Ambulatory Visit: Payer: Self-pay | Admitting: Family Medicine

## 2023-08-09 ENCOUNTER — Other Ambulatory Visit: Payer: Self-pay | Admitting: Family Medicine

## 2023-08-09 DIAGNOSIS — M10071 Idiopathic gout, right ankle and foot: Secondary | ICD-10-CM

## 2023-08-11 DIAGNOSIS — M545 Low back pain, unspecified: Secondary | ICD-10-CM | POA: Diagnosis not present

## 2023-08-14 ENCOUNTER — Other Ambulatory Visit: Payer: Self-pay | Admitting: Family Medicine

## 2023-08-14 DIAGNOSIS — G479 Sleep disorder, unspecified: Secondary | ICD-10-CM

## 2023-08-14 MED ORDER — HYDROXYZINE PAMOATE 25 MG PO CAPS: 25.0000 mg | ORAL_CAPSULE | Freq: Every evening | ORAL | 1 refills | Status: DC | PRN

## 2023-08-15 DIAGNOSIS — M5451 Vertebrogenic low back pain: Secondary | ICD-10-CM | POA: Diagnosis not present

## 2023-08-15 DIAGNOSIS — G894 Chronic pain syndrome: Secondary | ICD-10-CM | POA: Diagnosis not present

## 2023-08-15 DIAGNOSIS — M542 Cervicalgia: Secondary | ICD-10-CM | POA: Diagnosis not present

## 2023-08-15 DIAGNOSIS — F411 Generalized anxiety disorder: Secondary | ICD-10-CM | POA: Diagnosis not present

## 2023-08-15 DIAGNOSIS — Z79891 Long term (current) use of opiate analgesic: Secondary | ICD-10-CM | POA: Diagnosis not present

## 2023-08-15 DIAGNOSIS — M5417 Radiculopathy, lumbosacral region: Secondary | ICD-10-CM | POA: Diagnosis not present

## 2023-09-09 ENCOUNTER — Other Ambulatory Visit: Payer: Self-pay | Admitting: Family Medicine

## 2023-09-09 DIAGNOSIS — M10071 Idiopathic gout, right ankle and foot: Secondary | ICD-10-CM

## 2023-09-09 DIAGNOSIS — I10 Essential (primary) hypertension: Secondary | ICD-10-CM

## 2023-10-09 ENCOUNTER — Encounter: Payer: Self-pay | Admitting: Family Medicine

## 2023-10-09 ENCOUNTER — Ambulatory Visit (INDEPENDENT_AMBULATORY_CARE_PROVIDER_SITE_OTHER): Admitting: Family Medicine

## 2023-10-09 VITALS — BP 168/74 | HR 89 | Resp 16 | Ht 72.0 in | Wt 217.0 lb

## 2023-10-09 DIAGNOSIS — N528 Other male erectile dysfunction: Secondary | ICD-10-CM

## 2023-10-09 DIAGNOSIS — R7301 Impaired fasting glucose: Secondary | ICD-10-CM | POA: Diagnosis not present

## 2023-10-09 DIAGNOSIS — I10 Essential (primary) hypertension: Secondary | ICD-10-CM | POA: Diagnosis not present

## 2023-10-09 DIAGNOSIS — R399 Unspecified symptoms and signs involving the genitourinary system: Secondary | ICD-10-CM | POA: Diagnosis not present

## 2023-10-09 DIAGNOSIS — G479 Sleep disorder, unspecified: Secondary | ICD-10-CM

## 2023-10-09 DIAGNOSIS — E559 Vitamin D deficiency, unspecified: Secondary | ICD-10-CM

## 2023-10-09 DIAGNOSIS — H9212 Otorrhea, left ear: Secondary | ICD-10-CM

## 2023-10-09 DIAGNOSIS — R351 Nocturia: Secondary | ICD-10-CM | POA: Diagnosis not present

## 2023-10-09 DIAGNOSIS — E038 Other specified hypothyroidism: Secondary | ICD-10-CM | POA: Diagnosis not present

## 2023-10-09 DIAGNOSIS — E7849 Other hyperlipidemia: Secondary | ICD-10-CM | POA: Diagnosis not present

## 2023-10-09 MED ORDER — OFLOXACIN 0.3 % OT SOLN: OTIC | 1 refills | Status: AC

## 2023-10-09 MED ORDER — TADALAFIL 20 MG PO TABS: ORAL_TABLET | ORAL | 0 refills | Status: AC

## 2023-10-09 MED ORDER — HYDROXYZINE PAMOATE 25 MG PO CAPS: 25.0000 mg | ORAL_CAPSULE | Freq: Every evening | ORAL | 1 refills | Status: DC | PRN

## 2023-10-09 MED ORDER — TAMSULOSIN HCL 0.4 MG PO CAPS: 0.4000 mg | ORAL_CAPSULE | Freq: Every day | ORAL | 3 refills | Status: DC

## 2023-10-09 NOTE — Assessment & Plan Note (Signed)
 Uncontrolled blood pressure noted in clinic today. Patient is asymptomatic. Encouraged the patient to continue current treatment regimen as prescribed. Follow-up in 4 weeks for a nurse visit to reassess blood pressure. Instructed patient to seek immediate care if symptoms such as chest pain, shortness of breath, severe headache, vision changes, or neurological deficits occur.  Long-term considerations were discussed, emphasizing that uncontrolled hypertension increases the risk of cardiovascular diseases, including stroke, coronary artery disease, and heart failure.  The patient is encouraged to seek emergency care if blood pressure exceeds 180/120 and is accompanied by symptoms such as headaches, chest pain, palpitations, blurred vision, or dizziness. She verbalized understanding and will follow up as scheduled.

## 2023-10-09 NOTE — Progress Notes (Signed)
 Established Patient Office Visit  Subjective:  Patient ID: Stephen Fuller, male    DOB: [birth year]  Age: 68 y.o. MRN: [id number]  CC:  Chief Complaint  Patient presents with   Hypertension    3 month follow up    Nocturia    Has been having issues having to get up to urinate at night for about 5 months. Has sleep apnea and is unable to wear the mask so he stopped using it. His friend is on tamulosin and it helps him and he wants to try it    Medication Refill    Wants the ear drops refilled because he gets infections frequently        HPI METRO EDENFIELD is a 68 y.o. male with past medical history of essential hypertension, hyperlipidemia, and  sleep disturbance presents for f/u of  chronic medical conditions with the above complains with no burning with urination or pain reported.    Past Medical History:  Diagnosis Date   Anxiety 2022   Arthritis    Chest pain    Hepatitis C virus infection cured after antiviral drug therapy    Hypertension    Palpitations     Past Surgical History:  Procedure Laterality Date   CHOLECYSTECTOMY  2005   COLONOSCOPY  2019   LIVER BIOPSY  2019    Family History  Problem Relation Age of Onset   Diabetes Mother    Stroke Mother    Hypertension Mother    Heart disease Other        No family history   Diabetes Brother    Cirrhosis Brother    Colon cancer Neg Hx     Social History   Socioeconomic History   Marital status: Married    Spouse name: Not on file   Number of children: 2   Years of education: Not on file   Highest education level: Never attended school  Occupational History   Occupation: UNEMPLOYED  Tobacco Use   Smoking status: Every Day    Current packs/day: 0.50    Average packs/day: 0.5 packs/day for 37.0 years (18.5 ttl pk-yrs)    Types: Cigarettes   Smokeless tobacco: Never   Tobacco comments:    1/2 a pack a day  Vaping Use   Vaping status: Never Used  Substance and Sexual Activity   Alcohol use:  Not Currently    Alcohol/week: 2.0 standard drinks of alcohol    Types: 2 Standard drinks or equivalent per week    Comment: Occasional   Drug use: No   Sexual activity: Not on file  Other Topics Concern   Not on file  Social History Narrative   Not on file   Social Drivers of Health   Financial Resource Strain: Low Risk  (03/02/2023)   Overall Financial Resource Strain (CARDIA)    Difficulty of Paying Living Expenses: Not hard at all  Recent Concern: Financial Resource Strain - Medium Risk (01/08/2023)   Overall Financial Resource Strain (CARDIA)    Difficulty of Paying Living Expenses: Somewhat hard  Food Insecurity: No Food Insecurity (03/02/2023)   Hunger Vital Sign    Worried About Running Out of Food in the Last Year: Never true    Ran Out of Food in the Last Year: Never true  Recent Concern: Food Insecurity - Food Insecurity Present (01/08/2023)   Hunger Vital Sign    Worried About Running Out of Food in the Last Year: Never true  Ran Out of Food in the Last Year: Sometimes true  Transportation Needs: No Transportation Needs (03/02/2023)   PRAPARE - Administrator, Civil Service (Medical): No    Lack of Transportation (Non-Medical): No  Physical Activity: Sufficiently Active (03/02/2023)   Exercise Vital Sign    Days of Exercise per Week: 4 days    Minutes of Exercise per Session: 60 min  Stress: Stress Concern Present (03/02/2023)   [employer] of Occupational Health - Occupational Stress Questionnaire    Feeling of Stress : To some extent  Social Connections: Socially Isolated (03/02/2023)   Social Connection and Isolation Panel    Frequency of Communication with Friends and Family: Once a week    Frequency of Social Gatherings with Friends and Family: Once a week    Attends Religious Services: 1 to 4 times per year    Active Member of [employer] or Organizations: No    Attends Banker Meetings: Never    Marital Status: Separated  Intimate  Partner Violence: Not At Risk (01/11/2023)   Humiliation, Afraid, Rape, and Kick questionnaire    Fear of Current or Ex-Partner: No    Emotionally Abused: No    Physically Abused: No    Sexually Abused: No    Outpatient Medications Prior to Visit  Medication Sig Dispense Refill   albuterol  (VENTOLIN  HFA) 108 (90 Base) MCG/ACT inhaler Inhale 2 puffs into the lungs every 6 (six) hours as needed for wheezing or shortness of breath. 8 g 2   allopurinol  (ZYLOPRIM ) 300 MG tablet take 1 tablet once daily. 30 tablet 0   amLODipine -olmesartan  (AZOR ) 10-40 MG tablet take 1 tablet by mouth daily. 90 tablet 0   busPIRone (BUSPAR) 5 MG tablet Take by mouth.     diclofenac  Sodium (VOLTAREN ) 1 % GEL Apply 2 g topically 4 (four) times daily. 2 g 0   gabapentin (NEURONTIN) 400 MG capsule Take by mouth.     GOODSENSE ASPIRIN  LOW DOSE 81 MG tablet take 1 tablet once daily. 30 tablet 0   hydrochlorothiazide  (HYDRODIURIL ) 12.5 MG tablet TAKE (1/2) TABLET BY MOUTH DAILY. 60 tablet 1   HYDROmorphone HCl (EXALGO) 8 MG TB24 Take 8 mg by mouth daily.     oxyCODONE-acetaminophen  (PERCOCET) 10-325 MG tablet Take 1 tablet by mouth 4 (four) times daily as needed.     rosuvastatin  (CRESTOR ) 10 MG tablet take 1 tablet (10 MILLIGRAM total) by mouth at bedtime. 90 tablet 0   testosterone  cypionate (DEPOTESTOSTERONE CYPIONATE) 200 MG/ML injection INJECT 0.5 MLS (100 MG TOTAL) INTO THE MUSCLE EVERY 7 (SEVEN) DAYS. 4 mL 2   Vitamin D , Ergocalciferol , (DRISDOL ) 1.25 MG (50000 UNIT) CAPS capsule Take 1 capsule (50,000 Units total) by mouth every 7 (seven) days. 20 capsule 1   XTAMPZA ER 13.5 MG C12A Take 1 capsule by mouth every 12 (twelve) hours.     hydrOXYzine  (VISTARIL ) 25 MG capsule Take 1 capsule (25 mg total) by mouth at bedtime as needed. 30 capsule 1   ofloxacin  (FLOXIN ) 0.3 % OTIC solution Place 5 drops into the left ear twice daily for 7 days 5 mL 1   tadalafil  (CIALIS ) 20 MG tablet take one-half (1/2) to 1 tablet  every other day as needed for erectile dysfunction 15 tablet 0   traZODone  (DESYREL ) 50 MG tablet Take 1-2 tablets (50-100 mg total) by mouth at bedtime as needed for sleep. (Patient not taking: Reported on 10/09/2023) 30 tablet 1   No facility-administered medications prior  to visit.    Allergies  Allergen Reactions   Morphine And Codeine Other (See Comments)    abd pain and inability to urinate    ROS Review of Systems  Constitutional:  Negative for fatigue and fever.  Eyes:  Negative for visual disturbance.  Respiratory:  Negative for chest tightness and shortness of breath.   Cardiovascular:  Negative for chest pain and palpitations.  Genitourinary:  Positive for frequency (at nighttime).  Neurological:  Negative for dizziness and headaches.      Objective:    Physical Exam HENT:     Head: Normocephalic.     Right Ear: External ear normal.     Left Ear: External ear normal.     Nose: No congestion or rhinorrhea.     Mouth/Throat:     Mouth: Mucous membranes are moist.  Cardiovascular:     Rate and Rhythm: Regular rhythm.     Heart sounds: No murmur heard. Pulmonary:     Effort: No respiratory distress.     Breath sounds: Normal breath sounds.  Neurological:     Mental Status: He is alert.     BP (!) 168/74   Pulse 89   Resp 16   Ht 6' (1.829 m)   Wt 217 lb (98.4 kg)   SpO2 96%   BMI 29.43 kg/m  Wt Readings from Last 3 Encounters:  10/09/23 217 lb (98.4 kg)  07/06/23 229 lb (103.9 kg)  04/19/23 225 lb (102.1 kg)    Lab Results  Component Value Date   TSH 1.720 07/11/2023   Lab Results  Component Value Date   WBC 8.7 07/11/2023   HGB 15.6 07/11/2023   HCT 46.0 07/11/2023   MCV 93 07/11/2023   PLT 237 07/11/2023   Lab Results  Component Value Date   NA 140 07/11/2023   K 4.4 07/11/2023   CO2 22 07/11/2023   GLUCOSE 112 (H) 07/11/2023   BUN 10 07/11/2023   CREATININE 1.09 07/11/2023   BILITOT 0.3 07/11/2023   ALKPHOS 69 07/11/2023   AST  22 07/11/2023   ALT 28 07/11/2023   PROT 7.0 07/11/2023   ALBUMIN 4.8 07/11/2023   CALCIUM  10.1 07/11/2023   ANIONGAP 7 10/20/2017   EGFR 74 07/11/2023   Lab Results  Component Value Date   CHOL 106 07/11/2023   Lab Results  Component Value Date   HDL 36 (L) 07/11/2023   Lab Results  Component Value Date   LDLCALC 42 07/11/2023   Lab Results  Component Value Date   TRIG 167 (H) 07/11/2023   Lab Results  Component Value Date   CHOLHDL 2.9 07/11/2023   Lab Results  Component Value Date   HGBA1C 5.9 (H) 07/11/2023      Assessment & Plan:  Lower urinary tract symptoms (LUTS) Assessment & Plan: UA is negative for UTI Start Flomax  (tamsulosin ) 0.4 mg daily. -Recommend limiting evening fluid intake. -Reduce caffeine and alcohol consumption. -Encourage bladder training strategies.  Orders: -     Tamsulosin  HCl; Take 1 capsule (0.4 mg total) by mouth daily.  Dispense: 30 capsule; Refill: 3 -     Urinalysis  Essential hypertension Assessment & Plan: Uncontrolled blood pressure noted in clinic today. Patient is asymptomatic. Encouraged the patient to continue current treatment regimen as prescribed. Follow-up in 4 weeks for a nurse visit to reassess blood pressure. Instructed patient to seek immediate care if symptoms such as chest pain, shortness of breath, severe headache, vision changes, or neurological deficits  occur.  Long-term considerations were discussed, emphasizing that uncontrolled hypertension increases the risk of cardiovascular diseases, including stroke, coronary artery disease, and heart failure.  The patient is encouraged to seek emergency care if blood pressure exceeds 180/120 and is accompanied by symptoms such as headaches, chest pain, palpitations, blurred vision, or dizziness. She verbalized understanding and will follow up as scheduled.    Other hyperlipidemia Assessment & Plan: Continue taking rosuvastatin  10 mg daily Encouraged a heart  healthy diet with increased physical activity Lab Results  Component Value Date   CHOL 106 07/11/2023   HDL 36 (L) 07/11/2023   LDLCALC 42 07/11/2023   TRIG 167 (H) 07/11/2023   CHOLHDL 2.9 07/11/2023      Orders: -     Lipid panel -     CMP14+EGFR -     CBC with Differential/Platelet  Drainage from left ear -     Ofloxacin ; Place 5 drops into the left ear twice daily for 7 days  Dispense: 5 mL; Refill: 1  Sleep disturbance -     hydrOXYzine  Pamoate; Take 1 capsule (25 mg total) by mouth at bedtime as needed.  Dispense: 30 capsule; Refill: 1  Other male erectile dysfunction -     Tadalafil ; take ONE-HALF (1/2) to 1 tablet every other day as needed for erectile dysfunction  Dispense: 15 tablet; Refill: 0  IFG (impaired fasting glucose) -     Hemoglobin A1c  Vitamin D  deficiency -     VITAMIN D  25 Hydroxy (Vit-D Deficiency, Fractures)  TSH (thyroid -stimulating hormone deficiency) -     TSH + free T4  Note: This chart has been completed using Engineer, civil (consulting) software, and while attempts have been made to ensure accuracy, certain words and phrases may not be transcribed as intended.    Follow-up: Return in about 4 months (around 02/08/2024).   Maytte Jacot, FNP

## 2023-10-09 NOTE — Assessment & Plan Note (Signed)
 Continue taking rosuvastatin  10 mg daily Encouraged a heart healthy diet with increased physical activity Lab Results  Component Value Date   CHOL 106 07/11/2023   HDL 36 (L) 07/11/2023   LDLCALC 42 07/11/2023   TRIG 167 (H) 07/11/2023   CHOLHDL 2.9 07/11/2023

## 2023-10-09 NOTE — Patient Instructions (Addendum)
 I appreciate the opportunity to provide care to you today!    Follow up:  4 months  Labs: please stop by the lab today to get your blood drawn (CBC, CMP, TSH, Lipid profile, HgA1c, Vit D)  Lower Urinary Tract Symptoms (LUTS): -Start Flomax  (tamsulosin ) 0.4 mg daily. -Recommend limiting evening fluid intake. -Reduce caffeine and alcohol consumption. -Encourage bladder training strategies.  Please follow up if your symptoms worsen or fail to improve.   Attached with your AVS, you will find valuable resources for self-education. I highly recommend dedicating some time to thoroughly examine them.   Please continue to a heart-healthy diet and increase your physical activities. Try to exercise for at least five days a week.    It was a pleasure to see you and I look forward to continuing to work together on your health and well-being. Please do not hesitate to call the office if you need care or have questions about your care.  In case of emergency, please visit the Emergency Department for urgent care, or contact our clinic at [phone] to schedule an appointment. We're here to help you!   Have a wonderful day and week. With Gratitude, Tobey Lippard MSN, FNP-BC

## 2023-10-09 NOTE — Assessment & Plan Note (Addendum)
 UA is negative for UTI Start Flomax  (tamsulosin ) 0.4 mg daily. -Recommend limiting evening fluid intake. -Reduce caffeine and alcohol consumption. -Encourage bladder training strategies.

## 2023-10-10 LAB — CMP14+EGFR
ALT: 30 IU/L (ref 0–44)
AST: 23 IU/L (ref 0–40)
Albumin: 4.8 g/dL (ref 3.9–4.9)
Alkaline Phosphatase: 79 IU/L (ref 44–121)
BUN/Creatinine Ratio: 21 (ref 10–24)
BUN: 23 mg/dL (ref 8–27)
Bilirubin Total: 0.3 mg/dL (ref 0.0–1.2)
CO2: 20 mmol/L (ref 20–29)
Calcium: 9.9 mg/dL (ref 8.6–10.2)
Chloride: 102 mmol/L (ref 96–106)
Creatinine, Ser: 1.12 mg/dL (ref 0.76–1.27)
Globulin, Total: 2.3 g/dL (ref 1.5–4.5)
Glucose: 90 mg/dL (ref 70–99)
Potassium: 4.1 mmol/L (ref 3.5–5.2)
Sodium: 140 mmol/L (ref 134–144)
Total Protein: 7.1 g/dL (ref 6.0–8.5)
eGFR: 72 mL/min/1.73 (ref >59)

## 2023-10-10 LAB — CBC WITH DIFFERENTIAL/PLATELET
Basophils Absolute: 0.1 x10E3/uL (ref 0.0–0.2)
Basos: 1 %
EOS (ABSOLUTE): 0.2 x10E3/uL (ref 0.0–0.4)
Eos: 2 %
Hematocrit: 45.8 % (ref 37.5–51.0)
Hemoglobin: 15.3 g/dL (ref 13.0–17.7)
Immature Grans (Abs): 0 x10E3/uL (ref 0.0–0.1)
Immature Granulocytes: 0 %
Lymphocytes Absolute: 5.1 x10E3/uL — ABNORMAL HIGH (ref 0.7–3.1)
Lymphs: 43 %
MCH: 31 pg (ref 26.6–33.0)
MCHC: 33.4 g/dL (ref 31.5–35.7)
MCV: 93 fL (ref 79–97)
Monocytes Absolute: 0.9 x10E3/uL (ref 0.1–0.9)
Monocytes: 7 %
Neutrophils Absolute: 5.8 x10E3/uL (ref 1.4–7.0)
Neutrophils: 47 %
Platelets: 294 x10E3/uL (ref 150–450)
RBC: 4.93 x10E6/uL (ref 4.14–5.80)
RDW: 13.7 % (ref 11.6–15.4)
WBC: 12 x10E3/uL — ABNORMAL HIGH (ref 3.4–10.8)

## 2023-10-10 LAB — LIPID PANEL
Chol/HDL Ratio: 3.1 ratio (ref 0.0–5.0)
Cholesterol, Total: 102 mg/dL (ref 100–199)
HDL: 33 mg/dL — ABNORMAL LOW (ref >39)
LDL Chol Calc (NIH): 30 mg/dL (ref 0–99)
Triglycerides: 259 mg/dL — ABNORMAL HIGH (ref 0–149)
VLDL Cholesterol Cal: 39 mg/dL (ref 5–40)

## 2023-10-10 LAB — TSH+FREE T4
Free T4: 1.33 ng/dL (ref 0.82–1.77)
TSH: 1.52 u[IU]/mL (ref 0.450–4.500)

## 2023-10-10 LAB — VITAMIN D 25 HYDROXY (VIT D DEFICIENCY, FRACTURES): Vit D, 25-Hydroxy: 58.4 ng/mL (ref 30.0–100.0)

## 2023-10-10 LAB — HEMOGLOBIN A1C
Est. average glucose Bld gHb Est-mCnc: 126 mg/dL
Hgb A1c MFr Bld: 6 % — ABNORMAL HIGH (ref 4.8–5.6)

## 2023-10-12 ENCOUNTER — Other Ambulatory Visit: Payer: Self-pay | Admitting: Family Medicine

## 2023-10-12 DIAGNOSIS — M10071 Idiopathic gout, right ankle and foot: Secondary | ICD-10-CM

## 2023-10-12 LAB — URINALYSIS
Bilirubin, UA: NEGATIVE
Glucose, UA: NEGATIVE
Ketones, UA: NEGATIVE
Leukocytes,UA: NEGATIVE
Nitrite, UA: NEGATIVE
Protein,UA: NEGATIVE
RBC, UA: NEGATIVE
Specific Gravity, UA: 1.01 (ref 1.005–1.030)
Urobilinogen, Ur: 0.2 mg/dL (ref 0.2–1.0)
pH, UA: 5.5 (ref 5.0–7.5)

## 2023-10-13 ENCOUNTER — Other Ambulatory Visit: Payer: Self-pay | Admitting: Family Medicine

## 2023-10-14 ENCOUNTER — Ambulatory Visit: Payer: Self-pay | Admitting: Family Medicine

## 2023-10-24 DIAGNOSIS — Z79891 Long term (current) use of opiate analgesic: Secondary | ICD-10-CM | POA: Diagnosis not present

## 2023-10-24 DIAGNOSIS — F411 Generalized anxiety disorder: Secondary | ICD-10-CM | POA: Diagnosis not present

## 2023-10-24 DIAGNOSIS — M5417 Radiculopathy, lumbosacral region: Secondary | ICD-10-CM | POA: Diagnosis not present

## 2023-10-24 DIAGNOSIS — M5451 Vertebrogenic low back pain: Secondary | ICD-10-CM | POA: Diagnosis not present

## 2023-10-24 DIAGNOSIS — M542 Cervicalgia: Secondary | ICD-10-CM | POA: Diagnosis not present

## 2023-10-24 DIAGNOSIS — G894 Chronic pain syndrome: Secondary | ICD-10-CM | POA: Diagnosis not present

## 2023-10-24 DIAGNOSIS — M5010 Cervical disc disorder with radiculopathy, unspecified cervical region: Secondary | ICD-10-CM | POA: Diagnosis not present

## 2023-10-30 ENCOUNTER — Other Ambulatory Visit: Payer: Self-pay | Admitting: Urology

## 2023-10-30 NOTE — Telephone Encounter (Signed)
 Please refill if appropriate pt appt 10/10

## 2023-10-30 NOTE — Telephone Encounter (Signed)
 Patient needing refill of  Testosterone  200 mg  Pharmacy:   [id number] - [place], [place] - [address] AT [address] Phone: [phone]  Fax: [phone]

## 2023-11-06 ENCOUNTER — Ambulatory Visit

## 2023-11-06 DIAGNOSIS — I10 Essential (primary) hypertension: Secondary | ICD-10-CM | POA: Diagnosis not present

## 2023-11-06 NOTE — Progress Notes (Signed)
 Patient is in office today for a nurse visit for Blood Pressure Check. Patient blood pressure was 179/71, Patient No chest pain, No shortness of breath, No dyspnea on exertion, No orthopnea, No paroxysmal nocturnal dyspnea, No edema, No palpitations, No syncope

## 2023-11-08 ENCOUNTER — Telehealth: Payer: Self-pay

## 2023-11-08 ENCOUNTER — Other Ambulatory Visit: Payer: Self-pay | Admitting: Family Medicine

## 2023-11-08 MED ORDER — VALSARTAN 160 MG PO TABS: 160.0000 mg | ORAL_TABLET | Freq: Every day | ORAL | 3 refills | Status: DC

## 2023-11-08 NOTE — Telephone Encounter (Signed)
 Copied from CRM #[id number]. Topic: Clinical - Lab/Test Results >> Nov 08, 2023 12:28 PM [name] wrote: Reason for CRM: Patient is wanting to know why no one has reached out to him in regards to BP recheck that was done on 11/06/2023, patient stated that he feels someone should have reached out to him since it is still high.

## 2023-11-08 NOTE — Telephone Encounter (Signed)
 Discontinue amlodipine -olmesartan . Continue hydrochlorothiazide  12.5 mg. Start valsartan  160 mg -- medication has been sent to your pharmacy. Please schedule a follow-up in 4 weeks for a nurse visit. Make sure you take your blood pressure medication 2-3 hours before your appointment

## 2023-11-09 ENCOUNTER — Other Ambulatory Visit: Payer: Self-pay | Admitting: Family Medicine

## 2023-11-09 DIAGNOSIS — I10 Essential (primary) hypertension: Secondary | ICD-10-CM

## 2023-11-09 DIAGNOSIS — M10071 Idiopathic gout, right ankle and foot: Secondary | ICD-10-CM

## 2023-11-09 NOTE — Telephone Encounter (Signed)
 Patient advised.

## 2023-11-10 ENCOUNTER — Telehealth: Payer: Self-pay

## 2023-11-10 NOTE — Telephone Encounter (Signed)
 Copied from CRM #[id number]. Topic: Clinical - Medication Question >> Nov 10, 2023 12:52 PM Rosaria E wrote: Reason for CRM: Pt has a question about his blood pressure medication and specifically which one is now discontinued he needs to relay this to his pharmacy.   Best contact: [phone]

## 2023-11-10 NOTE — Telephone Encounter (Signed)
 Spoke with [facility] and told them amlodipine  olmesartan  was discontinued and it was replaced with valsartan  and pt is aware

## 2023-11-15 ENCOUNTER — Ambulatory Visit: Payer: Self-pay

## 2023-11-15 NOTE — Telephone Encounter (Signed)
 Appt made.

## 2023-11-15 NOTE — Telephone Encounter (Signed)
 FYI Only or Action Required?: FYI only for provider.  Patient was last seen in primary care on 10/09/2023 by Edman Meade PEDLAR, FNP.  Called Nurse Triage reporting Medication Assistance.  Symptoms began x 2 days ago.  Interventions attempted: Nothing.  Symptoms are: stable.  Triage Disposition: Information or Advice Only Call, See HCP Within 4 Hours (Or PCP Triage)  Patient/caregiver understands and will follow disposition?: Yes  **Appt. Scheduled for 10/3**          Copied from CRM #[id number]. Topic: Clinical - Red Word Triage >> Nov 15, 2023  3:51 PM Wess RAMAN wrote: Red Word that prompted transfer to Nurse Triage: Blood pressure medication changed and would like to know if he should take both blood pressure medicines in the morning. Pharmacy has him taking one at night and one at night.   Elevated blood pressure. 179/76. Head feels swimmy and confused. Reason for Disposition  [1] Acting confused (e.g., disoriented, slurred speech) AND [2] brief (now gone)  Caller has medicine question only, adult not sick, AND triager answers question  Answer Assessment - Initial Assessment Questions 1. LEVEL OF CONSCIOUSNESS: How are they (the patient) acting right now? (e.g., alert-oriented, confused, lethargic, stuporous, comatose)      Patient reports being increasingly confused   2. ONSET: When did the confusion start?  (e.g., minutes, hours, days)     X 2 days  3. PATTERN: Does this come and go, or has it been constant since it started?  Is it present now?     Intermittent   4. ALCOHOL or DRUGS: Have they been drinking alcohol or taking any drugs?       No  5. NARCOTIC MEDICINES: Have they been receiving any narcotic medications? (e.g., morphine, Vicodin)     No   6. CAUSE: What do you think is causing the confusion?       He suspects the Lyrica, or his B/P medication since the changes to Valsartan  and hydrochlorothiazide   7. OTHER SYMPTOMS: Are there any  other symptoms? (e.g., difficulty breathing, fever, headache, weakness)     No .   Patient suspects the medication changes may be causing the increased confusion, aggegation, and feeling like he cannot sit still. Patient suspects  the Lyrica, or his B/P medication since the changes to Valsartan  and hydrochlorothiazide  are the reason for these new symptoms-side effects. He agreed to come in on 10/3 to discuss with PCP. He was also calling to see if he can take the  Valsartan  and hydrochlorothiazide  in the AM as he prefers, and not one in the day and one in the night like the pharmacists suggested; patient was advised that is fine.  Protocols used: Confusion - Delirium-A-AH, Medication Question Call-A-AH

## 2023-11-17 ENCOUNTER — Ambulatory Visit (INDEPENDENT_AMBULATORY_CARE_PROVIDER_SITE_OTHER): Payer: Self-pay | Admitting: Family Medicine

## 2023-11-17 ENCOUNTER — Encounter: Payer: Self-pay | Admitting: Family Medicine

## 2023-11-17 VITALS — BP 178/78 | HR 77 | Resp 18 | Ht 72.0 in | Wt 221.0 lb

## 2023-11-17 DIAGNOSIS — I10 Essential (primary) hypertension: Secondary | ICD-10-CM | POA: Diagnosis not present

## 2023-11-17 MED ORDER — OLMESARTAN-AMLODIPINE-HCTZ 40-10-12.5 MG PO TABS: 1.0000 | ORAL_TABLET | Freq: Every day | ORAL | 1 refills | Status: DC

## 2023-11-17 NOTE — Progress Notes (Signed)
 Acute Office Visit  Subjective:    Patient ID: Stephen Fuller, male    DOB: January 03, 1956, 68 y.o.   MRN: [id number]  Chief Complaint  Patient presents with   Medication Problem    Pt here with concerns of bp med changes last week. States his bp still high, has dizziness, confusion, and drowsiness. Pt states his pain Dr aslo changed his gabapentin to lyrica within the last week, this could be causing the SE's as well     HPI Patient is in today for with the above complaints.  For the details of today's visit, please refer to the assessment and plan.     Past Medical History:  Diagnosis Date   Anxiety 2022   Arthritis    Chest pain    Hepatitis C virus infection cured after antiviral drug therapy    Hypertension    Palpitations     Past Surgical History:  Procedure Laterality Date   CHOLECYSTECTOMY  2005   COLONOSCOPY  2019   LIVER BIOPSY  2019    Family History  Problem Relation Age of Onset   Diabetes Mother    Stroke Mother    Hypertension Mother    Heart disease Other        No family history   Diabetes Brother    Cirrhosis Brother    Colon cancer Neg Hx     Social History   Socioeconomic History   Marital status: Married    Spouse name: Not on file   Number of children: 2   Years of education: Not on file   Highest education level: Never attended school  Occupational History   Occupation: UNEMPLOYED  Tobacco Use   Smoking status: Every Day    Current packs/day: 0.50    Average packs/day: 0.5 packs/day for 37.0 years (18.5 ttl pk-yrs)    Types: Cigarettes   Smokeless tobacco: Never   Tobacco comments:    1/2 a pack a day  Vaping Use   Vaping status: Never Used  Substance and Sexual Activity   Alcohol use: Not Currently    Alcohol/week: 2.0 standard drinks of alcohol    Types: 2 Standard drinks or equivalent per week    Comment: Occasional   Drug use: No   Sexual activity: Not on file  Other Topics Concern   Not on file  Social History  Narrative   Not on file   Social Drivers of Health   Financial Resource Strain: Low Risk  (03/02/2023)   Overall Financial Resource Strain (CARDIA)    Difficulty of Paying Living Expenses: Not hard at all  Recent Concern: Financial Resource Strain - Medium Risk (01/08/2023)   Overall Financial Resource Strain (CARDIA)    Difficulty of Paying Living Expenses: Somewhat hard  Food Insecurity: No Food Insecurity (03/02/2023)   Hunger Vital Sign    Worried About Running Out of Food in the Last Year: Never true    Ran Out of Food in the Last Year: Never true  Recent Concern: Food Insecurity - Food Insecurity Present (01/08/2023)   Hunger Vital Sign    Worried About Running Out of Food in the Last Year: Never true    Ran Out of Food in the Last Year: Sometimes true  Transportation Needs: No Transportation Needs (03/02/2023)   PRAPARE - Administrator, Civil Service (Medical): No    Lack of Transportation (Non-Medical): No  Physical Activity: Sufficiently Active (03/02/2023)   Exercise Vital Sign  Days of Exercise per Week: 4 days    Minutes of Exercise per Session: 60 min  Stress: Stress Concern Present (03/02/2023)   [employer] of Occupational Health - Occupational Stress Questionnaire    Feeling of Stress : To some extent  Social Connections: Socially Isolated (03/02/2023)   Social Connection and Isolation Panel    Frequency of Communication with Friends and Family: Once a week    Frequency of Social Gatherings with Friends and Family: Once a week    Attends Religious Services: 1 to 4 times per year    Active Member of [employer] or Organizations: No    Attends Banker Meetings: Never    Marital Status: Separated  Intimate Partner Violence: Not At Risk (01/11/2023)   Humiliation, Afraid, Rape, and Kick questionnaire    Fear of Current or Ex-Partner: No    Emotionally Abused: No    Physically Abused: No    Sexually Abused: No    Outpatient Medications  Prior to Visit  Medication Sig Dispense Refill   albuterol  (VENTOLIN  HFA) 108 (90 Base) MCG/ACT inhaler Inhale 2 puffs into the lungs every 6 (six) hours as needed for wheezing or shortness of breath. 8 g 2   allopurinol  (ZYLOPRIM ) 300 MG tablet take 1 tablet once daily. 30 tablet 0   busPIRone (BUSPAR) 5 MG tablet Take by mouth.     diclofenac  Sodium (VOLTAREN ) 1 % GEL Apply 2 g topically 4 (four) times daily. 2 g 0   gabapentin (NEURONTIN) 400 MG capsule Take by mouth.     GOODSENSE ASPIRIN  LOW DOSE 81 MG tablet take 1 tablet once daily. 30 tablet 0   HYDROmorphone HCl (EXALGO) 8 MG TB24 Take 8 mg by mouth daily.     hydrOXYzine  (VISTARIL ) 25 MG capsule Take 1 capsule (25 mg total) by mouth at bedtime as needed. 30 capsule 1   ofloxacin  (FLOXIN ) 0.3 % OTIC solution Place 5 drops into the left ear twice daily for 7 days 5 mL 1   rosuvastatin  (CRESTOR ) 10 MG tablet take 1 tablet (10 MILLIGRAM total) by mouth at bedtime. 90 tablet 0   tadalafil  (CIALIS ) 20 MG tablet take ONE-HALF (1/2) to 1 tablet every other day as needed for erectile dysfunction 15 tablet 0   tamsulosin  (FLOMAX ) 0.4 MG CAPS capsule Take 1 capsule (0.4 mg total) by mouth daily. 30 capsule 3   testosterone  cypionate (DEPOTESTOSTERONE CYPIONATE) 200 MG/ML injection INJECT 0.5 MLS (100 MG TOTAL) INTO THE MUSCLE EVERY 7 (SEVEN) DAYS. 4 mL 2   Vitamin D , Ergocalciferol , (DRISDOL ) 1.25 MG (50000 UNIT) CAPS capsule Take 1 capsule (50,000 Units total) by mouth every 7 (seven) days. 20 capsule 1   XTAMPZA ER 13.5 MG C12A Take 1 capsule by mouth every 12 (twelve) hours.     hydrochlorothiazide  (HYDRODIURIL ) 12.5 MG tablet take (1/2) tablet by mouth daily. 60 tablet 0   valsartan  (DIOVAN ) 160 MG tablet Take 1 tablet (160 mg total) by mouth daily. 30 tablet 3   oxyCODONE-acetaminophen  (PERCOCET) 10-325 MG tablet Take 1 tablet by mouth 4 (four) times daily as needed. (Patient not taking: Reported on 11/17/2023)     traZODone  (DESYREL ) 50 MG  tablet Take 1-2 tablets (50-100 mg total) by mouth at bedtime as needed for sleep. (Patient not taking: Reported on 11/17/2023) 30 tablet 1   No facility-administered medications prior to visit.    Allergies  Allergen Reactions   Morphine And Codeine Other (See Comments)    abd pain and  inability to urinate    Review of Systems  Constitutional:  Negative for fatigue and fever.  Eyes:  Negative for visual disturbance.  Respiratory:  Negative for chest tightness and shortness of breath.   Cardiovascular:  Negative for chest pain and palpitations.  Neurological:  Negative for dizziness and headaches.       Objective:    Physical Exam HENT:     Head: Normocephalic.     Right Ear: External ear normal.     Left Ear: External ear normal.     Nose: No congestion or rhinorrhea.     Mouth/Throat:     Mouth: Mucous membranes are moist.  Cardiovascular:     Rate and Rhythm: Regular rhythm.     Heart sounds: No murmur heard. Pulmonary:     Effort: No respiratory distress.     Breath sounds: Normal breath sounds.  Neurological:     Mental Status: He is alert.     BP (!) 178/78   Pulse 77   Resp 18   Ht 6' (1.829 m)   Wt 221 lb 0.6 oz (100.3 kg)   SpO2 97%   BMI 29.98 kg/m  Wt Readings from Last 3 Encounters:  11/17/23 221 lb 0.6 oz (100.3 kg)  10/09/23 217 lb (98.4 kg)  07/06/23 229 lb (103.9 kg)       Assessment & Plan:  Essential hypertension Assessment & Plan: The patient reports recent changes to his blood pressure medication, which have been ineffective in controlling his hypertension. He notes occasional dizziness with rapid position changes but denies other associated symptoms.  Will discontinue valsartan  160 mg and hydrochlorothiazide  12.5 mg daily. Encouraged the patient to begin taking olmesartan -amlodipine -HCTZ 40-10-12.5 mg daily to help decrease pill burden.  Reviewed long-term considerations with the patient. Uncontrolled hypertension can increase the  risk of cardiovascular complications, including stroke, coronary artery disease, and heart failure. BP Readings from Last 3 Encounters:  11/17/23 (!) 178/78  11/06/23 (!) 179/71  10/09/23 (!) 168/74     Orders: -     Olmesartan -amLODIPine -HCTZ; Take 1 tablet by mouth daily.  Dispense: 90 tablet; Refill: 1  Note: This chart has been completed using Engineer, civil (consulting) software, and while attempts have been made to ensure accuracy, certain words and phrases may not be transcribed as intended.    Haya Hemler  Z Bacchus, FNP

## 2023-11-17 NOTE — Patient Instructions (Addendum)
 I appreciate the opportunity to provide care to you today!    Follow up:  1 months BP with Meade, may used blocked appointment    Hypertension Management  Your current blood pressure is above the target goal of <140/90 mmHg. To address this, please discontinue valsartan  160 mg and hydrochlorothiazide  12.5 mg daily.  Start taking olmesartan -amlodipine -HCTZ 40-10-12.5 mg daily to help decrease pill burden.   Medication Instructions: Take your blood pressure medication at the same time each day. After taking your medication, check your blood pressure at least an hour later. If your first reading is >140/90 mmHg, wait at least 10 minutes and recheck your blood pressure. Side Effects: In the initial days of therapy, you may experience dizziness or lightheadedness as your body adjusts to the lower blood pressure; this is expected. Diet and Lifestyle: Adhere to a low-sodium diet, limiting intake to less than 1500 mg daily, and increase your physical activity. Avoid over-the-counter NSAIDs such as ibuprofen and naproxen  while on this medication. Hydration and Nutrition: Stay well-hydrated by drinking at least 64 ounces of water daily. Increase your servings of fruits and vegetables and avoid excessive sodium in your diet. Long-Term Considerations: Uncontrolled hypertension can increase the risk of cardiovascular diseases, including stroke, coronary artery disease, and heart failure.  Please report to the emergency department if your blood pressure exceeds 180/120 and is accompanied by symptoms such as headaches, chest pain, palpitations, blurred vision, or dizziness.   Please follow up if your symptoms worsen or fail to improve.   Please stop by your local pharmacy and get your Tdap and Shingles vaccine  Referrals today-   Attached with your AVS, you will find valuable resources for self-education. I highly recommend dedicating some time to thoroughly examine them.   Please continue to a  heart-healthy diet and increase your physical activities. Try to exercise for at least five days a week.    It was a pleasure to see you and I look forward to continuing to work together on your health and well-being. Please do not hesitate to call the office if you need care or have questions about your care.  In case of emergency, please visit the Emergency Department for urgent care, or contact our clinic at [phone] to schedule an appointment. We're here to help you!   Have a wonderful day and week. With Gratitude, Meade JENEANE Gerlach MSN, FNP-BC, PMHNP-BC

## 2023-11-17 NOTE — Assessment & Plan Note (Signed)
 The patient reports recent changes to his blood pressure medication, which have been ineffective in controlling his hypertension. He notes occasional dizziness with rapid position changes but denies other associated symptoms.  Will discontinue valsartan  160 mg and hydrochlorothiazide  12.5 mg daily. Encouraged the patient to begin taking olmesartan -amlodipine -HCTZ 40-10-12.5 mg daily to help decrease pill burden.  Reviewed long-term considerations with the patient. Uncontrolled hypertension can increase the risk of cardiovascular complications, including stroke, coronary artery disease, and heart failure. BP Readings from Last 3 Encounters:  11/17/23 (!) 178/78  11/06/23 (!) 179/71  10/09/23 (!) 168/74

## 2023-11-21 ENCOUNTER — Other Ambulatory Visit

## 2023-11-21 ENCOUNTER — Ambulatory Visit

## 2023-11-21 DIAGNOSIS — M5417 Radiculopathy, lumbosacral region: Secondary | ICD-10-CM | POA: Diagnosis not present

## 2023-11-21 DIAGNOSIS — G894 Chronic pain syndrome: Secondary | ICD-10-CM | POA: Diagnosis not present

## 2023-11-21 DIAGNOSIS — E230 Hypopituitarism: Secondary | ICD-10-CM

## 2023-11-21 DIAGNOSIS — R972 Elevated prostate specific antigen [PSA]: Secondary | ICD-10-CM

## 2023-11-21 DIAGNOSIS — M5451 Vertebrogenic low back pain: Secondary | ICD-10-CM | POA: Diagnosis not present

## 2023-11-21 DIAGNOSIS — M542 Cervicalgia: Secondary | ICD-10-CM | POA: Diagnosis not present

## 2023-11-22 ENCOUNTER — Ambulatory Visit: Payer: Self-pay

## 2023-11-22 LAB — PSA, TOTAL AND FREE
PSA, Free Pct: 22.7 %
PSA, Free: 1.09 ng/mL
Prostate Specific Ag, Serum: 4.8 ng/mL — ABNORMAL HIGH (ref 0.0–4.0)

## 2023-11-22 LAB — TESTOSTERONE: Testosterone: 546 ng/dL (ref 264–916)

## 2023-11-22 LAB — HEMOGLOBIN AND HEMATOCRIT, BLOOD
Hematocrit: 47 % (ref 37.5–51.0)
Hemoglobin: 15.8 g/dL (ref 13.0–17.7)

## 2023-11-24 ENCOUNTER — Ambulatory Visit: Admitting: Urology

## 2023-11-24 VITALS — BP 144/62 | HR 96

## 2023-11-24 DIAGNOSIS — E291 Testicular hypofunction: Secondary | ICD-10-CM | POA: Diagnosis not present

## 2023-11-24 DIAGNOSIS — R972 Elevated prostate specific antigen [PSA]: Secondary | ICD-10-CM

## 2023-11-24 DIAGNOSIS — R399 Unspecified symptoms and signs involving the genitourinary system: Secondary | ICD-10-CM

## 2023-11-24 LAB — URINALYSIS, ROUTINE W REFLEX MICROSCOPIC
Bilirubin, UA: NEGATIVE
Glucose, UA: NEGATIVE
Leukocytes,UA: NEGATIVE
Nitrite, UA: NEGATIVE
Protein,UA: NEGATIVE
RBC, UA: NEGATIVE
Specific Gravity, UA: 1.01 (ref 1.005–1.030)
Urobilinogen, Ur: 0.2 mg/dL (ref 0.2–1.0)
pH, UA: 6 (ref 5.0–7.5)

## 2023-11-24 MED ORDER — TAMSULOSIN HCL 0.4 MG PO CAPS: 0.4000 mg | ORAL_CAPSULE | Freq: Every day | ORAL | 11 refills | Status: DC

## 2023-11-24 MED ORDER — TESTOSTERONE CYPIONATE 200 MG/ML IM SOLN: 100.0000 mg | INTRAMUSCULAR | 5 refills | Status: AC

## 2023-11-24 NOTE — Progress Notes (Signed)
 11/24/2023 12:26 PM   Stephen Fuller 30-Sep-1955 [id number]  Referring provider: Edman Meade PEDLAR, FNP [address] [place],  [place] [address]  [place] hypogonadism and elevated PSA  HPI: Mr Stephen Fuller is a 68yo here for followup for hypogonadism and elevated PSA. Testosteroen 546, hemoglobin 15.8, PSA 4.8. Energy is good. Libido is good. No worsening LUTS. He remains on flomax  0.4mg  daily. He injects 100mg  of IM testosterone  weekly.    PMH: Past Medical History:  Diagnosis Date   Anxiety 2022   Arthritis    Chest pain    Hepatitis C virus infection cured after antiviral drug therapy    Hypertension    Palpitations     Surgical History: Past Surgical History:  Procedure Laterality Date   CHOLECYSTECTOMY  2005   COLONOSCOPY  2019   LIVER BIOPSY  2019    Home Medications:  Allergies as of 11/24/2023       Reactions   Morphine And Codeine Other (See Comments)   abd pain and inability to urinate        Medication List        Accurate as of November 24, 2023 12:26 PM. If you have any questions, ask your nurse or doctor.          albuterol  108 (90 Base) MCG/ACT inhaler Commonly known as: VENTOLIN  HFA Inhale 2 puffs into the lungs every 6 (six) hours as needed for wheezing or shortness of breath.   allopurinol  300 MG tablet Commonly known as: ZYLOPRIM  take 1 tablet once daily.   busPIRone 5 MG tablet Commonly known as: BUSPAR Take by mouth.   diclofenac  Sodium 1 % Gel Commonly known as: VOLTAREN  Apply 2 g topically 4 (four) times daily.   gabapentin 400 MG capsule Commonly known as: NEURONTIN Take by mouth.   GoodSense Aspirin  Low Dose 81 MG tablet Generic drug: aspirin  EC take 1 tablet once daily.   HYDROmorphone HCl 8 MG Tb24 Commonly known as: EXALGO Take 8 mg by mouth daily.   hydrOXYzine  25 MG capsule Commonly known as: VISTARIL  Take 1 capsule (25 mg total) by mouth at bedtime as needed.   ofloxacin  0.3 % OTIC solution Commonly  known as: FLOXIN  Place 5 drops into the left ear twice daily for 7 days   Olmesartan -amLODIPine -HCTZ 40-10-12.5 MG Tabs Take 1 tablet by mouth daily.   oxyCODONE-acetaminophen  10-325 MG tablet Commonly known as: PERCOCET Take 1 tablet by mouth 4 (four) times daily as needed.   rosuvastatin  10 MG tablet Commonly known as: CRESTOR  take 1 tablet (10 MILLIGRAM total) by mouth at bedtime.   tadalafil  20 MG tablet Commonly known as: CIALIS  take ONE-HALF (1/2) to 1 tablet every other day as needed for erectile dysfunction   tamsulosin  0.4 MG Caps capsule Commonly known as: FLOMAX  Take 1 capsule (0.4 mg total) by mouth daily.   testosterone  cypionate 200 MG/ML injection Commonly known as: DEPOTESTOSTERONE CYPIONATE Inject 0.5 mLs (100 mg total) into the muscle every 7 (seven) days.   traZODone  50 MG tablet Commonly known as: DESYREL  Take 1-2 tablets (50-100 mg total) by mouth at bedtime as needed for sleep.   Vitamin D  (Ergocalciferol ) 1.25 MG (50000 UNIT) Caps capsule Commonly known as: DRISDOL  Take 1 capsule (50,000 Units total) by mouth every 7 (seven) days.   Xtampza ER 13.5 MG C12a Generic drug: oxyCODONE ER Take 1 capsule by mouth every 12 (twelve) hours.        Allergies:  Allergies  Allergen Reactions  Morphine And Codeine Other (See Comments)    abd pain and inability to urinate    Family History: Family History  Problem Relation Age of Onset   Diabetes Mother    Stroke Mother    Hypertension Mother    Heart disease Other        No family history   Diabetes Brother    Cirrhosis Brother    Colon cancer Neg Hx     Social History:  reports that he has been smoking cigarettes. He has a 18.5 pack-year smoking history. He has never used smokeless tobacco. He reports that he does not currently use alcohol after a past usage of about 2.0 standard drinks of alcohol per week. He reports that he does not use drugs.  ROS: All other review of systems were  reviewed and are negative except what is noted above in HPI  Physical Exam: BP (!) 144/62   Pulse 96   Constitutional:  Alert and oriented, No acute distress. HEENT: [place] AT, moist mucus membranes.  Trachea midline, no masses. Cardiovascular: No clubbing, cyanosis, or edema. Respiratory: Normal respiratory effort, no increased work of breathing. GI: Abdomen is soft, nontender, nondistended, no abdominal masses GU: No CVA tenderness.  Lymph: No cervical or inguinal lymphadenopathy. Skin: No rashes, bruises or suspicious lesions. Neurologic: Grossly intact, no focal deficits, moving all 4 extremities. Psychiatric: Normal mood and affect.  Laboratory Data: Lab Results  Component Value Date   WBC 12.0 (H) 10/09/2023   HGB 15.8 11/21/2023   HCT 47.0 11/21/2023   MCV 93 10/09/2023   PLT 294 10/09/2023    Lab Results  Component Value Date   CREATININE 1.12 10/09/2023    No results found for: PSA  Lab Results  Component Value Date   TESTOSTERONE  546 11/21/2023    Lab Results  Component Value Date   HGBA1C 6.0 (H) 10/09/2023    Urinalysis    Component Value Date/Time   APPEARANCEUR Clear 10/09/2023 1511   GLUCOSEU Negative 10/09/2023 1511   BILIRUBINUR Negative 10/09/2023 1511   PROTEINUR Negative 10/09/2023 1511   NITRITE Negative 10/09/2023 1511   LEUKOCYTESUR Negative 10/09/2023 1511    Lab Results  Component Value Date   LABMICR Comment 05/25/2023    Pertinent Imaging:  No results found for this or any previous visit.  No results found for this or any previous visit.  No results found for this or any previous visit.  No results found for this or any previous visit.  No results found for this or any previous visit.  No results found for this or any previous visit.  No results found for this or any previous visit.  No results found for this or any previous visit.   Assessment & Plan:    1. Elevated PSA (Primary) Followup 6 months with PSA -  Urinalysis, Routine w reflex microscopic - Testosterone ,Free and Total; Future - Comprehensive metabolic panel; Future - CBC; Future - PSA, total and free; Future  2. Hypogonadism Continue 100m IM testosterone  weekly Followup 6 months with labs - tamsulosin  (FLOMAX ) 0.4 MG CAPS capsule; Take 1 capsule (0.4 mg total) by mouth daily.  Dispense: 30 capsule; Refill: 11   No follow-ups on file.  Belvie Clara, MD  [facility]

## 2023-11-29 ENCOUNTER — Ambulatory Visit: Payer: Self-pay

## 2023-11-29 NOTE — Telephone Encounter (Signed)
 FYI Only or Action Required?: FYI only for provider.  Patient was last seen in primary care on 11/17/2023 by Edman Meade PEDLAR, FNP.  Called Nurse Triage reporting Medication Problem.  Symptoms began several weeks ago.  Interventions attempted: Rest, hydration, or home remedies.  Symptoms are: gradually worsening.  Triage Disposition: See PCP When Office is Open (Within 3 Days)  Patient/caregiver understands and will follow disposition?: Yes  Copied from CRM #[id number]. Topic: Clinical - Red Word Triage >> Nov 29, 2023 11:49 AM Gustabo D wrote: Olmesartan -amLODIPine -HCTZ 40-10-12.5 MG TABS- Pt says medication isn't working and says he's losing weight and having to force his food down, muscle aches can't even go to the gym and workout, diarrhea, no appetite and tired don't want to go out the house, he use to go to the gym 5 days a week and now he can't. He felt better on previously bp meds even though his bp was high and now he feels like crap. When he tries to work out it goes down to 50 when he don't workout it's low 60s. Reason for Disposition  Prescription request for new medicine (not a refill)  Answer Assessment - Initial Assessment Questions Pt was recently started on olmesartan -amlodipine -hydrochlorothiazide . States med is helping bp but causing him severe issues to include weight loss, muscle aches, diarrhea, decreased appetite. States he is so fatigued and miserable he cannot even go to the gym or leave the house. Started oct 3  1. NAME of MEDICINE: What medicine(s) are you calling about?     Olmesartan -amlodipine -hydrochlorothiazide  40-10-12.5mg  2. QUESTION: What is your question? (e.g., double dose of medicine, side effect)     Can we do something else 3. PRESCRIBER: Who prescribed the medicine? Reason: if prescribed by specialist, call should be referred to that group.     Gloria Bacchus 4. SYMPTOMS: Do you have any symptoms? If Yes, ask: What symptoms are you  having?  How bad are the symptoms (e.g., mild, moderate, severe)     weight loss, muscle aches, diarrhea, decreased appetite. States he is so fatigued and miserable he cannot even go to the gym or leave the house.  Protocols used: Medication Question Call-A-AH

## 2023-11-29 NOTE — Telephone Encounter (Signed)
 Appt made.

## 2023-11-30 ENCOUNTER — Ambulatory Visit (INDEPENDENT_AMBULATORY_CARE_PROVIDER_SITE_OTHER): Payer: Self-pay | Admitting: Family Medicine

## 2023-11-30 VITALS — BP 168/51 | HR 101 | Ht 72.0 in | Wt 220.4 lb

## 2023-11-30 DIAGNOSIS — I10 Essential (primary) hypertension: Secondary | ICD-10-CM | POA: Diagnosis not present

## 2023-11-30 MED ORDER — HYDROCHLOROTHIAZIDE 12.5 MG PO TABS: 12.5000 mg | ORAL_TABLET | Freq: Every day | ORAL | 3 refills | Status: DC

## 2023-11-30 MED ORDER — AMLODIPINE-OLMESARTAN 10-40 MG PO TABS: 1.0000 | ORAL_TABLET | Freq: Every day | ORAL | 3 refills | Status: AC

## 2023-11-30 NOTE — Patient Instructions (Signed)
 Medications as prescribed.  Follow up in 2 weeks with your provider.

## 2023-12-03 NOTE — Progress Notes (Signed)
 Subjective:  Patient ID: Stephen Fuller, male    DOB: 1955-09-15  Age: 68 y.o. MRN: [id number]  CC:  Hypertension  HPI:  68 year old male with a longstanding history of uncontrolled hypertension presents with complaints that his medication is not agreeing with him.  Patient's blood pressure medication recently changed to olmesartan -amlodipine -HCTZ (combination pill).  He states that he feels very poorly when he takes his medication.  Feels very groggy and has difficulty going to the gym.  He states that it is not agreeing with him.  He states that he previously felt well on prior regimen.  He states that his blood pressure was uncontrolled but his diastolics were not too low.  He states that his diastolic gets below 60 he feels very poorly.  He states that his regimen from earlier in the year worked well for him (he felt physically well).  The regimen he is discussing is essentially the same medication that he is on now just not in a combination pill.  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   Essential hypertension 02/16/2012   Palpitations 02/16/2012   Elevated LFTs 02/16/2012    Social Hx   Social History   Socioeconomic History   Marital status: Married    Spouse name: Not on file   Number of children: 2   Years of education: Not on file   Highest education level: Never attended school  Occupational History   Occupation: UNEMPLOYED   Tobacco Use   Smoking status: Every Day    Current packs/day: 0.50    Average packs/day: 0.5 packs/day for 37.0 years (18.5 ttl pk-yrs)    Types: Cigarettes   Smokeless tobacco: Never   Tobacco comments:    1/2 a pack a day  Vaping Use   Vaping status: Never Used  Substance and Sexual Activity   Alcohol use: Not Currently    Alcohol/week: 2.0 standard drinks of alcohol    Types: 2 Standard drinks or equivalent per week    Comment: Occasional   Drug use: No   Sexual activity: Not on file  Other Topics Concern   Not on file  Social History Narrative   Not on file   Social Drivers of Health   Financial Resource Strain: Low Risk  (03/02/2023)   Overall Financial Resource Strain (CARDIA)    Difficulty of Paying Living Expenses: Not hard at all  Recent Concern: Financial Resource Strain - Medium Risk (01/08/2023)   Overall Financial Resource Strain (CARDIA)    Difficulty of Paying Living Expenses: Somewhat hard  Food Insecurity: No Food Insecurity (03/02/2023)   Hunger Vital Sign    Worried About Running Out of Food in the Last Year: Never true    Ran Out of Food in the Last Year: Never true  Recent Concern: Food Insecurity - Food Insecurity Present (01/08/2023)   Hunger Vital Sign    Worried About Running Out of Food in the Last Year: Never true  Ran Out of Food in the Last Year: Sometimes true  Transportation Needs: No Transportation Needs (03/02/2023)   PRAPARE - Administrator, Civil Service (Medical): No    Lack of Transportation (Non-Medical): No  Physical Activity: Sufficiently Active (03/02/2023)   Exercise Vital Sign    Days of Exercise per Week: 4 days    Minutes of Exercise per Session: 60 min  Stress: Stress Concern Present (03/02/2023)   [employer] of Occupational Health - Occupational Stress Questionnaire    Feeling of Stress : To some extent  Social Connections: Socially Isolated (03/02/2023)   Social Connection and Isolation Panel     Frequency of Communication with Friends and Family: Once a week    Frequency of Social Gatherings with Friends and Family: Once a week    Attends Religious Services: 1 to 4 times per year    Active Member of [employer] or Organizations: No    Attends Banker Meetings: Never    Marital Status: Separated    Review of Systems Per HPI  Objective:  BP (!) 168/51   Pulse (!) 101   Ht 6' (1.829 m)   Wt 220 lb 6 oz (100 kg)   BMI 29.89 kg/m      11/30/2023    4:42 PM 11/24/2023   12:16 PM 11/17/2023   10:08 AM  BP/Weight  Systolic BP 168 144 178  Diastolic BP 51 62 78  Wt. (Lbs) 220.38  221.04  BMI 29.89 kg/m2  29.98 kg/m2    Physical Exam Vitals and nursing note reviewed.  Constitutional:      General: He is not in acute distress.    Appearance: Normal appearance.  HENT:     Head: Normocephalic and atraumatic.  Cardiovascular:     Rate and Rhythm: Normal rate and regular rhythm.  Pulmonary:     Effort: Pulmonary effort is normal.     Breath sounds: Normal breath sounds.  Neurological:     Mental Status: He is alert.     Lab Results  Component Value Date   WBC 12.0 (H) 10/09/2023   HGB 15.8 11/21/2023   HCT 47.0 11/21/2023   PLT 294 10/09/2023   GLUCOSE 90 10/09/2023   CHOL 102 10/09/2023   TRIG 259 (H) 10/09/2023   HDL 33 (L) 10/09/2023   LDLCALC 30 10/09/2023   ALT 30 10/09/2023   AST 23 10/09/2023   NA 140 10/09/2023   K 4.1 10/09/2023   CL 102 10/09/2023   CREATININE 1.12 10/09/2023   BUN 23 10/09/2023   CO2 20 10/09/2023   TSH 1.520 10/09/2023   INR 0.94 03/09/2017   HGBA1C 6.0 (H) 10/09/2023     Assessment & Plan:  Essential hypertension Assessment & Plan: Uncontrolled.  Wide pulse pressure.  Would benefit from referral to cardiology Dr. Raford who specializes in hypertension. I have switched his medication to Azor  10-40 mg and HCTZ 12.5 mg daily.  He will follow-up with his provider in 2 weeks.  Orders: -      amLODIPine -Olmesartan ; Take 1 tablet by mouth daily.  Dispense: 90 tablet; Refill: 3 -     hydroCHLOROthiazide ; Take 1 tablet (12.5 mg total) by mouth daily.  Dispense: 90 tablet; Refill: 3    Follow-up:  2 weeks with PCP  Jacqulyn Ahle DO [facility]

## 2023-12-03 NOTE — Assessment & Plan Note (Signed)
 Uncontrolled.  Wide pulse pressure.  Would benefit from referral to cardiology Dr. Raford who specializes in hypertension. I have switched his medication to Azor  10-40 mg and HCTZ 12.5 mg daily.  He will follow-up with his provider in 2 weeks.

## 2023-12-05 ENCOUNTER — Encounter: Payer: Self-pay | Admitting: Urology

## 2023-12-08 ENCOUNTER — Other Ambulatory Visit: Payer: Self-pay | Admitting: Family Medicine

## 2023-12-08 DIAGNOSIS — G479 Sleep disorder, unspecified: Secondary | ICD-10-CM

## 2023-12-08 DIAGNOSIS — M10071 Idiopathic gout, right ankle and foot: Secondary | ICD-10-CM

## 2023-12-09 ENCOUNTER — Other Ambulatory Visit: Payer: Self-pay | Admitting: Family Medicine

## 2023-12-18 ENCOUNTER — Other Ambulatory Visit: Payer: Self-pay | Admitting: Family Medicine

## 2023-12-18 ENCOUNTER — Encounter: Payer: Self-pay | Admitting: Family Medicine

## 2023-12-18 ENCOUNTER — Ambulatory Visit (INDEPENDENT_AMBULATORY_CARE_PROVIDER_SITE_OTHER): Admitting: Family Medicine

## 2023-12-18 VITALS — BP 150/78 | HR 90 | Ht 72.0 in | Wt 222.0 lb

## 2023-12-18 DIAGNOSIS — I10 Essential (primary) hypertension: Secondary | ICD-10-CM

## 2023-12-18 DIAGNOSIS — B079 Viral wart, unspecified: Secondary | ICD-10-CM | POA: Insufficient documentation

## 2023-12-18 NOTE — Patient Instructions (Addendum)
 I appreciate the opportunity to provide care to you today!  -Continue current treatment regimen as prescribed. -Follow up with cardiology as per referral placed. -Maintain a low-sodium diet and increase physical activity as tolerated.  Long-Term Considerations: Uncontrolled hypertension increases the risk of serious cardiovascular complications, including stroke, coronary artery disease, and heart failure. Continued adherence to treatment and lifestyle modifications is essential to reduce these risks.  Emergency Precautions: Please seek immediate medical care or go to the emergency department if your blood pressure exceeds 180/120 mmHg and you experience symptoms such as headache, chest pain, palpitations, blurred vision, dizziness, or shortness of breath.  Patient verbalized understanding.   Please follow up if your symptoms worsen or fail to improve.  Referrals today- [facility]    Please continue to a heart-healthy diet and increase your physical activities. Try to exercise for at least five days a week.    It was a pleasure to see you and I look forward to continuing to work together on your health and well-being. Please do not hesitate to call the office if you need care or have questions about your care.  In case of emergency, please visit the Emergency Department for urgent care, or contact our clinic at [phone] to schedule an appointment. We're here to help you!   Have a wonderful day and week. With Gratitude, Meade JENEANE Gerlach MSN, FNP-BC, PMHNP-BC

## 2023-12-18 NOTE — Assessment & Plan Note (Signed)
 Patient reports taking amlodipine -olmesartan  10/40 mg with hydrochlorothiazide  12.5 mg. -He states he has been taking  tablet of HCTZ 12.5 mg due to previously low diastolic blood pressure readings. -Due to consistently elevated BP readings, a referral to cardiology / advanced hypertension clinic has been placed for collaborative management. -Encouraged patient to continue current medication regimen as prescribed. -Reinforced importance of medication adherence and lifestyle modifications, including low-sodium diet and regular physical activity. -Emergency precautions reviewed: patient advised to present to the ED if BP is >=180/120 mmHg and accompanied by symptoms such as chest pain, shortness of breath, severe headache unrelieved by acetaminophen , vision changes, or palpitations. -Discussed long-term risks of uncontrolled hypertension, including cardiovascular disease and stroke. -Patient verbalized understanding.

## 2023-12-18 NOTE — Assessment & Plan Note (Signed)
 Referral placed to dermatology

## 2023-12-18 NOTE — Progress Notes (Deleted)
 Established Patient Office Visit  Subjective:  Patient ID: Stephen Fuller, male    DOB: [birth year]  Age: 68 y.o. MRN: [id number]  CC: No chief complaint on file.   HPI Stephen Fuller is a 68 y.o. male with past medical history of *** presents for f/u of *** chronic medical conditions.   Warts left arm, gets bigger, use the sand thing to sand it down. , itch sometimes  BP Readings from Last 3 Encounters:  12/18/23 (!) 185/71  11/30/23 (!) 168/51  11/24/23 (!) 144/62     Past Medical History:  Diagnosis Date   Anxiety 2022   Arthritis    Chest pain    Hepatitis C virus infection cured after antiviral drug therapy    Hypertension    Palpitations     Past Surgical History:  Procedure Laterality Date   CHOLECYSTECTOMY  2005   COLONOSCOPY  2019   LIVER BIOPSY  2019    Family History  Problem Relation Age of Onset   Diabetes Mother    Stroke Mother    Hypertension Mother    Heart disease Other        No family history   Diabetes Brother    Cirrhosis Brother    Colon cancer Neg Hx     Social History   Socioeconomic History   Marital status: Married    Spouse name: Not on file   Number of children: 2   Years of education: Not on file   Highest education level: Never attended school  Occupational History   Occupation: UNEMPLOYED  Tobacco Use   Smoking status: Every Day    Current packs/day: 0.50    Average packs/day: 0.5 packs/day for 37.0 years (18.5 ttl pk-yrs)    Types: Cigarettes   Smokeless tobacco: Never   Tobacco comments:    1/2 a pack a day  Vaping Use   Vaping status: Never Used  Substance and Sexual Activity   Alcohol use: Not Currently    Alcohol/week: 2.0 standard drinks of alcohol    Types: 2 Standard drinks or equivalent per week    Comment: Occasional   Drug use: No   Sexual activity: Not on file  Other Topics Concern   Not on file  Social History Narrative   Not on file   Social Drivers of Health   Financial Resource Strain:  Low Risk  (03/02/2023)   Overall Financial Resource Strain (CARDIA)    Difficulty of Paying Living Expenses: Not hard at all  Recent Concern: Financial Resource Strain - Medium Risk (01/08/2023)   Overall Financial Resource Strain (CARDIA)    Difficulty of Paying Living Expenses: Somewhat hard  Food Insecurity: No Food Insecurity (03/02/2023)   Hunger Vital Sign    Worried About Running Out of Food in the Last Year: Never true    Ran Out of Food in the Last Year: Never true  Recent Concern: Food Insecurity - Food Insecurity Present (01/08/2023)   Hunger Vital Sign    Worried About Running Out of Food in the Last Year: Never true    Ran Out of Food in the Last Year: Sometimes true  Transportation Needs: No Transportation Needs (03/02/2023)   PRAPARE - Administrator, Civil Service (Medical): No    Lack of Transportation (Non-Medical): No  Physical Activity: Sufficiently Active (03/02/2023)   Exercise Vital Sign    Days of Exercise per Week: 4 days    Minutes of Exercise per Session: 60  min  Stress: Stress Concern Present (03/02/2023)   [employer] of Occupational Health - Occupational Stress Questionnaire    Feeling of Stress : To some extent  Social Connections: Socially Isolated (03/02/2023)   Social Connection and Isolation Panel    Frequency of Communication with Friends and Family: Once a week    Frequency of Social Gatherings with Friends and Family: Once a week    Attends Religious Services: 1 to 4 times per year    Active Member of [employer] or Organizations: No    Attends Banker Meetings: Never    Marital Status: Separated  Intimate Partner Violence: Not At Risk (01/11/2023)   Humiliation, Afraid, Rape, and Kick questionnaire    Fear of Current or Ex-Partner: No    Emotionally Abused: No    Physically Abused: No    Sexually Abused: No    Outpatient Medications Prior to Visit  Medication Sig Dispense Refill   albuterol  (VENTOLIN  HFA) 108 (90  Base) MCG/ACT inhaler Inhale 2 puffs into the lungs every 6 (six) hours as needed for wheezing or shortness of breath. 8 g 2   allopurinol  (ZYLOPRIM ) 300 MG tablet take 1 tablet once daily. 30 tablet 0   amLODipine -olmesartan  (AZOR ) 10-40 MG tablet Take 1 tablet by mouth daily. 90 tablet 3   busPIRone (BUSPAR) 5 MG tablet Take by mouth.     diclofenac  Sodium (VOLTAREN ) 1 % GEL Apply 2 g topically 4 (four) times daily. 2 g 0   gabapentin (NEURONTIN) 400 MG capsule Take by mouth.     GOODSENSE ASPIRIN  LOW DOSE 81 MG tablet take 1 tablet once daily. 30 tablet 0   hydrochlorothiazide  (HYDRODIURIL ) 12.5 MG tablet Take 1 tablet (12.5 mg total) by mouth daily. 90 tablet 3   HYDROmorphone HCl (EXALGO) 8 MG TB24 Take 8 mg by mouth daily.     hydrOXYzine  (VISTARIL ) 25 MG capsule take 1 capsule (25 MILLIGRAM total) by mouth at bedtime as needed. 30 capsule 0   ofloxacin  (FLOXIN ) 0.3 % OTIC solution Place 5 drops into the left ear twice daily for 7 days 5 mL 1   oxyCODONE-acetaminophen  (PERCOCET) 10-325 MG tablet Take 1 tablet by mouth 4 (four) times daily as needed. (Patient not taking: Reported on 11/17/2023)     rosuvastatin  (CRESTOR ) 10 MG tablet take 1 tablet (10 milligram total) by mouth at bedtime. 90 tablet 0   tadalafil  (CIALIS ) 20 MG tablet take ONE-HALF (1/2) to 1 tablet every other day as needed for erectile dysfunction 15 tablet 0   tamsulosin  (FLOMAX ) 0.4 MG CAPS capsule Take 1 capsule (0.4 mg total) by mouth daily. 30 capsule 11   testosterone  cypionate (DEPOTESTOSTERONE CYPIONATE) 200 MG/ML injection Inject 0.5 mLs (100 mg total) into the muscle every 7 (seven) days. 4 mL 5   Vitamin D , Ergocalciferol , (DRISDOL ) 1.25 MG (50000 UNIT) CAPS capsule Take 1 capsule (50,000 Units total) by mouth every 7 (seven) days. 20 capsule 1   No facility-administered medications prior to visit.    Allergies  Allergen Reactions   Morphine And Codeine Other (See Comments)    abd pain and inability to urinate     ROS Review of Systems    Objective:    Physical Exam  There were no vitals taken for this visit. Wt Readings from Last 3 Encounters:  12/18/23 222 lb (100.7 kg)  11/30/23 220 lb 6 oz (100 kg)  11/17/23 221 lb 0.6 oz (100.3 kg)    Lab Results  Component Value  Date   TSH 1.520 10/09/2023   Lab Results  Component Value Date   WBC 12.0 (H) 10/09/2023   HGB 15.8 11/21/2023   HCT 47.0 11/21/2023   MCV 93 10/09/2023   PLT 294 10/09/2023   Lab Results  Component Value Date   NA 140 10/09/2023   K 4.1 10/09/2023   CO2 20 10/09/2023   GLUCOSE 90 10/09/2023   BUN 23 10/09/2023   CREATININE 1.12 10/09/2023   BILITOT 0.3 10/09/2023   ALKPHOS 79 10/09/2023   AST 23 10/09/2023   ALT 30 10/09/2023   PROT 7.1 10/09/2023   ALBUMIN 4.8 10/09/2023   CALCIUM  9.9 10/09/2023   ANIONGAP 7 10/20/2017   EGFR 72 10/09/2023   Lab Results  Component Value Date   CHOL 102 10/09/2023   Lab Results  Component Value Date   HDL 33 (L) 10/09/2023   Lab Results  Component Value Date   LDLCALC 30 10/09/2023   Lab Results  Component Value Date   TRIG 259 (H) 10/09/2023   Lab Results  Component Value Date   CHOLHDL 3.1 10/09/2023   Lab Results  Component Value Date   HGBA1C 6.0 (H) 10/09/2023      Assessment & Plan:  Essential hypertension -     Ambulatory referral to [facility]    Follow-up: No follow-ups on file.   Lisa Blakeman  Z Bacchus, FNP

## 2023-12-18 NOTE — Progress Notes (Signed)
 Established Patient Office Visit  Subjective:  Patient ID: Stephen Fuller, male    DOB: [birth year]  Age: 68 y.o. MRN: [id number]  CC:  Chief Complaint  Patient presents with   Hypertension    Two week follow up    skin spot    Skin spot on left forearm     HPI Stephen Fuller is a 68 y.o. male with past medical history of essential hypertension presents for blood pressure f/u.  For the details of today's visit, please refer to the assessment and plan.     Past Medical History:  Diagnosis Date   Anxiety 2022   Arthritis    Chest pain    Hepatitis C virus infection cured after antiviral drug therapy    Hypertension    Palpitations     Past Surgical History:  Procedure Laterality Date   CHOLECYSTECTOMY  2005   COLONOSCOPY  2019   LIVER BIOPSY  2019    Family History  Problem Relation Age of Onset   Diabetes Mother    Stroke Mother    Hypertension Mother    Heart disease Other        No family history   Diabetes Brother    Cirrhosis Brother    Colon cancer Neg Hx     Social History   Socioeconomic History   Marital status: Married    Spouse name: Not on file   Number of children: 2   Years of education: Not on file   Highest education level: Never attended school  Occupational History   Occupation: UNEMPLOYED  Tobacco Use   Smoking status: Every Day    Current packs/day: 0.50    Average packs/day: 0.5 packs/day for 37.0 years (18.5 ttl pk-yrs)    Types: Cigarettes   Smokeless tobacco: Never   Tobacco comments:    1/2 a pack a day  Vaping Use   Vaping status: Never Used  Substance and Sexual Activity   Alcohol use: Not Currently    Alcohol/week: 2.0 standard drinks of alcohol    Types: 2 Standard drinks or equivalent per week    Comment: Occasional   Drug use: No   Sexual activity: Not on file  Other Topics Concern   Not on file  Social History Narrative   Not on file   Social Drivers of Health   Financial Resource Strain: Low Risk   (03/02/2023)   Overall Financial Resource Strain (CARDIA)    Difficulty of Paying Living Expenses: Not hard at all  Recent Concern: Financial Resource Strain - Medium Risk (01/08/2023)   Overall Financial Resource Strain (CARDIA)    Difficulty of Paying Living Expenses: Somewhat hard  Food Insecurity: No Food Insecurity (03/02/2023)   Hunger Vital Sign    Worried About Running Out of Food in the Last Year: Never true    Ran Out of Food in the Last Year: Never true  Recent Concern: Food Insecurity - Food Insecurity Present (01/08/2023)   Hunger Vital Sign    Worried About Running Out of Food in the Last Year: Never true    Ran Out of Food in the Last Year: Sometimes true  Transportation Needs: No Transportation Needs (03/02/2023)   PRAPARE - Administrator, Civil Service (Medical): No    Lack of Transportation (Non-Medical): No  Physical Activity: Sufficiently Active (03/02/2023)   Exercise Vital Sign    Days of Exercise per Week: 4 days    Minutes of Exercise per  Session: 60 min  Stress: Stress Concern Present (03/02/2023)   [employer] of Occupational Health - Occupational Stress Questionnaire    Feeling of Stress : To some extent  Social Connections: Socially Isolated (03/02/2023)   Social Connection and Isolation Panel    Frequency of Communication with Friends and Family: Once a week    Frequency of Social Gatherings with Friends and Family: Once a week    Attends Religious Services: 1 to 4 times per year    Active Member of [employer] or Organizations: No    Attends Banker Meetings: Never    Marital Status: Separated  Intimate Partner Violence: Not At Risk (01/11/2023)   Humiliation, Afraid, Rape, and Kick questionnaire    Fear of Current or Ex-Partner: No    Emotionally Abused: No    Physically Abused: No    Sexually Abused: No    Outpatient Medications Prior to Visit  Medication Sig Dispense Refill   albuterol  (VENTOLIN  HFA) 108 (90 Base) MCG/ACT  inhaler Inhale 2 puffs into the lungs every 6 (six) hours as needed for wheezing or shortness of breath. 8 g 2   allopurinol  (ZYLOPRIM ) 300 MG tablet take 1 tablet once daily. 30 tablet 0   amLODipine -olmesartan  (AZOR ) 10-40 MG tablet Take 1 tablet by mouth daily. 90 tablet 3   busPIRone (BUSPAR) 5 MG tablet Take by mouth.     diclofenac  Sodium (VOLTAREN ) 1 % GEL Apply 2 g topically 4 (four) times daily. 2 g 0   gabapentin (NEURONTIN) 400 MG capsule Take by mouth.     GOODSENSE ASPIRIN  LOW DOSE 81 MG tablet take 1 tablet once daily. 30 tablet 0   hydrochlorothiazide  (HYDRODIURIL ) 12.5 MG tablet Take 1 tablet (12.5 mg total) by mouth daily. 90 tablet 3   HYDROmorphone HCl (EXALGO) 8 MG TB24 Take 8 mg by mouth daily.     hydrOXYzine  (VISTARIL ) 25 MG capsule take 1 capsule (25 MILLIGRAM total) by mouth at bedtime as needed. 30 capsule 0   ofloxacin  (FLOXIN ) 0.3 % OTIC solution Place 5 drops into the left ear twice daily for 7 days 5 mL 1   oxyCODONE-acetaminophen  (PERCOCET) 10-325 MG tablet Take 1 tablet by mouth 4 (four) times daily as needed. (Patient not taking: Reported on 11/17/2023)     rosuvastatin  (CRESTOR ) 10 MG tablet take 1 tablet (10 milligram total) by mouth at bedtime. 90 tablet 0   tadalafil  (CIALIS ) 20 MG tablet take ONE-HALF (1/2) to 1 tablet every other day as needed for erectile dysfunction 15 tablet 0   tamsulosin  (FLOMAX ) 0.4 MG CAPS capsule Take 1 capsule (0.4 mg total) by mouth daily. 30 capsule 11   testosterone  cypionate (DEPOTESTOSTERONE CYPIONATE) 200 MG/ML injection Inject 0.5 mLs (100 mg total) into the muscle every 7 (seven) days. 4 mL 5   Vitamin D , Ergocalciferol , (DRISDOL ) 1.25 MG (50000 UNIT) CAPS capsule Take 1 capsule (50,000 Units total) by mouth every 7 (seven) days. 20 capsule 1   No facility-administered medications prior to visit.    Allergies  Allergen Reactions   Morphine And Codeine Other (See Comments)    abd pain and inability to urinate     ROS Review of Systems  Constitutional:  Negative for fatigue and fever.  Eyes:  Negative for visual disturbance.  Respiratory:  Negative for chest tightness and shortness of breath.   Cardiovascular:  Negative for chest pain and palpitations.  Neurological:  Negative for dizziness and headaches.      Objective:  Physical Exam HENT:     Head: Normocephalic.     Right Ear: External ear normal.     Left Ear: External ear normal.     Nose: No congestion or rhinorrhea.     Mouth/Throat:     Mouth: Mucous membranes are moist.  Cardiovascular:     Rate and Rhythm: Regular rhythm.     Heart sounds: No murmur heard. Pulmonary:     Effort: No respiratory distress.     Breath sounds: Normal breath sounds.  Skin:    Comments: Wart on left forearm  Neurological:     Mental Status: He is alert.     BP (!) 150/78   Pulse 90   Ht 6' (1.829 m)   Wt 222 lb (100.7 kg)   SpO2 95%   BMI 30.11 kg/m  Wt Readings from Last 3 Encounters:  12/18/23 222 lb (100.7 kg)  11/30/23 220 lb 6 oz (100 kg)  11/17/23 221 lb 0.6 oz (100.3 kg)    Lab Results  Component Value Date   TSH 1.520 10/09/2023   Lab Results  Component Value Date   WBC 12.0 (H) 10/09/2023   HGB 15.8 11/21/2023   HCT 47.0 11/21/2023   MCV 93 10/09/2023   PLT 294 10/09/2023   Lab Results  Component Value Date   NA 140 10/09/2023   K 4.1 10/09/2023   CO2 20 10/09/2023   GLUCOSE 90 10/09/2023   BUN 23 10/09/2023   CREATININE 1.12 10/09/2023   BILITOT 0.3 10/09/2023   ALKPHOS 79 10/09/2023   AST 23 10/09/2023   ALT 30 10/09/2023   PROT 7.1 10/09/2023   ALBUMIN 4.8 10/09/2023   CALCIUM  9.9 10/09/2023   ANIONGAP 7 10/20/2017   EGFR 72 10/09/2023   Lab Results  Component Value Date   CHOL 102 10/09/2023   Lab Results  Component Value Date   HDL 33 (L) 10/09/2023   Lab Results  Component Value Date   LDLCALC 30 10/09/2023   Lab Results  Component Value Date   TRIG 259 (H) 10/09/2023   Lab  Results  Component Value Date   CHOLHDL 3.1 10/09/2023   Lab Results  Component Value Date   HGBA1C 6.0 (H) 10/09/2023      Assessment & Plan:  Essential hypertension Assessment & Plan: Patient reports taking amlodipine -olmesartan  10/40 mg with hydrochlorothiazide  12.5 mg. -He states he has been taking  tablet of HCTZ 12.5 mg due to previously low diastolic blood pressure readings. -Due to consistently elevated BP readings, a referral to cardiology / advanced hypertension clinic has been placed for collaborative management. -Encouraged patient to continue current medication regimen as prescribed. -Reinforced importance of medication adherence and lifestyle modifications, including low-sodium diet and regular physical activity. -Emergency precautions reviewed: patient advised to present to the ED if BP is >=180/120 mmHg and accompanied by symptoms such as chest pain, shortness of breath, severe headache unrelieved by acetaminophen , vision changes, or palpitations. -Discussed long-term risks of uncontrolled hypertension, including cardiovascular disease and stroke. -Patient verbalized understanding.     Viral warts, unspecified type Assessment & Plan: Referral placed to dermatology   Note: This chart has been completed using Engineer, Civil (consulting) software, and while attempts have been made to ensure accuracy, certain words and phrases may not be transcribed as intended.    Follow-up: No follow-ups on file.   Severus Brodzinski  Z Bacchus, FNP

## 2023-12-20 ENCOUNTER — Telehealth: Payer: Self-pay | Admitting: Family Medicine

## 2023-12-20 NOTE — Telephone Encounter (Signed)
 Copied from CRM [id number]. Topic: Referral - Question >> Dec 20, 2023  1:55 PM Selinda RAMAN wrote: Reason for CRM: Curlee a representative with the patients insurance [employer] said a recent referral was sent to Reche Finder NP with [facility] on [facility] and she is not in network with the insurance. He would like a call to discuss this further. Please assist further by contacting [phone]

## 2023-12-25 NOTE — Telephone Encounter (Signed)
 I have returned call at # listed and have not received a call back from Patient's insurance at this time.

## 2024-01-01 ENCOUNTER — Ambulatory Visit: Payer: Self-pay

## 2024-01-01 NOTE — Telephone Encounter (Signed)
 FYI Only or Action Required?: FYI only for provider: appointment scheduled on 01/03/2024.  Patient was last seen in primary care on 12/18/2023 by Edman Meade PEDLAR, FNP.  Called Nurse Triage reporting Cough, Nasal Congestion, and Generalized Body Aches.  Symptoms began a week ago.  Interventions attempted: OTC medications: tylenol  cold and flu, mucinex, ny quil.  Symptoms are: unchanged.  Triage Disposition: See Physician Within 24 Hours  Patient/caregiver understands and will follow disposition?: Yes  Copied from CRM #[id number]. Topic: Clinical - Red Word Triage >> Jan 01, 2024 12:48 PM Tinnie BROCKS wrote: Red Word that prompted transfer to Nurse Triage: Wants virtual appt for flu-like symptoms. Says it started with a head cold and now he is coughing up Calia phlegm. Reason for Disposition  Fever present > 3 days (72 hours)  Answer Assessment - Initial Assessment Questions 1. SYMPTOMS: What is your main symptom or concern? (e.g., cough, fever, shortness of breath, muscle aches)     Cough, body aches, congestion 2. ONSET: When did the symptoms start?      One week ago 3. COUGH: Do you have a cough? If Yes, ask: How bad is the cough?       moderate 4. FEVER: Do you have a fever? If Yes, ask: What is your temperature, how was it measured, and when did it start?     No measured fever, but patient is having chills 5. BREATHING DIFFICULTY: Are you having any difficulty breathing? (e.g., normal; shortness of breath, wheezing, unable to speak)      Denies SOB 6. BETTER-SAME-WORSE: Are you getting better, staying the same or getting worse compared to yesterday?  If getting worse, ask, In what way?     worse 7. OTHER SYMPTOMS: Do you have any other symptoms?  (e.g., chills, fatigue, headache, loss of smell or taste, muscle pain, sore throat)     Chills, fatigue 8. INFLUENZA EXPOSURE: Was there any known exposure to influenza (flu) before the symptoms began?     Was expose  to sick child about one week ago 9. INFLUENZA SUSPECTED: Why do you think you have influenza? (e.g., positive flu self-test at home, symptoms after exposure).     symptoms  Protocols used: Influenza (Flu) Suspected-A-AH

## 2024-01-01 NOTE — Telephone Encounter (Signed)
Noted patient scheduled

## 2024-01-03 ENCOUNTER — Telehealth (INDEPENDENT_AMBULATORY_CARE_PROVIDER_SITE_OTHER): Payer: Self-pay | Admitting: Internal Medicine

## 2024-01-03 ENCOUNTER — Encounter: Payer: Self-pay | Admitting: Internal Medicine

## 2024-01-03 DIAGNOSIS — J069 Acute upper respiratory infection, unspecified: Secondary | ICD-10-CM

## 2024-01-03 MED ORDER — AMOXICILLIN-POT CLAVULANATE 875-125 MG PO TABS: 1.0000 | ORAL_TABLET | Freq: Two times a day (BID) | ORAL | 0 refills | Status: DC

## 2024-01-03 MED ORDER — FLUTICASONE PROPIONATE 50 MCG/ACT NA SUSP: 2.0000 | Freq: Every day | NASAL | 0 refills | Status: AC

## 2024-01-03 NOTE — Assessment & Plan Note (Addendum)
 Started empiric Augmentin  as he has persistent symptoms despite symptomatic treatment Flonase for nasal congestion/allergies Advised to use vaporizer and/or sinus inhaler as needed for nasal congestion Mucinex as needed for cough Needs to avoid smoking

## 2024-01-03 NOTE — Patient Instructions (Signed)
 Please start taking Augmentin  as prescribed.  Please use Flonase  for nasal congestion/allergies.  Please use Mucinex as needed for cough.  Please perform warm water gargling for sore throat.  Please avoid smoking.

## 2024-01-03 NOTE — Progress Notes (Signed)
 Virtual Visit via Video Note   Because of Stephen Fuller's co-morbid illnesses, he is at least at moderate risk for complications without adequate follow up.  This format is felt to be most appropriate for this patient at this time.  All issues noted in this document were discussed and addressed.  A limited physical exam was performed with this format.      Evaluation Performed:  Follow-up visit  Date:  01/03/2024   ID:  Stephen Fuller, Stephen Fuller 12-06-1955, MRN [id number]  Patient Location: Home Provider Location: Office/Clinic  Participants: Patient Location of Patient: Home Location of Provider: Telehealth Consent was obtain for visit to be over via telehealth. I verified that I am speaking with the correct person using two identifiers.  PCP:  Edman Meade PEDLAR, FNP   Chief Complaint: Nasal congestion and cough  History of Present Illness:    Stephen Fuller is a 68 y.o. male who has a video visit for complaint of nasal congestion, sinus pressure related headache, postnasal drip, sore throat and cough with clear expectoration for the last 1 week.  She also reports intermittent fever.  Denies any recent worsening of dyspnea or wheezing.  He does admit to smoking about 0.5 pack/day.  The patient does not have symptoms concerning for COVID-19 infection (fever, chills, cough, or new shortness of breath).   Past Medical, Surgical, Social History, Allergies, and Medications have been Reviewed.  Past Medical History:  Diagnosis Date   Anxiety 2022   Arthritis    Chest pain    Hepatitis C virus infection cured after antiviral drug therapy    Hypertension    Palpitations    Past Surgical History:  Procedure Laterality Date   CHOLECYSTECTOMY  2005   COLONOSCOPY  2019   LIVER BIOPSY  2019     No outpatient medications have been marked as taking for the 01/03/24 encounter (Telemedicine) with Tobie Suzzane POUR, MD.     Allergies:   Morphine and codeine   ROS:   Please see  the history of present illness. All other systems reviewed and are negative.   Labs/Other Tests and Data Reviewed:    Recent Labs: 10/09/2023: ALT 30; BUN 23; Creatinine, Ser 1.12; Platelets 294; Potassium 4.1; Sodium 140; TSH 1.520 11/21/2023: Hemoglobin 15.8   Recent Lipid Panel Lab Results  Component Value Date/Time   CHOL 102 10/09/2023 03:07 PM   CHOL 263 (H) 08/28/2012 03:58 PM   TRIG 259 (H) 10/09/2023 03:07 PM   TRIG 406 (H) 08/28/2012 03:58 PM   HDL 33 (L) 10/09/2023 03:07 PM   HDL 68 08/28/2012 03:58 PM   CHOLHDL 3.1 10/09/2023 03:07 PM   LDLCALC 30 10/09/2023 03:07 PM   LDLCALC NOT CALC 08/28/2012 03:58 PM    Wt Readings from Last 3 Encounters:  12/18/23 222 lb (100.7 kg)  11/30/23 220 lb 6 oz (100 kg)  11/17/23 221 lb 0.6 oz (100.3 kg)     Objective:    Vital Signs:  There were no vitals taken for this visit.   VITAL SIGNS:  reviewed GEN:  no acute distress EYES:  sclerae anicteric, EOMI - Extraocular Movements Intact RESPIRATORY:  normal respiratory effort, symmetric expansion NEURO:  alert and oriented x 3, no obvious focal deficit PSYCH:  normal affect  ASSESSMENT & PLAN:    Assessment & Plan URTI (acute upper respiratory infection) Started empiric Augmentin  as he has persistent symptoms despite symptomatic treatment Flonase for nasal congestion/allergies Advised to use vaporizer  and/or sinus inhaler as needed for nasal congestion Mucinex as needed for cough Needs to avoid smoking    I discussed the assessment and treatment plan with the patient. The patient was provided an opportunity to ask questions, and all were answered. The patient agreed with the plan and demonstrated an understanding of the instructions.   The patient was advised to call back or seek an in-person evaluation if the symptoms worsen or if the condition fails to improve as anticipated.  The above assessment and management plan was discussed with the patient. The patient  verbalized understanding of and has agreed to the management plan.   Medication Adjustments/Labs and Tests Ordered: Current medicines are reviewed at length with the patient today.  Concerns regarding medicines are outlined above.   Tests Ordered: No orders of the defined types were placed in this encounter.   Medication Changes: No orders of the defined types were placed in this encounter.    Note: This dictation was prepared with Dragon dictation along with smaller phrase technology. Similar sounding words can be transcribed inadequately or may not be corrected upon review. Any transcriptional errors that result from this process are unintentional.      Disposition:  Follow up  Signed, Suzzane MARLA Blanch, MD  01/03/2024 2:29 PM     [facility]

## 2024-01-04 ENCOUNTER — Other Ambulatory Visit: Payer: Self-pay | Admitting: Family Medicine

## 2024-01-04 DIAGNOSIS — M10071 Idiopathic gout, right ankle and foot: Secondary | ICD-10-CM

## 2024-01-04 DIAGNOSIS — G479 Sleep disorder, unspecified: Secondary | ICD-10-CM

## 2024-01-04 DIAGNOSIS — R399 Unspecified symptoms and signs involving the genitourinary system: Secondary | ICD-10-CM

## 2024-01-07 ENCOUNTER — Other Ambulatory Visit: Payer: Self-pay | Admitting: Family Medicine

## 2024-01-07 DIAGNOSIS — E559 Vitamin D deficiency, unspecified: Secondary | ICD-10-CM

## 2024-01-17 ENCOUNTER — Ambulatory Visit: Payer: Medicare HMO

## 2024-01-17 VITALS — BP 156/67 | Ht 72.0 in | Wt 215.0 lb

## 2024-01-17 DIAGNOSIS — Z0000 Encounter for general adult medical examination without abnormal findings: Secondary | ICD-10-CM

## 2024-01-17 NOTE — Progress Notes (Signed)
 I connected with  Stephen Fuller on 01/17/24 by a audio enabled telemedicine application and verified that I am speaking with the correct person using two identifiers.  Patient Location: Home  Provider Location: Home Office  Persons Participating in Visit: Patient.  I discussed the limitations of evaluation and management by telemedicine. The patient expressed understanding and agreed to proceed.  Vital Signs: Because this visit was a virtual/telehealth visit, some criteria may be missing or patient reported. Any vitals not documented were not able to be obtained and vitals that have been documented are patient reported.   Because this visit was a virtual/telehealth visit,  certain criteria was not obtained, such a blood pressure, CBG if applicable, and timed get up and go. Any medications not marked as taking were not mentioned during the medication reconciliation part of the visit. Any vitals not documented were not able to be obtained due to this being a telehealth visit or patient was unable to self-report a recent blood pressure reading due to a lack of equipment at home via telehealth. Vitals that have been documented are verbally provided by the patient.   This visit was performed by a medical professional under my direct supervision. I was immediately available for consultation/collaboration. I have reviewed and agree with the Annual Wellness Visit documentation.  Chief Complaint  Patient presents with   Medicare Wellness     Subjective:   Stephen Fuller is a 68 y.o. male who presents for a Medicare Annual Wellness Visit.  Visit info / Clinical Intake: Medicare Wellness Visit Type:: Subsequent Annual Wellness Visit Persons participating in visit and providing information:: patient Medicare Wellness Visit Mode:: Telephone If telephone:: video declined Since this visit was completed virtually, some vitals may be partially provided or unavailable. Missing vitals are due to the  limitations of the virtual format.: Documented vitals are patient reported If Telephone or Video please confirm:: I connected with patient using audio/video enable telemedicine. I verified patient identity with two identifiers, discussed telehealth limitations, and patient agreed to proceed. Patient Location:: home Provider Location:: home office Interpreter Needed?: No Pre-visit prep was completed: yes AWV questionnaire completed by patient prior to visit?: yes Date:: 01/16/24 Living arrangements:: (!) (Patient-Rptd) lives alone Patient's Overall Health Status Rating: (Patient-Rptd) very good Typical amount of pain: some Does pain affect daily life?: no Are you currently prescribed opioids?: (!) yes  Dietary Habits and Nutritional Risks How many meals a day?: (Patient-Rptd) 3 Eats fruit and vegetables daily?: (Patient-Rptd) yes Most meals are obtained by: (Patient-Rptd) eating out; having others provide food In the last 2 weeks, have you had any of the following?: none Diabetic:: no  Functional Status Activities of Daily Living (to include ambulation/medication): (Patient-Rptd) Independent Ambulation: (Patient-Rptd) Independent Medication Administration: (Patient-Rptd) Independent Home Management (perform basic housework or laundry): (Patient-Rptd) Independent Manage your own finances?: (Patient-Rptd) yes Primary transportation is: (Patient-Rptd) driving Concerns about vision?: no *vision screening is required for WTM* Concerns about hearing?: no  Fall Screening Falls in the past year?: (Patient-Rptd) 0 Number of falls in past year: 0 Was there an injury with Fall?: 0 Fall Risk Category Calculator: 0 Patient Fall Risk Level: Low Fall Risk  Fall Risk Patient at Risk for Falls Due to: No Fall Risks Fall risk Follow up: Falls evaluation completed; Education provided; Falls prevention discussed  Home and Transportation Safety: All rugs have non-skid backing?: (Patient-Rptd)  N/A, no rugs All stairs or steps have railings?: (Patient-Rptd) yes Grab bars in the bathtub or shower?: (!) (Patient-Rptd)  no Have non-skid surface in bathtub or shower?: (Patient-Rptd) yes Good home lighting?: (Patient-Rptd) yes Regular seat belt use?: (Patient-Rptd) yes Hospital stays in the last year:: (Patient-Rptd) no  Cognitive Assessment Difficulty concentrating, remembering, or making decisions? : (Patient-Rptd) no Will 6CIT or Mini Cog be Completed: yes What year is it?: 0 points What month is it?: 0 points Give patient an address phrase to remember (5 components): [address] [place] About what time is it?: 0 points Count backwards from 20 to 1: 0 points Say the months of the year in reverse: 0 points Repeat the address phrase from earlier: 0 points 6 CIT Score: 0 points  Advance Directives (For Healthcare) Does Patient Have a Medical Advance Directive?: No Would patient like information on creating a medical advance directive?: No - Patient declined  Reviewed/Updated  Reviewed/Updated: Reviewed All (Medical, Surgical, Family, Medications, Allergies, Care Teams, Patient Goals)    Allergies (verified) Morphine and codeine   Current Medications (verified) Outpatient Encounter Medications as of 01/17/2024  Medication Sig   albuterol  (VENTOLIN  HFA) 108 (90 Base) MCG/ACT inhaler Inhale 2 puffs into the lungs every 6 (six) hours as needed for wheezing or shortness of breath.   allopurinol  (ZYLOPRIM ) 300 MG tablet take 1 tablet once daily.   amLODipine -olmesartan  (AZOR ) 10-40 MG tablet Take 1 tablet by mouth daily.   amoxicillin -clavulanate (AUGMENTIN ) 875-125 MG tablet Take 1 tablet by mouth 2 (two) times daily.   busPIRone (BUSPAR) 5 MG tablet Take by mouth.   diclofenac  Sodium (VOLTAREN ) 1 % GEL Apply 2 g topically 4 (four) times daily.   fluticasone  (FLONASE ) 50 MCG/ACT nasal spray Place 2 sprays into both nostrils daily.   gabapentin (NEURONTIN) 400 MG capsule  Take by mouth.   GOODSENSE ASPIRIN  LOW DOSE 81 MG tablet take 1 tablet once daily.   hydrochlorothiazide  (HYDRODIURIL ) 12.5 MG tablet Take 1 tablet (12.5 mg total) by mouth daily.   HYDROmorphone HCl (EXALGO) 8 MG TB24 Take 8 mg by mouth daily.   hydrOXYzine  (VISTARIL ) 25 MG capsule take 1 capsule (25 milligram total) by mouth at bedtime as needed.   ofloxacin  (FLOXIN ) 0.3 % OTIC solution Place 5 drops into the left ear twice daily for 7 days   oxyCODONE-acetaminophen  (PERCOCET) 10-325 MG tablet Take 1 tablet by mouth 4 (four) times daily as needed.   rosuvastatin  (CRESTOR ) 10 MG tablet take 1 tablet (10 milligram total) by mouth at bedtime.   tadalafil  (CIALIS ) 20 MG tablet take ONE-HALF (1/2) to 1 tablet every other day as needed for erectile dysfunction   tamsulosin  (FLOMAX ) 0.4 MG CAPS capsule take 1 capsule (0.4 MILLIGRAM total) by mouth daily.   testosterone  cypionate (DEPOTESTOSTERONE CYPIONATE) 200 MG/ML injection Inject 0.5 mLs (100 mg total) into the muscle every 7 (seven) days.   Vitamin D , Ergocalciferol , (DRISDOL ) 1.25 MG (50000 UNIT) CAPS capsule take 1 capsule (50,000 units total) by mouth every 7 (seven) days.   No facility-administered encounter medications on file as of 01/17/2024.    History: Past Medical History:  Diagnosis Date   Anxiety 2022   Arthritis    Chest pain    Hepatitis C virus infection cured after antiviral drug therapy    Hypertension    Palpitations    Past Surgical History:  Procedure Laterality Date   CHOLECYSTECTOMY  2005   COLONOSCOPY  2019   LIVER BIOPSY  2019   Family History  Problem Relation Age of Onset   Diabetes Mother    Stroke Mother  Hypertension Mother    Heart disease Other        No family history   Diabetes Brother    Cirrhosis Brother    Colon cancer Neg Hx    Social History   Occupational History   Occupation: UNEMPLOYED  Tobacco Use   Smoking status: Every Day    Current packs/day: 0.50    Average packs/day:  0.5 packs/day for 37.0 years (18.5 ttl pk-yrs)    Types: Cigarettes   Smokeless tobacco: Never   Tobacco comments:    1/2 a pack a day  Vaping Use   Vaping status: Never Used  Substance and Sexual Activity   Alcohol use: Not Currently    Alcohol/week: 2.0 standard drinks of alcohol    Types: 2 Standard drinks or equivalent per week    Comment: Occasional   Drug use: No   Sexual activity: Not on file   Tobacco Counseling Ready to quit: Not Answered Counseling given: Not Answered Tobacco comments: 1/2 a pack a day  SDOH Screenings   Food Insecurity: No Food Insecurity (01/16/2024)  Housing: Low Risk  (01/16/2024)  Transportation Needs: No Transportation Needs (01/16/2024)  Utilities: Not At Risk (01/17/2024)  Alcohol Screen: Low Risk  (01/08/2023)  Depression (PHQ2-9): Low Risk  (01/17/2024)  Financial Resource Strain: Low Risk  (01/16/2024)  Physical Activity: Sufficiently Active (01/16/2024)  Social Connections: Moderately Integrated (01/16/2024)  Stress: No Stress Concern Present (01/16/2024)  Tobacco Use: High Risk (01/17/2024)  Health Literacy: Adequate Health Literacy (01/17/2024)   See flowsheets for full screening details  Depression Screen PHQ 2 & 9 Depression Scale- Over the past 2 weeks, how often have you been bothered by any of the following problems? Little interest or pleasure in doing things: 2 Feeling down, depressed, or hopeless (PHQ Adolescent also includes...irritable): 0 PHQ-2 Total Score: 2 Trouble falling or staying asleep, or sleeping too much: 0 Feeling tired or having little energy: 0 Poor appetite or overeating (PHQ Adolescent also includes...weight loss): 0 Feeling bad about yourself - or that you are a failure or have let yourself or your family down: 0 Trouble concentrating on things, such as reading the newspaper or watching television (PHQ Adolescent also includes...like school work): 0 Moving or speaking so slowly that other people could have  noticed. Or the opposite - being so fidgety or restless that you have been moving around a lot more than usual: 0 Thoughts that you would be better off dead, or of hurting yourself in some way: 0 PHQ-9 Total Score: 2 If you checked off any problems, how difficult have these problems made it for you to do your work, take care of things at home, or get along with other people?: Not difficult at all  Depression Treatment Depression Interventions/Treatment : EYV7-0 Score <4 Follow-up Not Indicated     Goals Addressed             This Visit's Progress    Patient Stated       To quit smoking             Objective:    Today's Vitals   01/17/24 1308  BP: (!) 156/67  Weight: 215 lb (97.5 kg)  Height: 6' (1.829 m)   Body mass index is 29.16 kg/m.  Hearing/Vision screen Hearing Screening - Comments:: Wears hearing aids  Vision Screening - Comments:: Wears glasses Immunizations and Health Maintenance Health Maintenance  Topic Date Due   COVID-19 Vaccine (1) Never done   Influenza Vaccine  05/14/2024 (Originally 09/15/2023)   Medicare Annual Wellness (AWV)  01/16/2025   Colonoscopy  06/23/2026   DTaP/Tdap/Td (2 - Td or Tdap) 07/27/2031   Pneumococcal Vaccine: 50+ Years  Completed   Hepatitis C Screening  Completed   Zoster Vaccines- Shingrix  Completed   Meningococcal B Vaccine  Aged Out        Assessment/Plan:  This is a routine wellness examination for [place].  Patient Care Team: Bacchus, Meade PEDLAR, FNP as PCP - General (Family Medicine) Charls Pearla LABOR, MD (Inactive) as Attending Physician (Cardiology) [facility] , Roni Watt Rush, MD as Attending Physician (Urology) Claudene Sid BIRCH, NP as Nurse Practitioner (Gastroenterology)  I have personally reviewed and noted the following in the patient's chart:   Medical and social history Use of alcohol, tobacco or illicit drugs  Current medications and supplements including opioid  prescriptions. Functional ability and status Nutritional status Physical activity Advanced directives List of other physicians Hospitalizations, surgeries, and ER visits in previous 12 months Vitals Screenings to include cognitive, depression, and falls Referrals and appointments  No orders of the defined types were placed in this encounter.  In addition, I have reviewed and discussed with patient certain preventive protocols, quality metrics, and best practice recommendations. A written personalized care plan for preventive services as well as general preventive health recommendations were provided to patient.   Lyle MARLA Right, CMA   01/17/2024   Return in 1 year (on 01/16/2025).  After Visit Summary: (MyChart) Due to this being a telephonic visit, the after visit summary with patients personalized plan was offered to patient via MyChart   Nurse Notes: Nothing to report

## 2024-01-17 NOTE — Patient Instructions (Signed)
 Stephen Fuller,  Thank you for taking the time for your Medicare Wellness Visit. I appreciate your continued commitment to your health goals. Please review the care plan we discussed, and feel free to reach out if I can assist you further.  Please note that Annual Wellness Visits do not include a physical exam. Some assessments may be limited, especially if the visit was conducted virtually. If needed, we may recommend an in-person follow-up with your provider.  Ongoing Care Seeing your primary care provider every 3 to 6 months helps us  monitor your health and provide consistent, personalized care.   Referrals If a referral was made during today's visit and you haven't received any updates within two weeks, please contact the referred provider directly to check on the status.  Recommended Screenings:  Health Maintenance  Topic Date Due   COVID-19 Vaccine (1) Never done   Medicare Annual Wellness Visit  01/11/2024   Flu Shot  05/14/2024*   Colon Cancer Screening  06/23/2026   DTaP/Tdap/Td vaccine (2 - Td or Tdap) 07/27/2031   Pneumococcal Vaccine for age over 51  Completed   Hepatitis C Screening  Completed   Zoster (Shingles) Vaccine  Completed   Meningitis B Vaccine  Aged Out  *Topic was postponed. The date shown is not the original due date.       01/16/2024    4:29 PM  Advanced Directives  Does Patient Have a Medical Advance Directive? No  Would patient like information on creating a medical advance directive? No - Patient declined    Vision: Annual vision screenings are recommended for early detection of glaucoma, cataracts, and diabetic retinopathy. These exams can also reveal signs of chronic conditions such as diabetes and high blood pressure.  Dental: Annual dental screenings help detect early signs of oral cancer, gum disease, and other conditions linked to overall health, including heart disease and diabetes.  Please see the attached documents for additional preventive  care recommendations.

## 2024-01-31 ENCOUNTER — Other Ambulatory Visit: Payer: Self-pay | Admitting: Family Medicine

## 2024-01-31 DIAGNOSIS — R399 Unspecified symptoms and signs involving the genitourinary system: Secondary | ICD-10-CM

## 2024-01-31 DIAGNOSIS — M10071 Idiopathic gout, right ankle and foot: Secondary | ICD-10-CM

## 2024-01-31 DIAGNOSIS — G479 Sleep disorder, unspecified: Secondary | ICD-10-CM

## 2024-02-12 ENCOUNTER — Telehealth (INDEPENDENT_AMBULATORY_CARE_PROVIDER_SITE_OTHER): Admitting: Family Medicine

## 2024-02-12 DIAGNOSIS — R7301 Impaired fasting glucose: Secondary | ICD-10-CM

## 2024-02-12 DIAGNOSIS — I10 Essential (primary) hypertension: Secondary | ICD-10-CM | POA: Diagnosis not present

## 2024-02-12 DIAGNOSIS — E559 Vitamin D deficiency, unspecified: Secondary | ICD-10-CM

## 2024-02-12 DIAGNOSIS — E038 Other specified hypothyroidism: Secondary | ICD-10-CM | POA: Diagnosis not present

## 2024-02-12 DIAGNOSIS — E782 Mixed hyperlipidemia: Secondary | ICD-10-CM

## 2024-02-12 DIAGNOSIS — R399 Unspecified symptoms and signs involving the genitourinary system: Secondary | ICD-10-CM

## 2024-02-12 MED ORDER — TAMSULOSIN HCL 0.4 MG PO CAPS: 0.4000 mg | ORAL_CAPSULE | Freq: Every day | ORAL | 1 refills | Status: AC

## 2024-02-12 NOTE — Assessment & Plan Note (Signed)
 Refills sent to the pharmacy.

## 2024-02-12 NOTE — Progress Notes (Signed)
 "  Virtual Visit via Video Note  I connected with Stephen Fuller on 02/12/2024 at  2:20 PM EST by a video enabled telemedicine application and verified that I am speaking with the correct person using two identifiers.  Patient Location: Home Provider Location: Home Office  I discussed the limitations, risks, security, and privacy concerns of performing an evaluation and management service by video and the availability of in person appointments. I also discussed with the patient that there may be a patient responsible charge related to this service. The patient expressed understanding and agreed to proceed.  Subjective: PCP: Edman Meade PEDLAR, FNP  Chief Complaint  Patient presents with   Medical Management of Chronic Issues    Follow up    HPI The patient is seen today for chronic care management. He reports adherence to his treatment regimen and states he is currently asymptomatic. His blood pressure today is 154/68 mmHg. The patient is [facility]. No additional complaints are reported at this time.     ROS: Per HPI Current Medications[1]  Observations/Objective: There were no vitals filed for this visit. Physical Exam Patient is well-developed, well-nourished in no acute distress.  Resting comfortably at home.  Head is normocephalic, atraumatic.  No labored breathing.  Speech is clear and coherent with logical content.  Patient is alert and oriented at baseline.   Assessment and Plan: Essential hypertension Assessment & Plan: Blood pressure is currently uncontrolled. Encouraged to follow up with the referral as scheduled. Continue current treatment regimen as prescribed. Advise low-sodium diet and increased physical activity to help manage blood pressure. Reinforced importance of adherence to lifestyle modifications and medication regimen.   Orders: -     CMP14+EGFR -     CBC with  Differential/Platelet  Lower urinary tract symptoms (LUTS) Assessment & Plan: Refills sent to the pharmacy  Orders: -     Tamsulosin  HCl; Take 1 capsule (0.4 mg total) by mouth daily. take 1 capsule (0.4 milligram total) by mouth daily.  Dispense: 30 capsule; Refill: 1  IFG (impaired fasting glucose) -     Hemoglobin A1c  Vitamin D  deficiency -     VITAMIN D  25 Hydroxy (Vit-D Deficiency, Fractures)  TSH (thyroid -stimulating hormone deficiency) -     TSH + free T4  Mixed hyperlipidemia -     Lipid panel  Note: This chart has been completed using [employer] (consulting) software, and while attempts have been made to ensure accuracy, certain words and phrases may not be transcribed as intended.    Follow Up Instructions: Return in about 5 months (around 07/12/2024).   I discussed the assessment and treatment plan with the patient. The patient was provided an opportunity to ask questions, and all were answered. The patient agreed with the plan and demonstrated an understanding of the instructions.   The patient was advised to call back or seek an in-person evaluation if the symptoms worsen or if the condition fails to improve as anticipated.  The above assessment and management plan was discussed with the patient. The patient verbalized understanding of and has agreed to the management plan.   Meade PEDLAR Edman, FNP    [1]  Current Outpatient Medications:    albuterol  (VENTOLIN  HFA) 108 (90 Base) MCG/ACT inhaler, Inhale 2 puffs into the lungs every 6 (six) hours as needed for wheezing or shortness of breath., Disp: 8 g, Rfl: 2   allopurinol  (ZYLOPRIM ) 300 MG  tablet, take 1 tablet once daily., Disp: 30 tablet, Rfl: 0   amLODipine -olmesartan  (AZOR ) 10-40 MG tablet, Take 1 tablet by mouth daily., Disp: 90 tablet, Rfl: 3   amoxicillin -clavulanate (AUGMENTIN ) 875-125 MG tablet, Take 1 tablet by mouth 2 (two) times daily., Disp: 14 tablet, Rfl: 0   busPIRone (BUSPAR) 5 MG tablet, Take  by mouth., Disp: , Rfl:    diclofenac  Sodium (VOLTAREN ) 1 % GEL, Apply 2 g topically 4 (four) times daily., Disp: 2 g, Rfl: 0   fluticasone  (FLONASE ) 50 MCG/ACT nasal spray, Place 2 sprays into both nostrils daily., Disp: 16 g, Rfl: 0   gabapentin (NEURONTIN) 400 MG capsule, Take by mouth., Disp: , Rfl:    GOODSENSE ASPIRIN  LOW DOSE 81 MG tablet, take 1 tablet once daily., Disp: 30 tablet, Rfl: 0   hydrochlorothiazide  (HYDRODIURIL ) 12.5 MG tablet, Take 1 tablet (12.5 mg total) by mouth daily., Disp: 90 tablet, Rfl: 3   HYDROmorphone HCl (EXALGO) 8 MG TB24, Take 8 mg by mouth daily., Disp: , Rfl:    hydrOXYzine  (VISTARIL ) 25 MG capsule, take 1 capsule (25 milligram total) by mouth at bedtime as needed., Disp: 30 capsule, Rfl: 0   ofloxacin  (FLOXIN ) 0.3 % OTIC solution, Place 5 drops into the left ear twice daily for 7 days, Disp: 5 mL, Rfl: 1   oxyCODONE-acetaminophen  (PERCOCET) 10-325 MG tablet, Take 1 tablet by mouth 4 (four) times daily as needed., Disp: , Rfl:    rosuvastatin  (CRESTOR ) 10 MG tablet, take 1 tablet (10 milligram total) by mouth at bedtime., Disp: 90 tablet, Rfl: 0   tadalafil  (CIALIS ) 20 MG tablet, take ONE-HALF (1/2) to 1 tablet every other day as needed for erectile dysfunction, Disp: 15 tablet, Rfl: 0   tamsulosin  (FLOMAX ) 0.4 MG CAPS capsule, Take 1 capsule (0.4 mg total) by mouth daily. take 1 capsule (0.4 milligram total) by mouth daily., Disp: 30 capsule, Rfl: 1   testosterone  cypionate (DEPOTESTOSTERONE CYPIONATE) 200 MG/ML injection, Inject 0.5 mLs (100 mg total) into the muscle every 7 (seven) days., Disp: 4 mL, Rfl: 5   Vitamin D , Ergocalciferol , (DRISDOL ) 1.25 MG (50000 UNIT) CAPS capsule, take 1 capsule (50,000 units total) by mouth every 7 (seven) days., Disp: 20 capsule, Rfl: 0  "

## 2024-02-12 NOTE — Assessment & Plan Note (Signed)
 Blood pressure is currently uncontrolled. Encouraged to follow up with the referral as scheduled. Continue current treatment regimen as prescribed. Advise low-sodium diet and increased physical activity to help manage blood pressure. Reinforced importance of adherence to lifestyle modifications and medication regimen.

## 2024-02-23 LAB — CBC WITH DIFFERENTIAL/PLATELET
Basophils Absolute: 0.1 x10E3/uL (ref 0.0–0.2)
Basos: 1 %
EOS (ABSOLUTE): 0.5 x10E3/uL — ABNORMAL HIGH (ref 0.0–0.4)
Eos: 5 %
Hematocrit: 49.4 % (ref 37.5–51.0)
Hemoglobin: 16.8 g/dL (ref 13.0–17.7)
Immature Grans (Abs): 0 x10E3/uL (ref 0.0–0.1)
Immature Granulocytes: 0 %
Lymphocytes Absolute: 4.7 x10E3/uL — ABNORMAL HIGH (ref 0.7–3.1)
Lymphs: 46 %
MCH: 30.9 pg (ref 26.6–33.0)
MCHC: 34 g/dL (ref 31.5–35.7)
MCV: 91 fL (ref 79–97)
Monocytes Absolute: 0.7 x10E3/uL (ref 0.1–0.9)
Monocytes: 7 %
Neutrophils Absolute: 4.2 x10E3/uL (ref 1.4–7.0)
Neutrophils: 41 %
Platelets: 233 x10E3/uL (ref 150–450)
RBC: 5.44 x10E6/uL (ref 4.14–5.80)
RDW: 13.4 % (ref 11.6–15.4)
WBC: 10.1 x10E3/uL (ref 3.4–10.8)

## 2024-02-23 LAB — CMP14+EGFR
ALT: 35 IU/L (ref 0–44)
AST: 30 IU/L (ref 0–40)
Albumin: 4.7 g/dL (ref 3.9–4.9)
Alkaline Phosphatase: 77 IU/L (ref 47–123)
BUN/Creatinine Ratio: 16 (ref 10–24)
BUN: 20 mg/dL (ref 8–27)
Bilirubin Total: 0.5 mg/dL (ref 0.0–1.2)
CO2: 25 mmol/L (ref 20–29)
Calcium: 9.7 mg/dL (ref 8.6–10.2)
Chloride: 102 mmol/L (ref 96–106)
Creatinine, Ser: 1.22 mg/dL (ref 0.76–1.27)
Globulin, Total: 2.5 g/dL (ref 1.5–4.5)
Glucose: 116 mg/dL — ABNORMAL HIGH (ref 70–99)
Potassium: 4.5 mmol/L (ref 3.5–5.2)
Sodium: 140 mmol/L (ref 134–144)
Total Protein: 7.2 g/dL (ref 6.0–8.5)
eGFR: 65 mL/min/1.73

## 2024-02-23 LAB — HEMOGLOBIN A1C
Est. average glucose Bld gHb Est-mCnc: 128 mg/dL
Hgb A1c MFr Bld: 6.1 % — ABNORMAL HIGH (ref 4.8–5.6)

## 2024-02-23 LAB — VITAMIN D 25 HYDROXY (VIT D DEFICIENCY, FRACTURES): Vit D, 25-Hydroxy: 52.2 ng/mL (ref 30.0–100.0)

## 2024-02-23 LAB — LIPID PANEL
Chol/HDL Ratio: 2.4 ratio (ref 0.0–5.0)
Cholesterol, Total: 100 mg/dL (ref 100–199)
HDL: 41 mg/dL
LDL Chol Calc (NIH): 37 mg/dL (ref 0–99)
Triglycerides: 126 mg/dL (ref 0–149)
VLDL Cholesterol Cal: 22 mg/dL (ref 5–40)

## 2024-02-23 LAB — TSH+FREE T4
Free T4: 1.28 ng/dL (ref 0.82–1.77)
TSH: 1.58 u[IU]/mL (ref 0.450–4.500)

## 2024-02-24 ENCOUNTER — Ambulatory Visit: Payer: Self-pay | Admitting: Family Medicine

## 2024-02-28 ENCOUNTER — Other Ambulatory Visit: Payer: Self-pay | Admitting: Family Medicine

## 2024-02-28 DIAGNOSIS — G479 Sleep disorder, unspecified: Secondary | ICD-10-CM

## 2024-02-28 DIAGNOSIS — M10071 Idiopathic gout, right ankle and foot: Secondary | ICD-10-CM

## 2024-03-07 ENCOUNTER — Encounter (HOSPITAL_BASED_OUTPATIENT_CLINIC_OR_DEPARTMENT_OTHER): Payer: Self-pay | Admitting: Family

## 2024-03-07 ENCOUNTER — Ambulatory Visit (HOSPITAL_BASED_OUTPATIENT_CLINIC_OR_DEPARTMENT_OTHER): Admitting: Family

## 2024-03-07 VITALS — BP 190/78 | HR 89 | Ht 72.0 in | Wt 221.6 lb

## 2024-03-07 DIAGNOSIS — Z720 Tobacco use: Secondary | ICD-10-CM | POA: Diagnosis not present

## 2024-03-07 DIAGNOSIS — G4733 Obstructive sleep apnea (adult) (pediatric): Secondary | ICD-10-CM | POA: Diagnosis not present

## 2024-03-07 DIAGNOSIS — I10 Essential (primary) hypertension: Secondary | ICD-10-CM | POA: Diagnosis not present

## 2024-03-07 MED ORDER — HYDROCHLOROTHIAZIDE 12.5 MG PO TABS: 6.2500 mg | ORAL_TABLET | Freq: Every day | ORAL | 3 refills | Status: AC

## 2024-03-07 MED ORDER — HYDRALAZINE HCL 25 MG PO TABS: 25.0000 mg | ORAL_TABLET | Freq: Two times a day (BID) | ORAL | 2 refills | Status: AC

## 2024-03-07 NOTE — Progress Notes (Signed)
 "  [facility] Initial Assessment:    Date:  03/07/2024   ID:  Stephen, Fuller 02/01/1956, MRN [id number]  PCP:  Edman Meade PEDLAR, FNP  Cardiologist:  None  Nephrologist:  Referring MD: Edman Meade PEDLAR, FNP   CC: Hypertension  History of Present Illness:    Stephen Fuller is a 69 y.o. male with a hx of HTN, HLD, OSA, prediabetes here to establish care in the [facility].   Stephen Fuller was diagnosed with hypertension 15 years ago. It has been difficult to control as reports his diastolic blood pressure will drop too low and make him feel poorly. Blood pressure checked with arm cuff at home. Readings have been 150s/60s. He trialed a whole tablet of hydrochlorothiazide  but did not tolerate due to diastolic readings in the 40s.   He reports tobacco use with 0.5 PPD and is motivated to gradually reduce to quit. Alcohol use never. He drinks two 16 oz cups of coffee per day. He reports wearing CPAP for one month and did not tolerate with either full face mask or nose mask   For exercise he works out at [employer] 5 times per week. he eats at home 80% of the time and outside of the home and does follow low sodium diet.   Previous antihypertensives: Toprol  Isosorbide  dinitrate - no reported intolerances  Past Medical History:  Diagnosis Date   Anxiety 2022   Arthritis    Chest pain    Hepatitis C virus infection cured after antiviral drug therapy    Hypertension    Palpitations     Past Surgical History:  Procedure Laterality Date   CHOLECYSTECTOMY  2005   COLONOSCOPY  2019   LIVER BIOPSY  2019    Current Medications: Active Medications[1]   Allergies:   Morphine and codeine   Social History   Socioeconomic History   Marital status: Married    Spouse name: Not on file   Number of children: 2   Years of education: Not on file   Highest education level: 7th grade  Occupational History   Occupation: UNEMPLOYED  Tobacco  Use   Smoking status: Every Day    Current packs/day: 0.50    Average packs/day: 0.5 packs/day for 37.0 years (18.5 ttl pk-yrs)    Types: Cigarettes    Passive exposure: Current   Smokeless tobacco: Never   Tobacco comments:    1/2 a pack a day  Vaping Use   Vaping status: Never Used  Substance and Sexual Activity   Alcohol use: Not Currently    Alcohol/week: 2.0 standard drinks of alcohol    Comment: Occasional   Drug use: Yes    Types: Oxycodone   Sexual activity: Yes    Birth control/protection: Condom, None  Other Topics Concern   Not on file  Social History Narrative   Not on file   Social Drivers of Health   Tobacco Use: High Risk (03/07/2024)   Patient History    Smoking Tobacco Use: Every Day    Smokeless Tobacco Use: Never    Passive Exposure: Current  Financial Resource Strain: Low Risk (03/07/2024)   Overall Financial Resource Strain (CARDIA)    Difficulty of Paying Living Expenses: Not hard at all  Food Insecurity: No Food Insecurity (03/07/2024)   Epic    Worried About Radiation Protection Practitioner of Food in the Last Year: Never true    Ran Out of Food in the Last Year: Never true  Transportation Needs: No Transportation Needs (03/07/2024)   Epic    Lack of Transportation (Medical): No    Lack of Transportation (Non-Medical): No  Physical Activity: Sufficiently Active (03/07/2024)   Exercise Vital Sign    Days of Exercise per Week: 5 days    Minutes of Exercise per Session: 60 min  Stress: No Stress Concern Present (03/07/2024)   [employer] of Occupational Health - Occupational Stress Questionnaire    Feeling of Stress: Not at all  Social Connections: Moderately Isolated (03/07/2024)   Social Connection and Isolation Panel    Frequency of Communication with Friends and Family: More than three times a week    Frequency of Social Gatherings with Friends and Family: More than three times a week    Attends Religious Services: 1 to 4 times per year    Active Member of  Clubs or Organizations: No    Attends Banker Meetings: Never    Marital Status: Separated  Depression (PHQ2-9): Low Risk (02/12/2024)   Depression (PHQ2-9)    PHQ-2 Score: 0  Alcohol Screen: Low Risk (03/07/2024)   Alcohol Screen    Last Alcohol Screening Score (AUDIT): 0  Housing: Low Risk (03/07/2024)   Epic    Unable to Pay for Housing in the Last Year: No    Number of Times Moved in the Last Year: 0    Homeless in the Last Year: No  Utilities: Not At Risk (03/07/2024)   Epic    Threatened with loss of utilities: No  Health Literacy: Adequate Health Literacy (03/07/2024)   B1300 Health Literacy    Frequency of need for help with medical instructions: Never     Family History: The patient's family history includes Cirrhosis in his brother; Diabetes in his brother and mother; Heart disease in his sister and another family member; Hypertension in his mother and sister; Stroke in his mother. There is no history of Colon cancer.  ROS:   Please see the history of present illness.     All other systems reviewed and are negative.  EKGs/Labs/Other Studies Reviewed:    EKG Interpretation Date/Time:  Thursday March 07 2024 14:11:00 EST Ventricular Rate:  89 PR Interval:  170 QRS Duration:  82 QT Interval:  338 QTC Calculation: 411 R Axis:   -29  Text Interpretation: Normal sinus rhythm Normal ECG Confirmed by Vannie Mora ([id number]) on 03/07/2024 2:31:09 PM    Recent Labs: 02/22/2024: ALT 35; BUN 20; Creatinine, Ser 1.22; Hemoglobin 16.8; Platelets 233; Potassium 4.5; Sodium 140; TSH 1.580   Recent Lipid Panel    Component Value Date/Time   CHOL 100 02/22/2024 0914   CHOL 263 (H) 08/28/2012 1558   TRIG 126 02/22/2024 0914   TRIG 406 (H) 08/28/2012 1558   HDL 41 02/22/2024 0914   HDL 68 08/28/2012 1558   CHOLHDL 2.4 02/22/2024 0914   LDLCALC 37 02/22/2024 0914   LDLCALC NOT CALC 08/28/2012 1558    Physical Exam:   VS:  BP (!) 190/78 (BP Location: Right Arm,  Patient Position: Sitting, Cuff Size: Large)   Pulse 89   Ht 6' (1.829 m)   Wt 221 lb 9.6 oz (100.5 kg)   SpO2 96%   BMI 30.05 kg/m  , BMI Body mass index is 30.05 kg/m. GENERAL:  Well appearing HEENT: Pupils equal round and reactive, fundi not visualized, oral mucosa unremarkable NECK:  No jugular venous distention, waveform within normal limits, carotid upstroke brisk and symmetric, no bruits, no thyromegaly LYMPHATICS:  No cervical adenopathy LUNGS:  Clear to auscultation bilaterally HEART:  RRR.  PMI not displaced or sustained,S1 and S2 within normal limits, no S3, no S4, no clicks, no rubs,  murmurs ABD:  Flat, positive bowel sounds normal in frequency in pitch, no bruits, no rebound, no guarding, no midline pulsatile mass, no hepatomegaly, no splenomegaly EXT:  2 plus pulses throughout, no edema, no cyanosis no clubbing SKIN:  No rashes no nodules NEURO:  Cranial nerves II through XII grossly intact, motor grossly intact throughout PSYCH:  Cognitively intact, oriented to person place and time   ASSESSMENT/PLAN:    HTN - BP not at goal <130/80. Notes longstanding history of high systolic BP and low diastolic BP has limited medication titration. Continue amlodipine -olmesartan  10-40mg  daily Continue hydrochlorothiazide  6.25mg  daily (previously symptomatic with low diastolic BP in the 40s on higher doses) Start Hydralazine  25mg  BID Labs today: renin-aldo, catecholamines, metanephrines Discussed to monitor BP at home at least 2 hours after medications and sitting for 5-10 minutes.  Advised to reduce caffeine intake.  Management of OSA, as below.   Tobacco use - Smoking cessation encouraged. Recommend utilization of 1800QUITNOW. Motivated to quit.   Severe OSA - 07/2023 AHI 65. Did not tolerate CPAP with 1 month trial despite multiple mask types.  He is not interested in retrial of CPAP.  He is agreeable to consider dental device and will reach out to his dentist.  If his dentist is  unable to do the dental device we will plan to refer to Dr. Vidal Riding.  Screening for Secondary Hypertension:     Relevant Labs/Studies:    Latest Ref Rng & Units 02/22/2024    9:14 AM 10/09/2023    3:07 PM 07/11/2023   10:50 AM  Basic Labs  Sodium 134 - 144 mmol/L 140  140  140   Potassium 3.5 - 5.2 mmol/L 4.5  4.1  4.4   Creatinine 0.76 - 1.27 mg/dL 8.77  8.87  8.90        Latest Ref Rng & Units 02/22/2024    9:14 AM 10/09/2023    3:07 PM  Thyroid    TSH 0.450 - 4.500 uIU/mL 1.580  1.520                  Disposition:    FU with MD/APP/PharmD in 2-3 months    Medication Adjustments/Labs and Tests Ordered: Current medicines are reviewed at length with the patient today.  Concerns regarding medicines are outlined above.  Orders Placed This Encounter  Procedures   EKG 12-Lead   No orders of the defined types were placed in this encounter.    Signed, Reche GORMAN Finder, NP  03/07/2024 2:56 PM    [facility]     [1]  Current Meds  Medication Sig   albuterol  (VENTOLIN  HFA) 108 (90 Base) MCG/ACT inhaler Inhale 2 puffs into the lungs every 6 (six) hours as needed for wheezing or shortness of breath.   allopurinol  (ZYLOPRIM ) 300 MG tablet take 1 tablet once daily.   amLODipine -olmesartan  (AZOR ) 10-40 MG tablet Take 1 tablet by mouth daily.   busPIRone (BUSPAR) 5 MG tablet Take 5 mg by mouth 2 (two) times daily.   diclofenac  Sodium (VOLTAREN ) 1 % GEL Apply 2 g topically 4 (four) times daily.   fluticasone  (FLONASE ) 50 MCG/ACT nasal spray Place 2 sprays into both nostrils daily.   gabapentin (NEURONTIN) 400 MG capsule Take 400 mg by mouth every 6 (six) hours.  GOODSENSE ASPIRIN  LOW DOSE 81 MG tablet take 1 tablet once daily.   hydrochlorothiazide  (HYDRODIURIL ) 12.5 MG tablet Take 1 tablet (12.5 mg total) by mouth daily. (Patient taking differently: Take 6.25 mg by mouth daily.)   hydrOXYzine  (VISTARIL ) 25 MG capsule take 1 capsule (25 milligram  total) by mouth at bedtime as needed.   ofloxacin  (FLOXIN ) 0.3 % OTIC solution Place 5 drops into the left ear twice daily for 7 days   oxyCODONE (ROXICODONE) 15 MG immediate release tablet Take 15 mg by mouth every 6 (six) hours.   rosuvastatin  (CRESTOR ) 10 MG tablet take 1 tablet (10 milligram total) by mouth at bedtime.   tadalafil  (CIALIS ) 20 MG tablet take ONE-HALF (1/2) to 1 tablet every other day as needed for erectile dysfunction   tamsulosin  (FLOMAX ) 0.4 MG CAPS capsule Take 1 capsule (0.4 mg total) by mouth daily. take 1 capsule (0.4 milligram total) by mouth daily.   testosterone  cypionate (DEPOTESTOSTERONE CYPIONATE) 200 MG/ML injection Inject 0.5 mLs (100 mg total) into the muscle every 7 (seven) days.   Vitamin D , Ergocalciferol , (DRISDOL ) 1.25 MG (50000 UNIT) CAPS capsule take 1 capsule (50,000 units total) by mouth every 7 (seven) days.   "

## 2024-03-07 NOTE — Patient Instructions (Addendum)
 Medication Instructions:   START Hydralazine  25mg  twice per day  *If you need a refill on your cardiac medications before your next appointment, please call your pharmacy*  Lab Work: Your physician recommends that you return for lab work today: renin-aldosterone, catecholamines, metanephrines   If you have labs (blood work) drawn today and your tests are completely normal, you will receive your results only by: MyChart Message (if you have MyChart) OR A paper copy in the mail If you have any lab test that is abnormal or we need to change your treatment, we will call you to review the results.  Testing/Procedures: Your EKG today looked great.  Follow-Up: At [facility], you and your health needs are our priority.  As part of our continuing mission to provide you with exceptional heart care, our providers are all part of one team.  This team includes your primary Cardiologist (physician) and Advanced Practice Providers or APPs (Physician Assistants and Nurse Practitioners) who all work together to provide you with the care you need, when you need it.  Your next appointment:   In 2-3 months with [facility]   We recommend signing up for the patient portal called MyChart.  Sign up information is provided on this After Visit Summary.  MyChart is used to connect with patients for Virtual Visits (Telemedicine).  Patients are able to view lab/test results, encounter notes, upcoming appointments, etc.  Non-urgent messages can be sent to your provider as well.   To learn more about what you can do with MyChart, go to [email or web address].   Other Instructions  If your dentist does not do dental device for sleep apnea, let us  know and we can refer you to a dentist who does!

## 2024-03-14 LAB — ALDOSTERONE + RENIN ACTIVITY W/ RATIO
Aldos/Renin Ratio: 0.1 (ref 0.0–20.0)
Aldosterone: 2.5 ng/dL (ref 0.0–30.0)
Renin Activity, Plasma: 26.387 ng/mL/h — ABNORMAL HIGH (ref 0.167–5.380)

## 2024-03-14 LAB — CATECHOLAMINES, FRACTIONATED, PLASMA
Dopamine: 25.8 pg/mL (ref 0.0–36.7)
Epinephrine: 176.7 pg/mL — ABNORMAL HIGH (ref 0.0–55.4)
Norepinephrine: 920 pg/mL — ABNORMAL HIGH (ref 115–524)

## 2024-03-14 LAB — METANEPHRINES, PLASMA
Metanephrine, Free: <25 pg/mL (ref 0.0–88.0)
Normetanephrine, Free: 112.8 pg/mL (ref 0.0–285.2)

## 2024-03-15 ENCOUNTER — Ambulatory Visit (HOSPITAL_BASED_OUTPATIENT_CLINIC_OR_DEPARTMENT_OTHER): Payer: Self-pay | Admitting: Family

## 2024-03-15 ENCOUNTER — Telehealth: Payer: Self-pay | Admitting: Family

## 2024-03-15 DIAGNOSIS — I10 Essential (primary) hypertension: Secondary | ICD-10-CM

## 2024-03-15 NOTE — Telephone Encounter (Signed)
-----   Message from Reche Finder, NP sent at 03/15/2024  7:52 AM EST ----- No evidence of hyperaldosteronism.  Elevated catecholamines, normal metanephrines.  Recommend 24-hour urine catecholamines for further evaluation of possible pheochromocytoma.

## 2024-03-15 NOTE — Telephone Encounter (Signed)
 The patient has been notified of the result and verbalized understanding.  All questions (if any) were answered.  Pt aware we will order for him to get a 24 hour urine catecholamines done for further evaluation of possible pheochromocytoma.   Pt aware I will place the order for this test in the system and release.  Pt aware to report to a [facility] to pick up his urine jug and then start the 24 hour urine collection thereafter.  Pt is aware after the 24 hour urine collection is complete, he will need to sharply drop this back off to [facility] for processing.   Pt is aware that his 24 hour urine must be kept in a cooler on ice during the collection process.   Pt is aware that I will send him the 24 hour urine collection instructions to his mychart for reference when collecting the specimen.   Pt verbalized understanding and agrees with this plan.

## 2024-03-15 NOTE — Telephone Encounter (Signed)
 Pt requesting a c/b to discuss his results.

## 2024-04-11 ENCOUNTER — Encounter (HOSPITAL_BASED_OUTPATIENT_CLINIC_OR_DEPARTMENT_OTHER)

## 2024-05-06 ENCOUNTER — Encounter (HOSPITAL_BASED_OUTPATIENT_CLINIC_OR_DEPARTMENT_OTHER): Admitting: Family

## 2024-05-24 ENCOUNTER — Other Ambulatory Visit

## 2024-06-07 ENCOUNTER — Ambulatory Visit: Admitting: Urology
